# Patient Record
Sex: Female | Born: 1945 | Race: White | Hispanic: No | Smoking: Former smoker
Health system: Southern US, Community
[De-identification: ages and names within clinical notes are randomized; demographics above are authoritative.]

## PROBLEM LIST (undated history)

## (undated) DIAGNOSIS — K529 Noninfective gastroenteritis and colitis, unspecified: Secondary | ICD-10-CM

## (undated) DIAGNOSIS — I509 Heart failure, unspecified: Secondary | ICD-10-CM

## (undated) DIAGNOSIS — F329 Major depressive disorder, single episode, unspecified: Secondary | ICD-10-CM

## (undated) DIAGNOSIS — M1990 Unspecified osteoarthritis, unspecified site: Secondary | ICD-10-CM

## (undated) DIAGNOSIS — M810 Age-related osteoporosis without current pathological fracture: Secondary | ICD-10-CM

## (undated) DIAGNOSIS — F32A Depression, unspecified: Secondary | ICD-10-CM

## (undated) DIAGNOSIS — K219 Gastro-esophageal reflux disease without esophagitis: Secondary | ICD-10-CM

## (undated) DIAGNOSIS — T7840XA Allergy, unspecified, initial encounter: Secondary | ICD-10-CM

## (undated) DIAGNOSIS — J40 Bronchitis, not specified as acute or chronic: Secondary | ICD-10-CM

## (undated) DIAGNOSIS — Z79890 Hormone replacement therapy (postmenopausal): Secondary | ICD-10-CM

## (undated) DIAGNOSIS — A048 Other specified bacterial intestinal infections: Secondary | ICD-10-CM

## (undated) DIAGNOSIS — F419 Anxiety disorder, unspecified: Secondary | ICD-10-CM

## (undated) DIAGNOSIS — K7460 Unspecified cirrhosis of liver: Secondary | ICD-10-CM

## (undated) DIAGNOSIS — I10 Essential (primary) hypertension: Secondary | ICD-10-CM

## (undated) DIAGNOSIS — R011 Cardiac murmur, unspecified: Secondary | ICD-10-CM

## (undated) DIAGNOSIS — Z933 Colostomy status: Secondary | ICD-10-CM

## (undated) DIAGNOSIS — N189 Chronic kidney disease, unspecified: Secondary | ICD-10-CM

## (undated) DIAGNOSIS — Z87442 Personal history of urinary calculi: Secondary | ICD-10-CM

## (undated) HISTORY — DX: Cardiac murmur, unspecified: R01.1

## (undated) HISTORY — DX: Noninfective gastroenteritis and colitis, unspecified: K52.9

## (undated) HISTORY — DX: Allergy, unspecified, initial encounter: T78.40XA

## (undated) HISTORY — DX: Age-related osteoporosis without current pathological fracture: M81.0

## (undated) HISTORY — DX: Other specified bacterial intestinal infections: A04.8

## (undated) HISTORY — DX: Essential (primary) hypertension: I10

## (undated) HISTORY — DX: Major depressive disorder, single episode, unspecified: F32.9

## (undated) HISTORY — PX: GANGLION CYST EXCISION: SHX1691

## (undated) HISTORY — DX: Depression, unspecified: F32.A

## (undated) HISTORY — DX: Chronic kidney disease, unspecified: N18.9

## (undated) HISTORY — DX: Heart failure, unspecified: I50.9

## (undated) HISTORY — DX: Colostomy status: Z93.3

## (undated) HISTORY — PX: LIVER BIOPSY: SHX301

## (undated) HISTORY — DX: Gastro-esophageal reflux disease without esophagitis: K21.9

## (undated) HISTORY — DX: Anxiety disorder, unspecified: F41.9

---

## 2002-09-09 ENCOUNTER — Other Ambulatory Visit: Admission: RE | Admit: 2002-09-09 | Discharge: 2002-09-09 | Payer: Self-pay | Admitting: Gynecology

## 2002-11-18 ENCOUNTER — Encounter: Payer: Self-pay | Admitting: Family Medicine

## 2002-11-18 ENCOUNTER — Encounter: Admission: RE | Admit: 2002-11-18 | Discharge: 2002-11-18 | Payer: Self-pay | Admitting: Family Medicine

## 2003-01-04 ENCOUNTER — Encounter: Payer: Self-pay | Admitting: Emergency Medicine

## 2003-01-04 ENCOUNTER — Inpatient Hospital Stay (HOSPITAL_COMMUNITY): Admission: EM | Admit: 2003-01-04 | Discharge: 2003-01-06 | Payer: Self-pay | Admitting: Emergency Medicine

## 2003-01-05 ENCOUNTER — Encounter: Payer: Self-pay | Admitting: General Surgery

## 2003-01-06 ENCOUNTER — Encounter: Payer: Self-pay | Admitting: General Surgery

## 2003-09-29 ENCOUNTER — Other Ambulatory Visit: Admission: RE | Admit: 2003-09-29 | Discharge: 2003-09-29 | Payer: Self-pay | Admitting: Gynecology

## 2004-11-08 ENCOUNTER — Other Ambulatory Visit: Admission: RE | Admit: 2004-11-08 | Discharge: 2004-11-08 | Payer: Self-pay | Admitting: Gynecology

## 2004-11-28 HISTORY — PX: ABDOMINAL HYSTERECTOMY: SHX81

## 2004-12-21 ENCOUNTER — Encounter (INDEPENDENT_AMBULATORY_CARE_PROVIDER_SITE_OTHER): Payer: Self-pay | Admitting: *Deleted

## 2004-12-21 ENCOUNTER — Observation Stay (HOSPITAL_COMMUNITY): Admission: RE | Admit: 2004-12-21 | Discharge: 2004-12-22 | Payer: Self-pay | Admitting: Gynecology

## 2005-11-08 ENCOUNTER — Other Ambulatory Visit: Admission: RE | Admit: 2005-11-08 | Discharge: 2005-11-08 | Payer: Self-pay | Admitting: Gynecology

## 2005-12-05 ENCOUNTER — Ambulatory Visit: Payer: Self-pay | Admitting: Family Medicine

## 2006-02-21 ENCOUNTER — Ambulatory Visit: Payer: Self-pay | Admitting: Family Medicine

## 2006-02-24 ENCOUNTER — Ambulatory Visit: Payer: Self-pay | Admitting: Cardiovascular Disease

## 2006-02-27 ENCOUNTER — Ambulatory Visit: Payer: Self-pay | Admitting: Internal Medicine

## 2006-11-13 ENCOUNTER — Other Ambulatory Visit: Admission: RE | Admit: 2006-11-13 | Discharge: 2006-11-13 | Payer: Self-pay | Admitting: Gynecology

## 2008-03-13 ENCOUNTER — Ambulatory Visit (HOSPITAL_BASED_OUTPATIENT_CLINIC_OR_DEPARTMENT_OTHER): Admission: RE | Admit: 2008-03-13 | Discharge: 2008-03-13 | Payer: Self-pay | Admitting: Orthopedic Surgery

## 2008-08-01 ENCOUNTER — Encounter: Admission: RE | Admit: 2008-08-01 | Discharge: 2008-08-01 | Payer: Self-pay | Admitting: Cardiology

## 2008-11-28 HISTORY — PX: FOOT SURGERY: SHX648

## 2011-04-12 NOTE — Op Note (Signed)
NAME:  Brianna Duncan, AKKERMAN NO.:  [id number]   MEDICAL RECORD NO.:  [id number]          PATIENT TYPE:  AMB   LOCATION:  DSC                          FACILITY:  [facility]   PHYSICIAN:  Rodney A. Mortenson, M.D.DATE OF BIRTH:  1946-05-08   DATE OF PROCEDURE:  03/13/2008  DATE OF DISCHARGE:                               OPERATIVE REPORT   HISTORY:  A 65 year old female with progressive bunion deformity of the  right great toe ensuing discomfort and pain.  She also has painful  condyles over the medial border of the PIP joint of the second toe and  DIP joint of third toe with secondary hypertrophic callus formation and  pain and discomfort.  Because of the persistent pain and discomfort  which has not responded to conservative care, she is now admitted for  surgical correction.  Complications discussed preoperatively.  Questions  were answered extensively and the patient wished to proceed.  The  patient has had some previous surgery on the foot.   PREOPERATIVE DIAGNOSES:  Bunion, right great toe; hypertrophic condyle,  proximal interphalangeal joint of right second toe; distal  interphalangeal joint, right third toe on medial side.   POSTOPERATIVE DIAGNOSIS:  Bunion, right great toe; hypertrophic condyle,  proximal interphalangeal joint of right second toe; distal  interphalangeal joint, right third toe on medial side.   OPERATION:  Silver bunionectomy; condylectomy, medial side proximal  interphalangeal joint, right second toe, and medial border distal  interphalangeal joint, right third toe.   SURGEON:  Lenard Galloway. Chaney Malling, MD   ANESTHESIA:  General.   PROCEDURE:  The patient was placed on the operating table in supine  position.  Pneumatic tourniquet was wrapped around the right upper  thigh.  The right lower extremity was prepped with DuraPrep and draped  in the usual manner.  Leg was wrapped out and an Esmarch tourniquet was  elevated.  Incision was made over the  PIP joint of the second toe and  DIP joint of third toe on the medial side.  A small retractor was put in  place.  Sharp dissection was carried down to the hypertrophic condyle at  each level.  Sharp bone cutting rongeurs were then used and partial  condylectomy was done.  This was then debrided further with a small  rongeur.  Complete decompression of the hypertrophic condyle was done at  both levels.  The skin was then closed with 3-0 nylon.  Attention was  then turned to the great toe.  Incision was made over the dorsomedial  aspect of right great toe.  Skin edges were retracted.  Care was taken  to avoid injury to this superficial cutaneous nerves.  Blunt dissection  was carried down to the condyle on very large exostosis.  A distally-  based flap of the capsule and bursa was then done.  This was flipped  distally and this exposed a very large exostosis.  Using a power saw,  this was amputated.  This was followed with a smooth rongeur, which  debrided the edges and decompressed the exostosis further.  Once this  was accomplished to my satisfaction, the joint  was irrigated with  copious amounts saline solution to remove all bony debris.  The medial  capsule flap was then brought back in anatomic position.  The toe was  aligned in neutral and 2-0 Vicryl suture was used to close the capsule.  This realigned the toe very nicely and decompress the large exostosis.  The skin was then closed with running 0 nylon suture.  A large bulky  pressure dressing was applied and the patient was returned to the  recovery room in excellent condition.  Technically, this procedure went  extremely well.   DISPOSITION:  1. Percocet for pain.  2. Usual postop instructions.  3. Will be on bunion shoe.  Weightbearing as tolerated.  4. Return to my office next week for followup exam.      Thereasa Distance A. Chaney Malling, M.D.  Electronically Signed     RAM/MEDQ  D:  03/13/2008  T:  03/14/2008  Job:  [id number]

## 2011-04-15 NOTE — H&P (Signed)
NAME:  Brianna Duncan, Brianna Duncan                    ACCOUNT NO.:  [id number]   MEDICAL RECORD NO.:  [id number]                   PATIENT TYPE:  EMS   LOCATION:  MINO                                 FACILITY:  [facility]   PHYSICIAN:  Angelia Mould. Derrell Lolling, M.D.             DATE OF BIRTH:  [birth year]   DATE OF ADMISSION:  01/04/2003  DATE OF DISCHARGE:                                HISTORY & PHYSICAL   CHIEF COMPLAINT:  Left chest pain.   HISTORY OF PRESENT ILLNESS:  This is a 65 year old white femalemale who fell  yesterday morning.  She was leaving the porch of her house, carrying a dog,  and fell backwards onto her back and left chest wall.  She complained of  immediate left chest pain and shortness of breath.  The shortness of breath  has resolved, but severe pleuritic left chest pain has continued.  She also  complained of some bruising and soreness of her left forearm that is mild.  Because the pain continued, she went to [facility] today.  She had  a chest x-ray today which reportedly showed a 30% left pneumothorax and a  rib fracture and she was sent to the . [facility]  Emergency Room for further evaluation.   She denies head trauma or headache, visual change.  She denies neck pain,  back pain, hip pain, pelvic pain, or lower extremity pain.  She denies  palpitations and also denies abdominal pain, nausea, or vomiting.  Her only  complaint is severe pain in the left chest wall.   PAST MEDICAL HISTORY:  She has had some episodes of bronchitis in the past,  but nothing recently.  She had a mole removed from her left anterior chest  wall a few days ago by Tinnie Gens A. Tawanna Cooler, M.D.  She denies a history of  cardiac disease, diabetes, asthma, or liver disease.  She had never had any  surgery other than the mole removal.   CURRENT MEDICATIONS:  None.   DRUG ALLERGIES:  IVP DYE.   FAMILY HISTORY:  Her mother died of heart disease.  Her father died of a  stroke.   SOCIAL HISTORY:  The patient is single.  She quit smoking in 1998.  She  drinks about three mixed drinks every weekend.  Her last tetanus shot was  this year.   REVIEW OF SYSTEMS:  All systems are reviewed.  They are noncontributory,  except as described above.   PHYSICAL EXAMINATION:  GENERAL APPEARANCE:  A thin, pleasant, white female  in moderate distress from left chest wall pain.  VITAL SIGNS:  Pulse 86, blood pressure 171/59, respirations 16 and  unlabored, temperature 97.5 degrees, oxygen saturation 98-99% on room air.  HEENT:  The sclerae are clear.  Extraocular movements intact.  ENT:  Oropharynx clear.  Teeth intact.  No trauma.  No facial bone fracture,  ecchymoses, or swelling.  External auditory canals are clear.  NECK:  Supple and nontender.  She has good active and passive range of  motion without pain.  There is no tenderness posteriorly.  CHEST:  There is tenderness and a little bit of crepitance on the left  anterolateral chest wall about the 9th and 10th rib areas and anterior  axillary line area.  There is no subcutaneous emphysema.  There is no  ecchymosis or swelling.  The sternum and clavicles are nontender.  The right  chest wall is nontender.  BACK:  The thoracic and lumbar spine are nontender.  LUNGS:  Breath sounds are actually quite good on both sides, perhaps  slightly diminished in intensity on the left.  HEART:  Regular rate and rhythm.  No murmur.  ABDOMEN:  Soft, nontender.  Active bowel sounds.  There is no hernia noted.  The liver and spleen are not enlarged.  PELVIS:  Stable and nontender.  GENITALIA:  There is no meatal blood.  RECTAL:  No blood externally.  EXTREMITIES:  Free range of motion without deformity or swelling.  There is  some ecchymoses around the left elbow, but this is really not tender and she  has excellent range of motion there.  NEUROLOGIC:  She is oriented x 4.  She had good memory for the event.  She  has normal motor and  sensory exam in all four extremities.  Speech is  normal.  VASCULAR:  She has excellent carotid, radial, and dorsalis pedis pulses  bilaterally.   LABORATORY DATA:  A repeat chest x-ray here in the . [facility] ER shows a less than 5% left pneumothorax and a left 10th rib  fracture posteriorly.  The urinalysis is clear.  The rest of her laboratory  work is pending.   IMPRESSION:  1. Fall.  2. Left rib fracture (#10) and possible left costochondral fractures.  3. Left pneumothorax, resolving radiographically.  4. Contusion of left elbow.   PLAN:  1. The patient will be admitted for pain control and oxygen monitoring.  2. We will x-ray the left elbow today.  3. Will repeat a PA and lateral chest x-ray in the morning to make sure that     she does not have recurrence of her pneumothorax.  4. I do not see any indication for chest tube at this time.                                               Angelia Mould. Derrell Lolling, M.D.    HMI/MEDQ  D:  01/04/2003  T:  01/04/2003  Job:  [id number]   cc:   Tinnie Gens A. Tawanna Cooler, M.D. [facility]  [address] [place]  [place] [address]  Fax: 1

## 2011-04-15 NOTE — H&P (Signed)
NAME:  Brianna Duncan, Brianna Duncan NO.:  [id number]   MEDICAL RECORD NO.:  [id number]          PATIENT TYPE:  AMB   LOCATION:  DAY                          [facility]ACILITY[facility]   PHYSICIAN:  Gretta Cool, M.D. DATE OF BIRTH:  [birth year]   DATE OF ADMISSION:  12/21/2004  DATE OF DISCHARGE:                                HISTORY & PHYSICAL   Ms. Goodspeed is a 65 year old gravida 1, para 1, who has been given  compounded estrogen, testosterone and a progesterone topical cream.  She has  taken that medication for a prolonged period of time.  She now presents with  a history of abnormal uterine bleeding that does not correlate with the  progesterone cream or anything else.  In essence, she has been on unopposed  oral estrogen without effective progesterone.  She had endometrial sampling  for evaluation of the abnormal uterine bleeding when ultrasound revealed a  very thickened endometrial lining suspicious of endometrial pathology.  Her  endometrium was 2.1 cm thick.  The pathology revealed a florid complex  hyperplasia with atypia, in essence a precancerous change.  She is now  admitted for definitive therapy by total abdominal hysterectomy and  bilateral salpingo-oophorectomy.   PAST MEDICAL HISTORY:  Usual childhood disease without sequelae.   Previous history of D&C for abnormal genital cytology in 1975.  She denies  significant medical illnesses.   FAMILY HISTORY:  Father died at age 58 of stroke.  Mother died at age 55 of  heart disease.  Mother had hypertension.  One brother has had coronary  artery disease with bypass, also is hypertensive.  Father was also type 2  diabetic.   ALLERGIES:  The patient has allergy to IVP DYE.   PRESENT PREMEDICATION:  Compounded progesterone cream, testosterone and  triacetate-compounded estrogen at high dose from Dr. Zenda Alpers in [facility].  Also taking a number of over-the-counter nonregulated vitamin  supplements, etc.   HABITS:  Denies tobacco or significant alcohol.   REVIEW OF SYSTEMS:  HEENT:  Denies symptoms.  CARDIORESPIRATORY:  Denies  asthma, cough, bronchitis, shortness of breath.  GASTROINTESTINAL/GENITOURINARY:  Denies frequency, urgency, dysuria, change  in bowel habits, food intolerance.  She has a past history of smoking.  She  also has a history of mitral valve prolapse and has dental prophylaxis for  procedures recommended by her medical doctor.   PHYSICAL EXAMINATION:  GENERAL:  A generally well-developed, well-nourished,  thin white female.  She is down one inch in height from baseline.  HEENT:  Pupils equal and react to light and accommodate.  Fundi not  examined.  Oropharynx clear.  NECK:  Supple without masses or thyroid enlargement.  CHEST:  Clear P to A.  BREASTS:  Soft without mass, nodes or nipple discharge.  ABDOMEN:  Soft without mass or organomegaly.  PELVIC:  External genitalia normal female.  Vagina clean, rugous.  The  cervix is parous, clean.  Uterus normal size, shape, contour.  Adnexa are  clear.  Rectovaginal confirms.   IMPRESSION:  1.  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  2.  Mitral valve prolapse.  3.  Strong family history of cardiovascular disease, stroke and diabetes.   PLAN:  I have recommended on to definitive therapy by total abdominal  hysterectomy, pathologic examination of the uterus, salpingo-oophorectomy,  possible extended therapy and node sampling if there is more than complex  hyperplasia with atypia.      CWL/MEDQ  D:  12/16/2004  T:  12/16/2004  Job:  [id number]   cc:   Tinnie Gens A. Tawanna Cooler, M.D. [facility]   [facility]  [address]  [place]  [place] [address]  Fax: [phone]

## 2011-04-15 NOTE — Discharge Summary (Signed)
NAME:  Brianna Duncan, Brianna Duncan                    ACCOUNT NO.:  [id number]   MEDICAL RECORD NO.:  [id number]                   PATIENT TYPE:  INP   LOCATION:  5707                                 FACILITY:  [facility]   PHYSICIAN:  Jimmye Norman, M.D.                   DATE OF BIRTH:  [birth year]   DATE OF ADMISSION:  DATE OF DISCHARGE:                                 DISCHARGE SUMMARY   TRAUMA SURGEON:  Angelia Mould. Derrell Lolling, M.D.   PRIMARY CARE PHYSICIAN:  Tinnie Gens A. Tawanna Cooler, M.D.   DISCHARGE DIAGNOSES:  1. Status post fall.  2. Left #10 rib fracture and possible left costochondral fractures.  3. Left pneumothorax.  4. Contusion left elbow.   HISTORY OF ADMISSION:  This is a 65 year old white female who fell on  01/03/03.  She was apparently leaving the porch of her home carrying a dog and  fell backwards onto her back and the left side of her chest.  She complained  immediately of left-sided chest pain and shortness of breath.  She continued  to have pleuritic left-sided chest pain and went to an urgent medical care  on 01/04/03 secondary to this.  A chest x-ray was done and reportedly showed a  30% left pneumothorax as well as a left rib fracture, and she was referred  to [facility] for further evaluation.  She is hemodynamic stable on  presentation to the ED with a pulse of 86, blood pressure 171/59,  respirations 16 and unlabored.  She is afebrile.  Her oxygen saturation on  room air is 98 to 99%.   PHYSICAL EXAMINATION:  Physical examination was essentially normal except  for some minimal crepitance over the left anterior lateral chest wall and  tenderness over this area to palpation.  She has no subcutaneous emphysema,  and her lungs are clear.  A repeat chest x-ray on admission showed a 5% left  pneumothorax and a left 10th rib fracture posteriorly.   HOSPITAL COURSE:  The patient was admitted for pain control and oxygen  monitoring as well as serial chest x-ray.  It was not  felt that chest tube  placement was indicated at this time.  The patient continued to remain  stable, and chest x-ray on 01/05/03 may have show a slightly larger, perhaps  around 10% left pneumothorax.  Secondary to this slight enlargement, it was  recommended that she continued to be monitored.  Chest x-ray on the day of  discharge, on 01/06/03, showed stable small pneumothorax.  Oxygen saturation  on room air was 98 to 100%, and the patient was having adequate pain control  on oral medications.  She was ambulatory and taking a regular diet without  difficulty.  It was felt that she could be discharged home at this time.   DISCHARGE MEDICATIONS:  Vicodin one to two p.o. q.4-6h p.r.n. pain.    DISCHARGE INSTRUCTIONS:  1. Activities are to  tolerance.  2. No working or driving until seen in followup by trauma service on 2/17 at     9:15 a.m.     Shawn Rayburn, P.A.                       Jimmye Norman, M.D.    SR/MEDQ  D:  01/06/2003  T:  01/06/2003  Job:  [id number]   cc:   Tinnie Gens A. Tawanna Cooler, M.D. [facility]  [address] [place]  [place] [address]  Fax: 1   Jimmye Norman III, M.D.  [address]  [place]  [place] [address]  Fax: [phone]

## 2011-04-15 NOTE — Op Note (Signed)
NAME:  Brianna Duncan, Brianna Duncan NO.:  [id number]   MEDICAL RECORD NO.:  [id number]          PATIENT TYPE:  OBV   LOCATION:  0447                         [facility]ACILITY:  [facility]   PHYSICIAN:  Gretta Cool, M.D. DATE OF BIRTH:  1946-04-26   DATE OF PROCEDURE:  12/21/2004  DATE OF DISCHARGE:                                 OPERATIVE REPORT   PREOPERATIVE DIAGNOSIS:  Atypical complex endometrial hyperplasia.   POSTOPERATIVE DIAGNOSIS:  Atypical complex endometrial hyperplasia.   PROCEDURES:  1.  Exploratory laparotomy.  2.  Total abdominal hysterectomy.  3.  Bilateral salpingo-oophorectomy.   SURGEON:  Gretta Cool, M.D.   ASSISTANT:  Raynald Kemp, M.D.   ANESTHESIA:  General orotracheal.   DESCRIPTION OF PROCEDURE:  Under excellent general anesthesia with the  patient prepped and draped in lithotomy position with PAS stockings and her  bladder drained, a Pfannenstiel incision was made and extended through the  fascia.  The peritoneum was then opened and abdomen explored.  There were no  abnormalities identified in the upper abdomen.  There was some significant  lobulation of both kidneys, both normal size, and no periaortic nodes or  pelvic adenopathy.  Examination of the pelvis revealed her uterus to be  normal in size, somewhat large for her age.  Both ovaries were quite  atrophic.  No evidence of endometriosis or other pelvic pathology.  At this  point, a self-retaining retractor was placed in the abdomen and bowel packed  away.  The uterus was grasped with Kelly clamps and elevated.  The round  ligaments were then transected and the posterior leaf of the broad ligament  opened over the infundibulopelvic vessels.  The vessels were then clamped,  cut, suture and tied with 0 Vicryl.  The anterior peritoneum was then  dissected across the surface of the uterus and the adventitial structures  pushed off the lower uterine segment.  The uterine vessels were then  skeletonized and ureter identified.  The vessels then clamped, cut, sutured  and tied with 0 Vicryl.  The cardinal and uterosacral ligaments were then  progressively clamped, cut, sutured and tied with 0 Vicryl.  The vaginal  cuff was then incised and the cervix removed.  The vaginal cuff was then  closed with a running suture of 0 Vicryl.  Careful inspection of the  pedicles revealed no significant bleeding.  A cardinal uterosacral  colposuspension was performed using 2-0 Novofil.  The pelvic floor was then  reperitonealized with a running suture of 2-0 Monocryl.  The pelvic floor  was then irrigated and the retractors were removed.  The omentum was pulled  down over the pelvis.  The abdominoperitoneum was closed with running suture  of 0 Monocryl.  The rectus muscle was then approximated with a running  suture of 0 Vicryl  from each angle of the midline.  Subcutaneous tissues approximated with  interrupted sutures of 2-0 Vicryl and the skin closed with skin staples and  Steri-Strips.  At the end of the procedure, sponge and lap counts were  correct.  There were no complications.  The patient returned to the recovery  room in excellent condition.      CWL/MEDQ  D:  12/21/2004  T:  12/21/2004  Job:  [id number]   cc:   Tinnie Gens A. Tawanna Cooler, M.D. [facility], M.D.  [place], [place]

## 2011-08-23 LAB — POCT HEMOGLOBIN-HEMACUE: Hemoglobin: 14.8

## 2012-01-18 ENCOUNTER — Ambulatory Visit (INDEPENDENT_AMBULATORY_CARE_PROVIDER_SITE_OTHER): Payer: Medicare Other | Admitting: Family Medicine

## 2012-01-18 VITALS — BP 130/74 | HR 76 | Temp 98.0°F | Resp 18 | Ht 59.0 in | Wt 138.6 lb

## 2012-01-18 DIAGNOSIS — J0100 Acute maxillary sinusitis, unspecified: Secondary | ICD-10-CM

## 2012-01-18 DIAGNOSIS — I10 Essential (primary) hypertension: Secondary | ICD-10-CM

## 2012-01-18 DIAGNOSIS — J329 Chronic sinusitis, unspecified: Secondary | ICD-10-CM

## 2012-01-18 MED ORDER — AZITHROMYCIN 250 MG PO TABS: ORAL_TABLET | ORAL | Status: DC

## 2012-01-18 MED ORDER — HYDROCODONE-HOMATROPINE 5-1.5 MG/5ML PO SYRP: 5.0000 mL | ORAL_SOLUTION | Freq: Three times a day (TID) | ORAL | Status: AC | PRN

## 2012-01-18 MED ORDER — FLUTICASONE PROPIONATE 50 MCG/ACT NA SUSP: 2.0000 | Freq: Every day | NASAL | Status: DC

## 2012-01-18 NOTE — Progress Notes (Signed)
  Subjective:    Patient ID: Brianna Duncan, female    DOB: Apr 08, 1946, 66 y.o.   MRN: [id number]  HPI Patient presents with 5 day history of facial congestion, post nasal drainage and cough.  Persistant cough worse at night.  Cough non productive  Some chest tightness without wheezing, hoarseness  Tactile fever  Feeling better today   Review of Systems     Objective:   Physical Exam  HENT:  Mouth/Throat: Oropharyngeal exudate: purulent pnd   Neck: Neck supple.  Cardiovascular: Normal rate, regular rhythm and normal heart sounds.   Pulmonary/Chest: Effort normal. Wheezes: slightly coarse BS.  Lymphadenopathy:    Cervical adenopathy: shoddy  ant nodes.  Neurological: She is alert.  Skin: Skin is warm.  Psychiatric: She has a normal mood and affect.           Assessment & Plan:   1. Sinusitis   2. HTN (hypertension)    See medications Rx on AVS Anticipatory guidance

## 2012-01-19 ENCOUNTER — Other Ambulatory Visit: Payer: Self-pay | Admitting: Family Medicine

## 2012-04-10 ENCOUNTER — Ambulatory Visit (INDEPENDENT_AMBULATORY_CARE_PROVIDER_SITE_OTHER): Payer: Medicare Other | Admitting: Family Medicine

## 2012-04-10 VITALS — BP 126/73 | HR 74 | Temp 98.0°F | Resp 16 | Ht 58.5 in | Wt 144.4 lb

## 2012-04-10 DIAGNOSIS — Z23 Encounter for immunization: Secondary | ICD-10-CM

## 2012-04-10 DIAGNOSIS — T148XXA Other injury of unspecified body region, initial encounter: Secondary | ICD-10-CM

## 2012-04-10 DIAGNOSIS — M79673 Pain in unspecified foot: Secondary | ICD-10-CM

## 2012-04-10 DIAGNOSIS — M79609 Pain in unspecified limb: Secondary | ICD-10-CM

## 2012-04-10 DIAGNOSIS — IMO0002 Reserved for concepts with insufficient information to code with codable children: Secondary | ICD-10-CM

## 2012-04-10 NOTE — Progress Notes (Signed)
  Patient Name: Brianna Duncan Date of Birth: 05/10/1946 Medical Record Number: [id number] Gender: female Date of Encounter: 04/10/2012  History of Present Illness:  Brianna Duncan is a 66 y.o. very pleasant female patient who presents with the following:  Here today to evaluate left heel pain.  A couple of weeks ago she was walking across a wood floor and got a splinter in her heel.  Her daughter pulled out what they thought was the whole thing- she did feel better for several days but then about 3 or 4 days ago the pain returned.  Otherwise she is feeling ok.  She is not quite sure of the date of her last tetanus shot, but she thinks it was several years ago.    There is no problem list on file for this patient.  No past medical history on file. No past surgical history on file. History  Substance Use Topics  . Smoking status: Never Smoker   . Smokeless tobacco: Not on file  . Alcohol Use: Yes     occassionally   No family history on file. Allergies  Allergen Reactions  . Ivp Dye (Iodinated Diagnostic Agents) Hives    Medication list has been reviewed and updated.  Review of Systems: As per HPI- otherwise negative. No heat, redness, or other signs of infection  Physical Examination: Filed Vitals:   04/10/12 0840  BP: 126/73  Pulse: 74  Temp: 98 F (36.7 C)  TempSrc: Oral  Resp: 16  Height: 4' 10.5" (1.486 m)  Weight: 144 lb 6.4 oz (65.499 kg)    Body mass index is 29.67 kg/(m^2).   GEN: WDWN, NAD, Non-toxic, Alert & Oriented x 3 HEENT: Atraumatic, Normocephalic.  Ears and Nose: No external deformity. EXTR: No clubbing/cyanosis/edema NEURO: Normal gait.  PSYCH: Normally interactive. Conversant. Not depressed or anxious appearing.  Calm demeanor.  In the left heel there is a visible superficial splinter. Removed by scraping with a 15 blade after betadine prep.  No other problems such as redness or purulence noted.    Assessment and Plan: 1. Splinter   Tdap vaccine greater than or equal to 7yo IM  2. Pain in foot     Splinter- removed.  Went over signs and symptoms of infection- let us know if any of these occur  Update tetanus shot

## 2012-04-25 ENCOUNTER — Other Ambulatory Visit: Payer: Self-pay | Admitting: Physician Assistant

## 2012-05-25 ENCOUNTER — Other Ambulatory Visit: Payer: Self-pay | Admitting: Physician Assistant

## 2012-05-26 ENCOUNTER — Other Ambulatory Visit: Payer: Self-pay | Admitting: Physician Assistant

## 2012-06-08 ENCOUNTER — Telehealth: Payer: Self-pay

## 2012-06-08 MED ORDER — AMLODIPINE BESYLATE 10 MG PO TABS: 10.0000 mg | ORAL_TABLET | Freq: Every day | ORAL | Status: DC

## 2012-06-08 MED ORDER — LISINOPRIL-HYDROCHLOROTHIAZIDE 20-12.5 MG PO TABS: 1.0000 | ORAL_TABLET | Freq: Every day | ORAL | Status: DC

## 2012-06-08 NOTE — Telephone Encounter (Signed)
PT NOTIFIED THAT RXS WILL BE SENT IN AND TO MAKE SURE SHE KEEPS APPT

## 2012-06-08 NOTE — Telephone Encounter (Signed)
Patient requests refill auth for blood pressure medication, enough to last until appointment with Dr. Neva Seat on 06/22/12. She has 2 days worth left. Please call (H):[phone] or (W - daytime): [phone] to advise if can be done.

## 2012-06-22 ENCOUNTER — Ambulatory Visit: Payer: Medicare Other | Admitting: Family Medicine

## 2012-06-29 ENCOUNTER — Ambulatory Visit: Payer: Medicare Other | Admitting: Family Medicine

## 2012-07-06 ENCOUNTER — Ambulatory Visit: Payer: Medicare Other | Admitting: Family Medicine

## 2012-07-11 ENCOUNTER — Telehealth: Payer: Self-pay

## 2012-07-11 MED ORDER — AMLODIPINE BESYLATE 10 MG PO TABS: 10.0000 mg | ORAL_TABLET | Freq: Every day | ORAL | Status: DC

## 2012-07-11 MED ORDER — LISINOPRIL-HYDROCHLOROTHIAZIDE 20-12.5 MG PO TABS: 1.0000 | ORAL_TABLET | Freq: Every day | ORAL | Status: DC

## 2012-07-11 NOTE — Telephone Encounter (Signed)
PT STATES THAT SHE HAS AN APPT NEXT WEEK AND WILL RUN OUT OF HER RX FOR HER B/P IN 3 DAYS. PT WOULD LIKE TO KNOW IF WE COULD CALL HER IN ENOUGH B/P MEDICATION TO LAST HER UNTIL HER APPT. [phone] (WORK)

## 2012-07-11 NOTE — Telephone Encounter (Signed)
Called patient back left message for her to call me, I need to know which meds and which pharmacy

## 2012-07-11 NOTE — Telephone Encounter (Signed)
Needs Amlodipine 10mg  and Lisinopril/ HCTZ 20/12.5 to [facility], has appt coming up, if approved no need to advise patient, she is aware they will be sent.

## 2012-07-11 NOTE — Telephone Encounter (Signed)
Done and sent in 

## 2012-07-20 ENCOUNTER — Encounter: Payer: Self-pay | Admitting: Family Medicine

## 2012-07-20 ENCOUNTER — Ambulatory Visit (INDEPENDENT_AMBULATORY_CARE_PROVIDER_SITE_OTHER): Payer: Medicare Other | Admitting: Family Medicine

## 2012-07-20 VITALS — BP 127/77 | HR 104 | Temp 97.8°F | Resp 20 | Ht 58.5 in | Wt 143.0 lb

## 2012-07-20 DIAGNOSIS — J309 Allergic rhinitis, unspecified: Secondary | ICD-10-CM

## 2012-07-20 DIAGNOSIS — I10 Essential (primary) hypertension: Secondary | ICD-10-CM

## 2012-07-20 DIAGNOSIS — Z23 Encounter for immunization: Secondary | ICD-10-CM

## 2012-07-20 LAB — BASIC METABOLIC PANEL
BUN: 22 mg/dL (ref 6–23)
Calcium: 10.3 mg/dL (ref 8.4–10.5)
Creat: 0.87 mg/dL (ref 0.50–1.10)

## 2012-07-20 MED ORDER — AMLODIPINE BESYLATE 10 MG PO TABS: 10.0000 mg | ORAL_TABLET | Freq: Every day | ORAL | Status: DC

## 2012-07-20 MED ORDER — LISINOPRIL-HYDROCHLOROTHIAZIDE 20-12.5 MG PO TABS: 1.0000 | ORAL_TABLET | Freq: Every day | ORAL | Status: DC

## 2012-07-20 NOTE — Progress Notes (Signed)
  Subjective:    Patient ID: Brianna Duncan, female    DOB: [birth year], 66 y.o.   MRN: [id number]  HPI Brianna Duncan is a 66 y.o. female  HTN - not checking home blood pressure readings.  No new side effects with meds.  Weight has increased with diet - less exercise recently.   Still some sinus pressure at times - improves with claritin D - has not taken flonase ns.   Would like a rx for zostavax. - has not had shingles. No vaccine   Not fasting - last ate at 1pm.   Review of Systems  Constitutional: Negative for fatigue.  Respiratory: Negative for chest tightness and shortness of breath.   Cardiovascular: Negative for chest pain, palpitations and leg swelling.  Gastrointestinal: Negative for abdominal pain and blood in stool.  Neurological: Negative for dizziness, syncope, light-headedness and headaches.       Objective:   Physical Exam  Constitutional: She is oriented to person, place, and time. She appears well-developed and well-nourished.  HENT:  Head: Normocephalic and atraumatic.  Eyes: Conjunctivae and EOM are normal. Pupils are equal, round, and reactive to light.  Neck: Carotid bruit is not present.  Cardiovascular: Normal rate, regular rhythm, normal heart sounds and intact distal pulses.   Pulmonary/Chest: Effort normal and breath sounds normal.  Abdominal: Soft. She exhibits no pulsatile midline mass. There is no tenderness.  Neurological: She is alert and oriented to person, place, and time.  Skin: Skin is warm and dry.  Psychiatric: She has a normal mood and affect. Her behavior is normal.          Assessment & Plan:  Brianna Duncan is a 66 y.o. female 1. HTN (hypertension)  Basic metabolic panel, amLODipine (NORVASC) 10 MG tablet, lisinopril-hydrochlorothiazide (PRINZIDE,ZESTORETIC) 20-12.5 MG per tablet  2. Allergic rhinitis    3. Need for shingles vaccine      HTN - discussed watching diet, increasing activity - pool or exercise bike  discussed. Check BMP. Cont same meds for now.   AR - restart flonase.  Avoid decongestants.  rtc if not improving.  Can try zyrtec or allegra in place of claritin if no relief.   Instructed to schedule welcome to medicare physical - fasting then for lipids.   Paper rx for Zostavax given for [facility] to give her this..  Pt will be scheduling follow up for colonoscopy (last one 5 years ago - due for recheck), and for pap testing- OBGYN - Pamelia Hoit at Dr. Johnn Hai office - will discuss need for continued pap testing.  She will be scheduling mammogram.   Patient Instructions  Your should receive a call or letter about your lab results within the next week to 10 days.  Call to schedule a welcome to medicare physical in the next 6 months. Be fasting for that visit so we can check cholesterol if needed. Stop decongestant, but can continue claritin, zyrtec, or allegra and restart flonase.  If not improving symptoms - recheck. Return to the clinic or go to the nearest emergency room if any of your symptoms worsen or new symptoms occur.

## 2012-07-20 NOTE — Progress Notes (Signed)
  Subjective:    Patient ID: Brianna Duncan, female    DOB: 1946-01-10, 66 y.o.   MRN: [id number]  HPI    Review of Systems     Objective:   Physical Exam        Assessment & Plan:  See last progress note.  Will likely need to schedule "Annual Wellness Visit" - "initial", not Welcome to [facility] Physical.

## 2012-07-20 NOTE — Patient Instructions (Signed)
Your should receive a call or letter about your lab results within the next week to 10 days.  Call to schedule a welcome to medicare physical in the next 6 months. Be fasting for that visit so we can check cholesterol if needed. Stop decongestant, but can continue claritin, zyrtec, or allegra and restart flonase.  If not improving symptoms - recheck. Return to the clinic or go to the nearest emergency room if any of your symptoms worsen or new symptoms occur.

## 2012-08-09 ENCOUNTER — Other Ambulatory Visit: Payer: Self-pay | Admitting: Gynecology

## 2013-02-14 ENCOUNTER — Other Ambulatory Visit: Payer: Self-pay | Admitting: Family Medicine

## 2013-02-25 ENCOUNTER — Ambulatory Visit (INDEPENDENT_AMBULATORY_CARE_PROVIDER_SITE_OTHER): Payer: Medicare Other | Admitting: Family Medicine

## 2013-02-25 VITALS — BP 122/74 | HR 72 | Temp 97.9°F | Resp 16 | Ht 59.0 in | Wt 150.0 lb

## 2013-02-25 DIAGNOSIS — J209 Acute bronchitis, unspecified: Secondary | ICD-10-CM

## 2013-02-25 DIAGNOSIS — J329 Chronic sinusitis, unspecified: Secondary | ICD-10-CM

## 2013-02-25 DIAGNOSIS — H1130 Conjunctival hemorrhage, unspecified eye: Secondary | ICD-10-CM

## 2013-02-25 DIAGNOSIS — R05 Cough: Secondary | ICD-10-CM

## 2013-02-25 DIAGNOSIS — H1131 Conjunctival hemorrhage, right eye: Secondary | ICD-10-CM

## 2013-02-25 DIAGNOSIS — I10 Essential (primary) hypertension: Secondary | ICD-10-CM

## 2013-02-25 MED ORDER — CEFDINIR 300 MG PO CAPS: 300.0000 mg | ORAL_CAPSULE | Freq: Two times a day (BID) | ORAL | Status: DC

## 2013-02-25 NOTE — Progress Notes (Signed)
[facility] [address] [place] [address] [phone]  Date:  02/25/2013   Name:  FREDERICK KLINGER   DOB:  04-17-1946   MRN:  [id number]  PCP:  No primary provider on file.    Chief Complaint: Eye Problem and Nasal Congestion   History of Present Illness:  ALIXANDREA MILLESON is a 67 y.o. very pleasant female patient who presents with the following:  Saturday am she awoke with a large "blood spot" in her right eye. For the last 2 weeks she has noted "sinus and cold."  She has been coughing a lot. She still has a lot of chest congestion, and did have headaches.  She has noted sinus pressure, the back of her neck is sore.  She is blowing a lot of congestion out of her nose.   She has used some sudafed.   Her right eye feels ok, no eye pain, vision is ok and unchanged.  No discharge or crusting from the eye.  She is wearing her contacts right now, she also has glasses  She may have noted a fever at the start of this illness, but this is now better.  No GI sympoms, she is able to eat normally   Her PCP is Dr. Silas [place], she also see an OBG  Patient Active Problem List  Diagnosis  . Unspecified essential hypertension    Past Medical History  Diagnosis Date  . Hypertension     Past Surgical History  Procedure Laterality Date  . Abdominal hysterectomy      History  Substance Use Topics  . Smoking status: Never Smoker   . Smokeless tobacco: Never Used  . Alcohol Use: Yes     Comment: occassionally    Family History  Problem Relation Age of Onset  . Heart disease Mother   . Stroke Father   . Stroke Sister   . Heart disease Brother     Allergies  Allergen Reactions  . Ivp Dye (Iodinated Diagnostic Agents) Hives    Medication list has been reviewed and updated.  Current Outpatient Prescriptions on File Prior to Visit  Medication Sig Dispense Refill  . ALPRAZolam (XANAX) 0.5 MG tablet Take 0.5 mg by mouth at bedtime as needed.       Marland Kitchen amLODipine (NORVASC) 10 MG tablet TAKE ONE TABLET BY MOUTH DAILY  90 tablet  0  . estradiol (VIVELLE-DOT) 0.075 MG/24HR Place 1 patch onto the skin 2 (two) times a week.      Marland Kitchen glucosamine-chondroitin 500-400 MG tablet Take 2 tablets by mouth once.      Marland Kitchen lisinopril-hydrochlorothiazide (PRINZIDE,ZESTORETIC) 20-12.5 MG per tablet TAKE ONE TABLET BY MOUTH DAILY  90 tablet  0  . Testosterone Propionate 2 % CREA Place onto the skin as needed.      . therapeutic multivitamin-minerals (THERAGRAN-M) tablet Take 1 tablet by mouth daily.      Marland Kitchen b complex vitamins tablet Take 1 tablet by mouth daily.      . calcium citrate-vitamin D 200-200 MG-UNIT TABS Take 1 tablet by mouth daily.      . fluticasone (FLONASE) 50 MCG/ACT nasal spray Place 2 sprays into the nose daily.  1 g  6   No current facility-administered medications on file prior to visit.    Review of Systems:  As per HPI- otherwise negative.   Physical Examination: Filed Vitals:   02/25/13 0908  BP: 122/74  Pulse: 72  Temp: 97.9 F (36.6 C)  Resp: 16   Filed Vitals:   02/25/13 0908  Height: 4\' 11"  (1.499 m)  Weight: 150 lb (68.04 kg)   Body mass index is 30.28 kg/(m^2). Ideal Body Weight: Weight in (lb) to have BMI = 25: 123.5  GEN: WDWN, NAD, Non-toxic, A & O x 3 HEENT: Atraumatic, Normocephalic. Neck supple. No masses, No LAD. Bilateral TM wnl, oropharynx normal.  PEERL,EOMI.   Nasal cavity is congested, sinuses are tender to percussion Right eye; there is a sunconjuctival hemorrhage around her iris, both below and above the iris. Normal eye movements, pupils are reactive, fundoscopic exam is normal Ears and Nose: No external deformity. CV: RRR, No M/G/R. No JVD. No thrill. No extra heart sounds. PULM: CTA B, no wheezes, crackles, rhonchi. No retractions. No resp. distress. No accessory muscle use. EXTR: No c/c/e NEURO Normal gait.  PSYCH: Normally interactive. Conversant. Not depressed or anxious appearing.  Calm  demeanor.    Assessment and Plan: Sinusitis - Plan: cefdinir (OMNICEF) 300 MG capsule  Unspecified essential hypertension  Acute bronchitis - Plan: cefdinir (OMNICEF) 300 MG capsule  Subconjunctival hemorrhage, right  Cough  HTN is controlled. Suspect subconjunctival hemorrhage is due to coughing. Reassurance and hand- out given.  Gave omnicef for bronchitis/ sinusitis.  See patient instructions for more details.     Signed Abbe Amsterdam, MD

## 2013-02-25 NOTE — Patient Instructions (Addendum)
Use delsym as needed for cough, and the omnicef antibiotic for your sinuses and bronchitis.  If you have any problems with your vision or any eye pain please let us know right away.  Otherwise, let me know if your cough is not improving in the next week or so.   Subconjunctival Hemorrhage A subconjunctival hemorrhage is a bright red patch covering a portion of the white of the eye. The white part of the eye is called the sclera, and it is covered by a thin membrane called the conjunctiva. This membrane is clear, except for tiny blood vessels that you can see with the naked eye. When your eye is irritated or inflamed and becomes red, it is because the vessels in the conjunctiva are swollen. Sometimes, a blood vessel in the conjunctiva can break and bleed. When this occurs, the blood builds up between the conjunctiva and the sclera, and spreads out to create a red area. The red spot may be very small at first. It may then spread to cover a larger part of the surface of the eye, or even all of the visible white part of the eye. In almost all cases, the blood will go away and the eye will become white again. Before completely dissolving, however, the red area may spread. It may also become brownish-yellow in color, before going away. If a lot of blood collects under the conjunctiva, it may look like a bulge on the surface of the eye. This looks scary, but it will also eventually flatten out and go away. Subconjunctival hemorrhages do not cause pain, but if swollen, may cause a feeling of irritation. There is no effect on vision.  CAUSES   The most common cause is mild trauma (rubbing the eye, irritation).  Subconjunctival hemorrhages can happen because of coughing or straining (lifting heavy objects), vomiting, or sneezing.  In some cases, your doctor may want to check your blood pressure. High blood pressure can also cause a sunconjunctival hemorrhage.  Severe trauma or blunt injuries.  Diseases that  affect blood clotting (hemophilia, leukemia).  Abnormalities of blood vessels behind the eye (carotid cavernous sinus fistula).  Tumors behind the eye.  Certain drugs (aspirin, coumadin, heparin).  Recent eye surgery. HOME CARE INSTRUCTIONS   Do not worry about the appearance of your eye. You may continue your usual activities.  Often, follow-up is not necessary. SEEK MEDICAL CARE IF:   Your eye becomes painful.  The bleeding does not disappear within 3 weeks.  Bleeding occurs elsewhere, for example, under the skin, in the mouth, or in the other eye.  You have recurring subconjunctival hemorrhages. SEEK IMMEDIATE MEDICAL CARE IF:   Your vision changes or you have difficulty seeing.  You develop severe headache, persistent vomiting, confusion, or abnormal drowsiness (lethargy).  Your eye seems to bulge or protrude from the eye socket.  You notice the sudden appearance of bruises, or have spontaneous bleeding elsewhere on your body. Document Released: 11/14/2005 Document Revised: 02/06/2012 Document Reviewed: 10/12/2009 [facility] Patient Information 2013 [place], [place].

## 2013-03-14 ENCOUNTER — Other Ambulatory Visit: Payer: Self-pay | Admitting: Family Medicine

## 2013-03-15 ENCOUNTER — Other Ambulatory Visit: Payer: Self-pay | Admitting: Physician Assistant

## 2013-05-11 ENCOUNTER — Other Ambulatory Visit: Payer: Self-pay | Admitting: Physician Assistant

## 2013-06-24 ENCOUNTER — Ambulatory Visit: Payer: Medicare Other

## 2013-06-24 ENCOUNTER — Ambulatory Visit (INDEPENDENT_AMBULATORY_CARE_PROVIDER_SITE_OTHER): Payer: Medicare Other | Admitting: Family Medicine

## 2013-06-24 VITALS — BP 136/78 | HR 103 | Temp 98.0°F | Resp 18 | Ht <= 58 in | Wt 149.0 lb

## 2013-06-24 DIAGNOSIS — R059 Cough, unspecified: Secondary | ICD-10-CM

## 2013-06-24 DIAGNOSIS — J189 Pneumonia, unspecified organism: Secondary | ICD-10-CM

## 2013-06-24 DIAGNOSIS — R0602 Shortness of breath: Secondary | ICD-10-CM

## 2013-06-24 DIAGNOSIS — R05 Cough: Secondary | ICD-10-CM

## 2013-06-24 DIAGNOSIS — R5381 Other malaise: Secondary | ICD-10-CM

## 2013-06-24 LAB — POCT CBC
HCT, POC: 41.6 % (ref 37.7–47.9)
Hemoglobin: 13.3 g/dL (ref 12.2–16.2)
Lymph, poc: 2.6 (ref 0.6–3.4)
MCH, POC: 32.2 pg — AB (ref 27–31.2)
MCHC: 32 g/dL (ref 31.8–35.4)
MCV: 100.8 fL — AB (ref 80–97)
POC Granulocyte: 15.7 — AB (ref 2–6.9)
WBC: 19.6 10*3/uL — AB (ref 4.6–10.2)

## 2013-06-24 MED ORDER — LEVOFLOXACIN 500 MG PO TABS: 500.0000 mg | ORAL_TABLET | Freq: Every day | ORAL | Status: DC

## 2013-06-24 NOTE — Progress Notes (Signed)
Subjective:    Patient ID: Brianna Duncan, female    DOB: August 22, 1946, 67 y.o.   MRN: [id number]  HPI Brianna Duncan is a 67 y.o. female  Croupy cough at night -past few weeks, feels like not taking deeper breath at night. Feels some drainage back of throat. Fatigue with this, and has been busy.  Felt nausea last night, dizzy once last night - funny feeling, improved with lying down. No chest pain, palpitations, slurred speech or focal weakness. No fever, but had some chills last night, one episode of sweats last night. No wt loss.  Dry cough. Slight heartburn - helps with nexium - only taken twice - last dose this am. Has had allergy symptoms this year - pnd, and pressure in head. Has had relief of this prior with claritin D. Has had some exertion with walking, but longstanding with weight gain.    other tx: delsym.    Treated in March with Omnicef for bronchitis.   Treated with Xanax prior by Dr. Johnn Hai office, but he is retiring - new Obgyn wants that medicine prescribed by primary provider. Takes xanax for anxiety only as needed - less than once per week. No other anxiety meds.  Has been on this medicine for years, no recent change in dose.   Hx of heart murmur - s/p cath with no blockages a few years.   Review of Systems  Constitutional: Positive for chills and fatigue. Negative for fever (subjective. ).  HENT: Positive for nosebleeds (yesterday. ) and postnasal drip. Negative for congestion.   Respiratory: Positive for cough. Negative for shortness of breath (feels like dosen't have full breath - for weeks. ).   Cardiovascular: Negative for chest pain, palpitations and leg swelling.  Neurological: Negative for syncope, facial asymmetry, speech difficulty and weakness.       Objective:   Physical Exam  Vitals reviewed. Constitutional: She is oriented to person, place, and time. She appears well-developed and well-nourished. No distress.  HENT:  Head: Normocephalic and  atraumatic.  Right Ear: Hearing, tympanic membrane, external ear and ear canal normal.  Left Ear: Hearing, tympanic membrane, external ear and ear canal normal.  Nose: Nose normal.  Mouth/Throat: Oropharynx is clear and moist. No oropharyngeal exudate.  Eyes: Conjunctivae and EOM are normal. Pupils are equal, round, and reactive to light.  Cardiovascular: Normal rate, regular rhythm, normal heart sounds and intact distal pulses.   No murmur heard. Pulmonary/Chest: Effort normal. No respiratory distress. She has no decreased breath sounds. She has no wheezes.    Neurological: She is alert and oriented to person, place, and time.  Skin: Skin is warm and dry. No rash noted.  Psychiatric: She has a normal mood and affect. Her behavior is normal.     [facility] reading (PRIMARY) by  Dr. Neva Seat: CXR: increased basilar mrkings - R greater than left with RLL infiltrate.  Stat overread of XRay: Findings: Opacity at the right lung base may represent early  pneumonia. There is scarring/atelectasis at the left lung base.  No edema or pleural fluid is seen. Heart size and mediastinal  contours are within normal limits.  IMPRESSION:  Opacity at the right lung base is suspicious for early pneumonia.   Results for orders placed in visit on 06/24/13  POCT CBC      Result Value Range   WBC 19.6 (*) 4.6 - 10.2 K/uL   Lymph, poc 2.6  0.6 - 3.4   POC LYMPH PERCENT 13.2  10 -  50 %L   MID (cbc) 1.3 (*) 0 - 0.9   POC MID % 6.7  0 - 12 %M   POC Granulocyte 15.7 (*) 2 - 6.9   Granulocyte percent 80.1 (*) 37 - 80 %G   RBC 4.13  4.04 - 5.48 M/uL   Hemoglobin 13.3  12.2 - 16.2 g/dL   HCT, POC 16.1  09.6 - 47.9 %   MCV 100.8 (*) 80 - 97 fL   MCH, POC 32.2 (*) 27 - 31.2 pg   MCHC 32.0  31.8 - 35.4 g/dL   RDW, POC 04.5     Platelet Count, POC 339  142 - 424 K/uL   MPV 9.5  0 - 99.8 fL       Assessment & Plan:  Brianna Duncan is a 67 y.o. female  Nocturnal cough, with leukocytosis. Progressive  fatigue. Suspected early RLL pna. Start levaquin 500mg  qd, recheck in 48 hours.  mucinex for cough.  Ok to take claritin otc for allergies/pnd.  Rtc/er precautions discussed.   Will check BNP with DOE/shortness of breath, but less likely CHF.   Meds ordered this encounter  Medications  . levofloxacin (LEVAQUIN) 500 MG tablet    Sig: Take 1 tablet (500 mg total) by mouth daily.    Dispense:  10 tablet    Refill:  0   Patient Instructions  Start antibiotic - if any new side effects such as change in mental status - return to discuss and consider change of medicine. Ok to take claritin and mucinex over the counter. Recheck in 2 days - after 5pm on Wednesday. Return to the clinic or go to the nearest emergency room if any of your symptoms worsen or new symptoms occur. Pneumonia, Adult Pneumonia is an infection of the lungs.  CAUSES Pneumonia may be caused by bacteria or a virus. Usually, these infections are caused by breathing infectious particles into the lungs (respiratory tract). SYMPTOMS   Cough.  Fever.  Chest pain.  Increased rate of breathing.  Wheezing.  Mucus production. DIAGNOSIS  If you have the common symptoms of pneumonia, your caregiver will typically confirm the diagnosis with a chest X-ray. The X-ray will show an abnormality in the lung (pulmonary infiltrate) if you have pneumonia. Other tests of your blood, urine, or sputum may be done to find the specific cause of your pneumonia. Your caregiver may also do tests (blood gases or pulse oximetry) to see how well your lungs are working. TREATMENT  Some forms of pneumonia may be spread to other people when you cough or sneeze. You may be asked to wear a mask before and during your exam. Pneumonia that is caused by bacteria is treated with antibiotic medicine. Pneumonia that is caused by the influenza virus may be treated with an antiviral medicine. Most other viral infections must run their course. These infections will  not respond to antibiotics.  PREVENTION A pneumococcal shot (vaccine) is available to prevent a common bacterial cause of pneumonia. This is usually suggested for:  People over 80 years old.  Patients on chemotherapy.  People with chronic lung problems, such as bronchitis or emphysema.  People with immune system problems. If you are over 65 or have a high risk condition, you may receive the pneumococcal vaccine if you have not received it before. In some countries, a routine influenza vaccine is also recommended. This vaccine can help prevent some cases of pneumonia.You may be offered the influenza vaccine as part of your care.  If you smoke, it is time to quit. You may receive instructions on how to stop smoking. Your caregiver can provide medicines and counseling to help you quit. HOME CARE INSTRUCTIONS   Cough suppressants may be used if you are losing too much rest. However, coughing protects you by clearing your lungs. You should avoid using cough suppressants if you can.  Your caregiver may have prescribed medicine if he or she thinks your pneumonia is caused by a bacteria or influenza. Finish your medicine even if you start to feel better.  Your caregiver may also prescribe an expectorant. This loosens the mucus to be coughed up.  Only take over-the-counter or prescription medicines for pain, discomfort, or fever as directed by your caregiver.  Do not smoke. Smoking is a common cause of bronchitis and can contribute to pneumonia. If you are a smoker and continue to smoke, your cough may last several weeks after your pneumonia has cleared.  A cold steam vaporizer or humidifier in your room or home may help loosen mucus.  Coughing is often worse at night. Sleeping in a semi-upright position in a recliner or using a couple pillows under your head will help with this.  Get rest as you feel it is needed. Your body will usually let you know when you need to rest. SEEK IMMEDIATE MEDICAL  CARE IF:   Your illness becomes worse. This is especially true if you are elderly or weakened from any other disease.  You cannot control your cough with suppressants and are losing sleep.  You begin coughing up blood.  You develop pain which is getting worse or is uncontrolled with medicines.  You have a fever.  Any of the symptoms which initially brought you in for treatment are getting worse rather than better.  You develop shortness of breath or chest pain. MAKE SURE YOU:   Understand these instructions.  Will watch your condition.  Will get help right away if you are not doing well or get worse. Document Released: 11/14/2005 Document Revised: 02/06/2012 Document Reviewed: 02/03/2011 [facility] Patient Information 2014 [place], [place].

## 2013-06-24 NOTE — Patient Instructions (Signed)
Start antibiotic - if any new side effects such as change in mental status - return to discuss and consider change of medicine. Ok to take claritin and mucinex over the counter. Recheck in 2 days - after 5pm on Wednesday. Return to the clinic or go to the nearest emergency room if any of your symptoms worsen or new symptoms occur. Pneumonia, Adult Pneumonia is an infection of the lungs.  CAUSES Pneumonia may be caused by bacteria or a virus. Usually, these infections are caused by breathing infectious particles into the lungs (respiratory tract). SYMPTOMS   Cough.  Fever.  Chest pain.  Increased rate of breathing.  Wheezing.  Mucus production. DIAGNOSIS  If you have the common symptoms of pneumonia, your caregiver will typically confirm the diagnosis with a chest X-ray. The X-ray will show an abnormality in the lung (pulmonary infiltrate) if you have pneumonia. Other tests of your blood, urine, or sputum may be done to find the specific cause of your pneumonia. Your caregiver may also do tests (blood gases or pulse oximetry) to see how well your lungs are working. TREATMENT  Some forms of pneumonia may be spread to other people when you cough or sneeze. You may be asked to wear a mask before and during your exam. Pneumonia that is caused by bacteria is treated with antibiotic medicine. Pneumonia that is caused by the influenza virus may be treated with an antiviral medicine. Most other viral infections must run their course. These infections will not respond to antibiotics.  PREVENTION A pneumococcal shot (vaccine) is available to prevent a common bacterial cause of pneumonia. This is usually suggested for:  People over 81 years old.  Patients on chemotherapy.  People with chronic lung problems, such as bronchitis or emphysema.  People with immune system problems. If you are over 65 or have a high risk condition, you may receive the pneumococcal vaccine if you have not received it  before. In some countries, a routine influenza vaccine is also recommended. This vaccine can help prevent some cases of pneumonia.You may be offered the influenza vaccine as part of your care. If you smoke, it is time to quit. You may receive instructions on how to stop smoking. Your caregiver can provide medicines and counseling to help you quit. HOME CARE INSTRUCTIONS   Cough suppressants may be used if you are losing too much rest. However, coughing protects you by clearing your lungs. You should avoid using cough suppressants if you can.  Your caregiver may have prescribed medicine if he or she thinks your pneumonia is caused by a bacteria or influenza. Finish your medicine even if you start to feel better.  Your caregiver may also prescribe an expectorant. This loosens the mucus to be coughed up.  Only take over-the-counter or prescription medicines for pain, discomfort, or fever as directed by your caregiver.  Do not smoke. Smoking is a common cause of bronchitis and can contribute to pneumonia. If you are a smoker and continue to smoke, your cough may last several weeks after your pneumonia has cleared.  A cold steam vaporizer or humidifier in your room or home may help loosen mucus.  Coughing is often worse at night. Sleeping in a semi-upright position in a recliner or using a couple pillows under your head will help with this.  Get rest as you feel it is needed. Your body will usually let you know when you need to rest. SEEK IMMEDIATE MEDICAL CARE IF:   Your illness becomes worse.  This is especially true if you are elderly or weakened from any other disease.  You cannot control your cough with suppressants and are losing sleep.  You begin coughing up blood.  You develop pain which is getting worse or is uncontrolled with medicines.  You have a fever.  Any of the symptoms which initially brought you in for treatment are getting worse rather than better.  You develop  shortness of breath or chest pain. MAKE SURE YOU:   Understand these instructions.  Will watch your condition.  Will get help right away if you are not doing well or get worse. Document Released: 11/14/2005 Document Revised: 02/06/2012 Document Reviewed: 02/03/2011 [facility] Patient Information 2014 [place], [place].

## 2013-06-26 ENCOUNTER — Ambulatory Visit (INDEPENDENT_AMBULATORY_CARE_PROVIDER_SITE_OTHER): Payer: Medicare Other | Admitting: Family Medicine

## 2013-06-26 VITALS — BP 130/72 | HR 95 | Temp 98.5°F | Resp 18 | Ht 60.0 in | Wt 149.0 lb

## 2013-06-26 DIAGNOSIS — F411 Generalized anxiety disorder: Secondary | ICD-10-CM

## 2013-06-26 DIAGNOSIS — J189 Pneumonia, unspecified organism: Secondary | ICD-10-CM

## 2013-06-26 LAB — POCT CBC
Granulocyte percent: 72.9 %G (ref 37–80)
MCV: 101.1 fL — AB (ref 80–97)
MID (cbc): 1.1 — AB (ref 0–0.9)
POC LYMPH PERCENT: 18.3 %L (ref 10–50)
POC MID %: 8.8 %M (ref 0–12)
Platelet Count, POC: 343 10*3/uL (ref 142–424)
RDW, POC: 13.8 %

## 2013-06-26 LAB — BRAIN NATRIURETIC PEPTIDE: Brain Natriuretic Peptide: 12.4 pg/mL (ref 0.0–100.0)

## 2013-06-26 MED ORDER — ALPRAZOLAM 0.5 MG PO TABS: 0.5000 mg | ORAL_TABLET | Freq: Every evening | ORAL | Status: DC | PRN

## 2013-06-26 NOTE — Progress Notes (Addendum)
Subjective:    Patient ID: Brianna Duncan, female    DOB: [birth year], 67 y.o.   MRN: [id number]  HPI Brianna Duncan is a 67 y.o. female  See ov 2 days ago - diagnosed with pneumonia - RLL,  Elevated WBC as below. S/p 2 doses of Levaquin - feeling a little nervousness, a little strange, slightly woozy yesterday, less woozy today.  Resting. Has taken Delsym - last taken yesterday, not needed for cough today. No fever/chills/rigors.  Still a dry cough. Eating a little less, but drinking ok.   Results for orders placed in visit on 06/24/13  BRAIN NATRIURETIC PEPTIDE      Result Value Range   Brain Natriuretic Peptide 12.4  0.0 - 100.0 pg/mL  POCT CBC      Result Value Range   WBC 19.6 (*) 4.6 - 10.2 K/uL   Lymph, poc 2.6  0.6 - 3.4   POC LYMPH PERCENT 13.2  10 - 50 %L   MID (cbc) 1.3 (*) 0 - 0.9   POC MID % 6.7  0 - 12 %M   POC Granulocyte 15.7 (*) 2 - 6.9   Granulocyte percent 80.1 (*) 37 - 80 %G   RBC 4.13  4.04 - 5.48 M/uL   Hemoglobin 13.3  12.2 - 16.2 g/dL   HCT, POC 13.0  86.5 - 47.9 %   MCV 100.8 (*) 80 - 97 fL   MCH, POC 32.2 (*) 27 - 31.2 pg   MCHC 32.0  31.8 - 35.4 g/dL   RDW, POC 78.4     Platelet Count, POC 339  142 - 424 K/uL   MPV 9.5  0 - 99.8 fL    See last ov re intermittent use of xanax.     Review of Systems  Respiratory: Positive for cough. Negative for shortness of breath (none at rest, but with exertion -  no interval worsening. ).   Cardiovascular: Negative for chest pain.       Objective:   Physical Exam  Vitals reviewed. Constitutional: She is oriented to person, place, and time. She appears well-developed and well-nourished. No distress.  HENT:  Head: Normocephalic and atraumatic.  Right Ear: Hearing, tympanic membrane, external ear and ear canal normal.  Left Ear: Hearing, tympanic membrane, external ear and ear canal normal.  Nose: Nose normal.  Mouth/Throat: Oropharynx is clear and moist. No oropharyngeal exudate.  Eyes:  Conjunctivae and EOM are normal. Pupils are equal, round, and reactive to light.  Cardiovascular: Normal rate, regular rhythm, normal heart sounds and intact distal pulses.   No murmur heard. Pulmonary/Chest: Not tachypneic. No respiratory distress. She has no wheezes. She has rhonchi (few coarse bs - RLL>LLL.) in the right lower field and the left lower field. She has no rales.  Neurological: She is alert and oriented to person, place, and time.  Skin: Skin is warm and dry. No rash noted.  Psychiatric: She has a normal mood and affect. Her behavior is normal.    Results for orders placed in visit on 06/26/13  POCT CBC      Result Value Range   WBC 12.0 (*) 4.6 - 10.2 K/uL   Lymph, poc 2.2  0.6 - 3.4   POC LYMPH PERCENT 18.3  10 - 50 %L   MID (cbc) 1.1 (*) 0 - 0.9   POC MID % 8.8  0 - 12 %M   POC Granulocyte 8.7 (*) 2 - 6.9   Granulocyte percent 72.9  37 -  80 %G   RBC 3.97 (*) 4.04 - 5.48 M/uL   Hemoglobin 12.5  12.2 - 16.2 g/dL   HCT, POC 16.1  09.6 - 47.9 %   MCV 101.1 (*) 80 - 97 fL   MCH, POC 31.5 (*) 27 - 31.2 pg   MCHC 31.2 (*) 31.8 - 35.4 g/dL   RDW, POC 04.5     Platelet Count, POC 343  142 - 424 K/uL   MPV 9.2  0 - 99.8 fL         Assessment & Plan:  Brianna Duncan is a 67 y.o. female Overall tolerating antibiotic, improved leukocytosis. Stable o2 sat. Continue current regimen.  Plan on recheck in 5 days as long as improving. Sooner if worse or if tolerance/side effects with levaquin  rtc precautions.   Anxiety - situational - see last ov.  rx for xanax given as discussed.  Meds ordered this encounter  Medications  . ALPRAZolam (XANAX) 0.5 MG tablet    Sig: Take 1 tablet (0.5 mg total) by mouth at bedtime as needed for anxiety.    Dispense:  30 tablet    Refill:  1   Patient Instructions  Recheck Monday night as long as long as you are improving. Return to the clinic or go to the nearest emergency room if any of your symptoms worsen or new symptoms  occur. If any new or worsening side effects with antibiotic - let me know.

## 2013-06-26 NOTE — Patient Instructions (Addendum)
Recheck Monday night as long as long as you are improving. Return to the clinic or go to the nearest emergency room if any of your symptoms worsen or new symptoms occur. If any new or worsening side effects with antibiotic - let me know.

## 2013-07-01 ENCOUNTER — Ambulatory Visit (INDEPENDENT_AMBULATORY_CARE_PROVIDER_SITE_OTHER): Payer: Medicare Other | Admitting: Family Medicine

## 2013-07-01 VITALS — BP 128/76 | HR 86 | Temp 98.1°F | Resp 18 | Ht 60.0 in | Wt 148.0 lb

## 2013-07-01 DIAGNOSIS — J189 Pneumonia, unspecified organism: Secondary | ICD-10-CM

## 2013-07-01 NOTE — Progress Notes (Signed)
  Subjective:    Patient ID: Brianna Duncan, female    DOB: 09/16/1946, 67 y.o.   MRN: [id number]  HPI Brianna Duncan is a 67 y.o. female  Dx with RLL Pneumonia - 06/24/13, with elevated WBC then.  Started on Levaquin. Improved leukocytosis on 06/26/13 follow up.   Feeling better since last ov.   Slight cough - lessening, and dry cough. Less "funny feelings" with Levaquin - has about 4 left. No fever. Not short of breath at rest - notes minimal sx's with exertion only. O2 sats in 94-95 range.   Review of Systems  Constitutional: Negative for appetite change (drinking fluids ok. ).  Respiratory: Positive for cough and shortness of breath (as above - exertion only. ). Negative for wheezing.   Cardiovascular: Negative for chest pain.   As above.     Objective:   Physical Exam  Vitals reviewed. Constitutional: She is oriented to person, place, and time. She appears well-developed and well-nourished. No distress.  HENT:  Head: Normocephalic and atraumatic.  Right Ear: Hearing, tympanic membrane, external ear and ear canal normal.  Left Ear: Hearing, tympanic membrane, external ear and ear canal normal.  Nose: Nose normal.  Mouth/Throat: Oropharynx is clear and moist. No oropharyngeal exudate.  Eyes: Conjunctivae and EOM are normal. Pupils are equal, round, and reactive to light.  Cardiovascular: Normal rate, regular rhythm, normal heart sounds and intact distal pulses.   No murmur heard. Pulmonary/Chest: Effort normal. No respiratory distress. She has no decreased breath sounds. She has no wheezes. She has rhonchi. She has no rales.  Few coarse breath sounds RLL>LLL. Normal air mvmt, no distress.   Neurological: She is alert and oriented to person, place, and time.  Skin: Skin is warm and dry. No rash noted.  Psychiatric: She has a normal mood and affect. Her behavior is normal.       Assessment & Plan:  Brianna Duncan is a 67 y.o. female RLL pneumonia  Improving  symptomatically.  May have small amount of atelectasis with few coarse breath sounds, but these are stable, and afebrile. o2 sat stable. Continue Levaquin for 10 day course for recheck in 5 days to make sure is continuing to improve off abx, with likely recheck CBC at that time.  rtc precautions.   Patient Instructions  Recheck this weekend. Return to the clinic or go to the nearest emergency room if any of your symptoms worsen or new symptoms occur. Finish antibiotic as instructed.

## 2013-07-01 NOTE — Patient Instructions (Signed)
Recheck this weekend. Return to the clinic or go to the nearest emergency room if any of your symptoms worsen or new symptoms occur. Finish antibiotic as instructed.

## 2013-07-06 ENCOUNTER — Ambulatory Visit (INDEPENDENT_AMBULATORY_CARE_PROVIDER_SITE_OTHER): Payer: Medicare Other | Admitting: Family Medicine

## 2013-07-06 ENCOUNTER — Ambulatory Visit: Payer: Medicare Other

## 2013-07-06 VITALS — BP 130/74 | HR 81 | Temp 98.0°F | Resp 16 | Ht <= 58 in | Wt 149.0 lb

## 2013-07-06 DIAGNOSIS — R5381 Other malaise: Secondary | ICD-10-CM

## 2013-07-06 DIAGNOSIS — R5383 Other fatigue: Secondary | ICD-10-CM

## 2013-07-06 DIAGNOSIS — J189 Pneumonia, unspecified organism: Secondary | ICD-10-CM

## 2013-07-06 DIAGNOSIS — R05 Cough: Secondary | ICD-10-CM

## 2013-07-06 DIAGNOSIS — R059 Cough, unspecified: Secondary | ICD-10-CM

## 2013-07-06 LAB — POCT CBC
Granulocyte percent: 63 %G (ref 37–80)
HCT, POC: 44.9 % (ref 37.7–47.9)
Hemoglobin: 14 g/dL (ref 12.2–16.2)
Lymph, poc: 2.5 (ref 0.6–3.4)
MCHC: 31.2 g/dL — AB (ref 31.8–35.4)
POC Granulocyte: 5.7 (ref 2–6.9)

## 2013-07-06 NOTE — Patient Instructions (Addendum)
Your xray and blood count appear improved. Should have slow, but steady improvement in energy level, but if any worsening or fever  - recheck with me. Plan on repeat office visit in 5 weeks for follow up chest x ray.  Return to the clinic or go to the nearest emergency room if any of your symptoms worsen or new symptoms occur.

## 2013-07-06 NOTE — Progress Notes (Signed)
Subjective:    Patient ID: Brianna Duncan, female    DOB: Dec 09, 1945, 67 y.o.   MRN: [id number]  HPI  Brianna Duncan is a 67 y.o. female Diagnosed with RLL pneumonia - initial WBC 19.6 on CBC.  Started on Levaquin.  See follow up office visits.   Feels better. Still feels weak, but stronger than last ov.  notices sweaty feeling or short of breath with exertion.  Last dose of Levaquin yesterday. No wheeze. Less cough. No fever, but hasn't checked temp. No chest pains. Less nevous sxs  Review of Systems  Constitutional: Positive for fatigue. Negative for fever, chills and appetite change (eating and drinking normally. ).  Respiratory: Positive for cough and shortness of breath. Negative for wheezing and stridor.   Cardiovascular: Negative for chest pain and leg swelling.       Objective:   Physical Exam  Vitals reviewed. Constitutional: She is oriented to person, place, and time. She appears well-developed and well-nourished. No distress.  HENT:  Head: Normocephalic and atraumatic.  Right Ear: Hearing, tympanic membrane, external ear and ear canal normal.  Left Ear: Hearing, tympanic membrane, external ear and ear canal normal.  Nose: Nose normal.  Mouth/Throat: Oropharynx is clear and moist. No oropharyngeal exudate.  Eyes: Conjunctivae and EOM are normal. Pupils are equal, round, and reactive to light.  Cardiovascular: Normal rate, regular rhythm, normal heart sounds and intact distal pulses.   No murmur heard. Pulmonary/Chest: Effort normal. No respiratory distress. She has no decreased breath sounds. She has no wheezes. She has rhonchi (min RLL coarse bs. normal effort, no distress. ) in the right lower field. She has no rales.  Neurological: She is alert and oriented to person, place, and time.  Skin: Skin is warm and dry. No rash noted.  Psychiatric: She has a normal mood and affect. Her behavior is normal.   Filed Vitals:   07/06/13 1109  BP: 130/74  Pulse: 81   Temp: 98 F (36.7 C)  Resp: 16  initial O2 sat - 91% - repeated - 95-97% RA.   Results for orders placed in visit on 07/06/13  POCT CBC      Result Value Range   WBC 9.0  4.6 - 10.2 K/uL   Lymph, poc 2.5  0.6 - 3.4   POC LYMPH PERCENT 28.2  10 - 50 %L   MID (cbc) 0.8  0 - 0.9   POC MID % 8.8  0 - 12 %M   POC Granulocyte 5.7  2 - 6.9   Granulocyte percent 63.0  37 - 80 %G   RBC 4.45  4.04 - 5.48 M/uL   Hemoglobin 14.0  12.2 - 16.2 g/dL   HCT, POC 78.2  95.6 - 47.9 %   MCV 100.9 (*) 80 - 97 fL   MCH, POC 31.5 (*) 27 - 31.2 pg   MCHC 31.2 (*) 31.8 - 35.4 g/dL   RDW, POC 21.3     Platelet Count, POC 350  142 - 424 K/uL   MPV 8.6  0 - 99.8 fL   [facility] reading (PRIMARY) by  Dr. Neva Seat: CXR: interval improvement in RLL markings.     Assessment & Plan:  Brianna Duncan is a 67 y.o. female RLL pneumonia - Plan: POCT CBC, DG Chest 2 View  Cough - Plan: POCT CBC, DG Chest 2 View  Other malaise and fatigue  Improved pneumonia.  S/p Levaquin x 10 days. Ambulatory pulse ox - 95-96%.  CBC  reassuring.  Cont sx care, relative rest,  Recheck for possible XR in 5 weeks - sooner if any worsening.   Patient Instructions  Your xray and blood count appear improved. Should have slow, but steady improvement in energy level, but if any worsening or fever  - recheck with me. Plan on repeat office visit in 5 weeks for follow up chest x ray.  Return to the clinic or go to the nearest emergency room if any of your symptoms worsen or new symptoms occur.

## 2013-08-12 ENCOUNTER — Other Ambulatory Visit: Payer: Self-pay | Admitting: Physician Assistant

## 2013-08-12 ENCOUNTER — Ambulatory Visit (INDEPENDENT_AMBULATORY_CARE_PROVIDER_SITE_OTHER): Payer: Medicare Other | Admitting: Family Medicine

## 2013-08-12 ENCOUNTER — Ambulatory Visit: Payer: Medicare Other

## 2013-08-12 ENCOUNTER — Encounter: Payer: Self-pay | Admitting: Family Medicine

## 2013-08-12 VITALS — BP 120/72 | HR 82 | Temp 98.5°F | Resp 16 | Ht 58.5 in | Wt 147.6 lb

## 2013-08-12 DIAGNOSIS — J189 Pneumonia, unspecified organism: Secondary | ICD-10-CM

## 2013-08-12 DIAGNOSIS — Z23 Encounter for immunization: Secondary | ICD-10-CM

## 2013-08-12 DIAGNOSIS — I10 Essential (primary) hypertension: Secondary | ICD-10-CM

## 2013-08-12 MED ORDER — PNEUMOCOCCAL VAC POLYVALENT 25 MCG/0.5ML IJ INJ: 0.5000 mL | INJECTION | INTRAMUSCULAR | Status: DC

## 2013-08-12 MED ORDER — LISINOPRIL-HYDROCHLOROTHIAZIDE 20-12.5 MG PO TABS: ORAL_TABLET | ORAL | Status: DC

## 2013-08-12 MED ORDER — AMLODIPINE BESYLATE 10 MG PO TABS: ORAL_TABLET | ORAL | Status: DC

## 2013-08-12 MED ORDER — PNEUMOCOCCAL 13-VAL CONJ VACC IM SUSP: 0.5000 mL | INTRAMUSCULAR | Status: DC

## 2013-08-12 NOTE — Patient Instructions (Addendum)
Plan on repeat chest xray in 3 months. If your cough does not continue to improve, or worsens sooner - return for recheck sooner.  Return to the clinic or go to the nearest emergency room if any of your symptoms worsen or new symptoms occur. Plan on medicare physical in the next 6 months. You can call to schedule this.  You should receive a call or letter about your lab results within the next week to 10 days.

## 2013-08-12 NOTE — Progress Notes (Signed)
Subjective:    Patient ID: Brianna Duncan, female    DOB: 09-01-1946, 67 y.o.   MRN: [id number]  HPI EMILLEE Duncan is a 67 y.o. female  HTN - takes Novasc 10mg  QD, zestoretic 20/12.5mg  QD. No outside/home BP's. Peptic ulcer 30 years ago, but no recent PUD.  No new chest pains, dyspnea.  No lightheadedness or orthostatic sx's. Creatinine 0.83 in 06/2012.   Dx with RLL Pneumonia - 06/24/13, Started on Levaquin, 10 day course.  Improved leukocytosis on 06/26/13 follow up. Improved/stable off abx at follow up. Energy level much better.  Minimal cough at  Night only, not during day, and is slowly improving. No fevers.  No chest pains, no unexplained wt loss. Minimal shortness of breath with exertion - has gained some weight plast 6 months, less active.  Thinks due to this. No LE edema. BNP WNL at 06/24/13 ov.  07/06/13 CXR report:  Clinical Data: Follow-up right lower lobe pneumonia.  CHEST - 2 VIEW  Comparison: 06/24/2013  Findings: The cardiomediastinal silhouette is unremarkable.  Right lower lung opacity has resolved.  Mild left basilar atelectasis/scarring is again identified.  There is no evidence of focal airspace disease, pulmonary edema,  suspicious pulmonary nodule/mass, pleural effusion, or  pneumothorax.  No acute bony abnormalities are identified.  IMPRESSION:  Resolved right lower lung pneumonia.  No acute abnormalities identified.  Clinically significant discrepancy from primary report, if  provided: None  Has not had pneumovax in the past, or flu vaccine this year. No recent CPE.   Had Zostavax 06/2012.        Review of Systems  Constitutional: Negative for fatigue and unexpected weight change.  Respiratory: Positive for cough. Negative for chest tightness and wheezing.   Cardiovascular: Negative for chest pain, palpitations and leg swelling.  Gastrointestinal: Negative for abdominal pain and blood in stool.  Neurological: Negative for dizziness, syncope,  light-headedness and headaches.       Objective:   Physical Exam  Vitals reviewed. Constitutional: She is oriented to person, place, and time. She appears well-developed and well-nourished.  HENT:  Head: Normocephalic and atraumatic.  Eyes: Conjunctivae and EOM are normal. Pupils are equal, round, and reactive to light.  Neck: Carotid bruit is not present.  Cardiovascular: Normal rate, regular rhythm, normal heart sounds and intact distal pulses.   Pulmonary/Chest: Effort normal.  Overall clear with normal effort, few faint rales LLL only. No rhonchi/wheeze.   Abdominal: Soft. She exhibits no pulsatile midline mass. There is no tenderness.  Neurological: She is alert and oriented to person, place, and time.  Skin: Skin is warm and dry.  Psychiatric: She has a normal mood and affect. Her behavior is normal.   [facility] reading (PRIMARY) by  Dr. Neva Seat: CXR: few increased markings -LLL, unchanged.  No discrete infiltrate.       Assessment & Plan:  Brianna Duncan is a 67 y.o. female Pneumonia - Plan: DG Chest 2 View, CANCELED: DG Chest 1 View - RLL PNA appears resolved, few rales LLL - persistent atelectasis vs bronchiectasis, likely post infectious cough. Plan on repeat CXR in 3 months.   HTN - stable - refilled zestoretic and norvasc at current doses.   Need for prophylactic vaccination and inoculation against influenza - Plan: Flu Vaccine QUAD 36+ mos IM given.  Need for prophylactic vaccination against Streptococcus pneumoniae (pneumococcus) - Plan: Pneumococcal polysaccharide vaccine 23-valent greater than or equal to 2yo subcutaneous/IM - given.   Health Maintenance -  Plan on [facility]  physical in next 6 months.  Pneumovax and Flu vaccines given today.  UTD on Zostavax.   Meds ordered this encounter  . lisinopril-hydrochlorothiazide (PRINZIDE,ZESTORETIC) 20-12.5 MG per tablet    Sig: TAKE ONE TABLET BY MOUTH ONCE DAILY    Dispense:  90 tablet    Refill:  1  . amLODipine  (NORVASC) 10 MG tablet    Sig: TAKE ONE TABLET BY MOUTH ONCE DAILY    Dispense:  90 tablet    Refill:  1   Patient Instructions  Plan on repeat chest xray in 3 months. If your cough does not continue to improve, or worsens sooner - return for recheck sooner.  Return to the clinic or go to the nearest emergency room if any of your symptoms worsen or new symptoms occur. Plan on medicare physical in the next 6 months. You can call to schedule this.  You should receive a call or letter about your lab results within the next week to 10 days.

## 2013-08-13 LAB — BASIC METABOLIC PANEL
BUN: 18 mg/dL (ref 6–23)
Chloride: 105 mEq/L (ref 96–112)
Glucose, Bld: 131 mg/dL — ABNORMAL HIGH (ref 70–99)
Potassium: 4.2 mEq/L (ref 3.5–5.3)

## 2013-08-19 ENCOUNTER — Encounter: Payer: Self-pay | Admitting: Family Medicine

## 2013-09-30 ENCOUNTER — Other Ambulatory Visit: Payer: Self-pay | Admitting: Family Medicine

## 2013-09-30 DIAGNOSIS — F411 Generalized anxiety disorder: Secondary | ICD-10-CM

## 2013-11-11 ENCOUNTER — Other Ambulatory Visit: Payer: Self-pay | Admitting: Family Medicine

## 2013-11-15 ENCOUNTER — Other Ambulatory Visit: Payer: Self-pay | Admitting: Family Medicine

## 2013-12-16 ENCOUNTER — Other Ambulatory Visit: Payer: Self-pay | Admitting: Family Medicine

## 2013-12-16 DIAGNOSIS — F418 Other specified anxiety disorders: Secondary | ICD-10-CM

## 2013-12-17 NOTE — Telephone Encounter (Signed)
Will refill today, planned to recheck CXR at 3 months at last ov in September, so recommend repeat ov in next month if possible.

## 2013-12-18 NOTE — Telephone Encounter (Signed)
LMOM w/Dr Greene's message.

## 2013-12-18 NOTE — Telephone Encounter (Signed)
Called in Rx

## 2014-01-27 ENCOUNTER — Ambulatory Visit (INDEPENDENT_AMBULATORY_CARE_PROVIDER_SITE_OTHER): Payer: Medicare Other | Admitting: Family Medicine

## 2014-01-27 ENCOUNTER — Ambulatory Visit: Payer: Medicare Other

## 2014-01-27 ENCOUNTER — Encounter: Payer: Self-pay | Admitting: Family Medicine

## 2014-01-27 VITALS — BP 111/93 | HR 113 | Temp 98.6°F | Resp 18 | Ht 58.75 in | Wt 155.4 lb

## 2014-01-27 DIAGNOSIS — F339 Major depressive disorder, recurrent, unspecified: Secondary | ICD-10-CM

## 2014-01-27 DIAGNOSIS — R05 Cough: Secondary | ICD-10-CM

## 2014-01-27 DIAGNOSIS — I10 Essential (primary) hypertension: Secondary | ICD-10-CM

## 2014-01-27 DIAGNOSIS — J189 Pneumonia, unspecified organism: Secondary | ICD-10-CM

## 2014-01-27 DIAGNOSIS — R059 Cough, unspecified: Secondary | ICD-10-CM

## 2014-01-27 DIAGNOSIS — J309 Allergic rhinitis, unspecified: Secondary | ICD-10-CM

## 2014-01-27 DIAGNOSIS — F418 Other specified anxiety disorders: Secondary | ICD-10-CM

## 2014-01-27 MED ORDER — LISINOPRIL-HYDROCHLOROTHIAZIDE 20-12.5 MG PO TABS: ORAL_TABLET | ORAL | Status: DC

## 2014-01-27 MED ORDER — AMLODIPINE BESYLATE 10 MG PO TABS: ORAL_TABLET | ORAL | Status: DC

## 2014-01-27 MED ORDER — ALPRAZOLAM 0.5 MG PO TABS: 0.2500 mg | ORAL_TABLET | Freq: Two times a day (BID) | ORAL | Status: DC | PRN

## 2014-01-27 MED ORDER — SERTRALINE HCL 50 MG PO TABS: 50.0000 mg | ORAL_TABLET | Freq: Every day | ORAL | Status: DC

## 2014-01-27 NOTE — Progress Notes (Addendum)
Subjective:    Patient ID: Brianna Duncan, female    DOB: [birth year], 68 y.o.   MRN: [id number]  Authored by [name], MD.   HPI Brianna Duncan is a 68 y.o. female Pt is here for a fu appointment.  Initially diagnosed with right lower pneumonia on 06/24/13.  Treated with Levaquin.  Persistent atelectasis vs bronchiectasis with suspected post infectious cough.  Last seen in September.  Chest x-ray report at that visit didn't not show acute disease but findings above.    Pt states at night when she is laying down she has a non productive persistent cough.  She thinks it is drainage.  She has tried Mucinex allergy OTC medication with some relief.  Pt states she has the cough during the day, but it is worse at night.  Pt denies having the cough before her diagnosis of pneumonia.  Pt denies fever and night sweats different than her normal menopausal sweats.  Pt also complains of sneezing and rhinorrhea.  Pt states she hasn't been using a nasal spray.    Pt also states she needs her medications refilled.  Pt denies checking her BP outside of visits.     Pt had a complete hysterectomy for benign reason.   Takes Xanax 0.5 mg nightly.  Pt states she has been depressed lately.  Pt states the depression started several years ago.  Pt states she was taking Celexa, but she stopped taking it in November because it wasn't working.  Pt denies talking to a counselor or therapist and denies any interest in talking to one.  She states if she did visit a therapist she would prefer a female.  Pt states her daughter takes Zoloft and has been successful.  Pt states she drinks about 2 glasses of wine a day.  She use to drink on the weekends, but states she now drinks during the week.  Pt denies suicidal thoughts.  Pt states her daughter has had three surgeries in one year and her boss passed away.     Patient Active Problem List   Diagnosis Date Noted  . Unspecified essential hypertension 02/25/2013    Past Surgical History  Procedure Laterality Date  . Abdominal hysterectomy    . Foot surgery      right foot, left great toe and left second toe surgery   Allergies  Allergen Reactions  . Ivp Dye [Iodinated Diagnostic Agents] Hives   History   Social History  . Marital Status: Divorced    Spouse Name: N/A    Number of Children: N/A  . Years of Education: N/A   Occupational History  . Not on file.   Social History Main Topics  . Smoking status: Former Research scientist (life sciences)  . Smokeless tobacco: Never Used     Comment: quit 13 yrs ago  . Alcohol Use: Yes     Comment: occassionally  . Drug Use: No  . Sexual Activity: Yes    Birth Control/ Protection: None   Other Topics Concern  . Not on file   Social History Narrative  . No narrative on file     Review of Systems  Constitutional: Negative for fever and chills.  HENT: Positive for sneezing. Negative for congestion, ear pain and sore throat.   Respiratory: Positive for cough. Negative for chest tightness and shortness of breath.   Cardiovascular: Negative for chest pain.  Gastrointestinal: Negative for nausea, vomiting, abdominal pain and diarrhea.  Musculoskeletal: Negative for back pain.  Skin: Negative  for color change and rash.  Neurological: Negative for dizziness, syncope and headaches.  Psychiatric/Behavioral: Negative for behavioral problems and confusion.       Objective:   Physical Exam  Nursing note and vitals reviewed. Constitutional: She is oriented to person, place, and time. She appears well-developed and well-nourished. No distress.  HENT:  Head: Normocephalic and atraumatic.  Right Ear: Ear canal normal.  Left Ear: Ear canal normal.  Nose: Nose normal. Right sinus exhibits no maxillary sinus tenderness and no frontal sinus tenderness. Left sinus exhibits no maxillary sinus tenderness and no frontal sinus tenderness.  Mouth/Throat: Uvula is midline and oropharynx is clear and moist. No oropharyngeal  exudate, posterior oropharyngeal edema or posterior oropharyngeal erythema.  Slight edema of turbinates.  TM pearly gray bilaterally.  Eyes: Conjunctivae and EOM are normal. Right eye exhibits no discharge. Left eye exhibits no discharge.  Neck: Neck supple. No tracheal deviation present.  Cardiovascular: Normal rate, regular rhythm and normal heart sounds.  Exam reveals no gallop and no friction rub.   No murmur heard. Pulmonary/Chest: Effort normal and breath sounds normal. No respiratory distress. She has no wheezes. She has no rales. She exhibits no tenderness.  Few faint course breath sounds right lower lobe only.  Abdominal: Soft. She exhibits no distension.  Musculoskeletal: Normal range of motion. She exhibits no edema and no tenderness.  Neurological: She is alert and oriented to person, place, and time.  Skin: Skin is warm and dry. No rash noted.  Psychiatric: She has a normal mood and affect. Her behavior is normal.    Filed Vitals:   01/27/14 1626  BP: 111/93  Pulse: 113  Temp: 98.6 F (37 C)  TempSrc: Oral  Resp: 18  Height: 4' 10.75" (1.492 m)  Weight: 155 lb 6.4 oz (70.489 kg)  SpO2: 98%   [facility] reading (PRIMARY) by  Dr. Carlota Raspberry: CXR: ? Atelectasis RLL. Brianna Duncan from prior.      Assessment & Plan:   [name] is a 68 y.o. female Pneumonia - Plan: DG Chest 2 View - stable. Resolved PNA and suspect some atelectasis. PND from [place] triggering cough likely.  If cough not improving with allergy treatment - consider further imaging or trial off ACE-I.   HTN (hypertension) - Plan: lisinopril-hydrochlorothiazide (PRINZIDE,ZESTORETIC) 20-12.5 MG per tablet, amLODipine (NORVASC) 10 MG tablet refilled. Plan on labs, including renal fxn next ov in few weeks.   Depression, major, recurrent - Plan: sertraline (ZOLOFT) 50 MG tablet, with Situational anxiety - Plan: ALPRAZolam (XANAX) 0.5 MG tablet.  Years of depression sx's admitted today.  Prior trial of Celexa, but as no  relief, will try Zoloft as daughter has had success with this. Recheck in 2-3 weeks with labs at that time. Advised to cut back to 1 drink per day and caution with BDZ d/t side effects/added side effects. Phone numbers for counseling.   Allergic rhinitis - trial of otc flonase. If not improved, consider less sedating antihistamine, but discussed anticholinergic SE's of these.   Meds ordered this encounter  Medications  . sertraline (ZOLOFT) 50 MG tablet    Sig: Take 1 tablet (50 mg total) by mouth daily.    Dispense:  30 tablet    Refill:  3  . lisinopril-hydrochlorothiazide (PRINZIDE,ZESTORETIC) 20-12.5 MG per tablet    Sig: TAKE ONE TABLET BY MOUTH ONCE DAILY    Dispense:  90 tablet    Refill:  1  . amLODipine (NORVASC) 10 MG tablet    Sig: TAKE  ONE TABLET BY MOUTH ONCE DAILY    Dispense:  90 tablet    Refill:  1  . ALPRAZolam (XANAX) 0.5 MG tablet    Sig: Take 0.5-1 tablets (0.25-0.5 mg total) by mouth 2 (two) times daily as needed for anxiety.    Dispense:  30 tablet    Refill:  0   Patient Instructions  Recheck with Dr. Carlota Raspberry in 3 weeks, and will need to check bloodwork at that time.  Start flonase nasal spray, and if cough not improving - will look at other causes.  Counseling:  Nunzio Cobbs or Arvil Chaco   [phone]  Return to the clinic or go to the nearest emergency room if any of your symptoms worsen or new symptoms occur.   I personally performed the services described in this documentation, which was scribed in my presence. The recorded information has been reviewed and considered, and addended by me as needed.

## 2014-01-27 NOTE — Patient Instructions (Signed)
Recheck with Dr. Carlota Raspberry in 3 weeks, and will need to check bloodwork at that time.  Start flonase nasal spray, and if cough not improving - will look at other causes.  Counseling:  Nunzio Cobbs or Arvil Chaco   [phone]  Return to the clinic or go to the nearest emergency room if any of your symptoms worsen or new symptoms occur.

## 2014-01-28 NOTE — Progress Notes (Signed)
Left message for patient regarding 3/16 @ 4:15 for a follow up with Dr Carlota Raspberry.

## 2014-02-03 ENCOUNTER — Telehealth: Payer: Self-pay | Admitting: Family Medicine

## 2014-02-03 NOTE — Telephone Encounter (Signed)
Called and left vm for patient to give [place] a call bk to schedule a 3wk fu w/doctor Carlota Raspberry

## 2014-03-17 ENCOUNTER — Ambulatory Visit: Payer: Medicare Other | Admitting: Family Medicine

## 2014-03-25 ENCOUNTER — Other Ambulatory Visit: Payer: Self-pay | Admitting: Family Medicine

## 2014-03-25 DIAGNOSIS — G4700 Insomnia, unspecified: Secondary | ICD-10-CM

## 2014-03-27 ENCOUNTER — Telehealth: Payer: Self-pay

## 2014-03-27 NOTE — Telephone Encounter (Signed)
Pt is calling to check statue of refill request for alprazolam, states [facility] on [address] has sent in the refill request

## 2014-03-27 NOTE — Telephone Encounter (Signed)
Refilled. Keep appt in June with me.

## 2014-04-02 ENCOUNTER — Telehealth: Payer: Self-pay

## 2014-04-02 ENCOUNTER — Ambulatory Visit (INDEPENDENT_AMBULATORY_CARE_PROVIDER_SITE_OTHER): Payer: Medicare Other | Admitting: Family Medicine

## 2014-04-02 VITALS — BP 130/70 | HR 118 | Temp 98.7°F | Resp 16 | Ht <= 58 in | Wt 154.0 lb

## 2014-04-02 DIAGNOSIS — R000 Tachycardia, unspecified: Secondary | ICD-10-CM

## 2014-04-02 DIAGNOSIS — I10 Essential (primary) hypertension: Secondary | ICD-10-CM

## 2014-04-02 DIAGNOSIS — R05 Cough: Secondary | ICD-10-CM

## 2014-04-02 DIAGNOSIS — R42 Dizziness and giddiness: Secondary | ICD-10-CM

## 2014-04-02 DIAGNOSIS — R002 Palpitations: Secondary | ICD-10-CM

## 2014-04-02 DIAGNOSIS — R0989 Other specified symptoms and signs involving the circulatory and respiratory systems: Secondary | ICD-10-CM

## 2014-04-02 DIAGNOSIS — F32A Depression, unspecified: Secondary | ICD-10-CM

## 2014-04-02 DIAGNOSIS — R7309 Other abnormal glucose: Secondary | ICD-10-CM

## 2014-04-02 DIAGNOSIS — R739 Hyperglycemia, unspecified: Secondary | ICD-10-CM

## 2014-04-02 DIAGNOSIS — F3289 Other specified depressive episodes: Secondary | ICD-10-CM

## 2014-04-02 DIAGNOSIS — Z9181 History of falling: Secondary | ICD-10-CM

## 2014-04-02 DIAGNOSIS — R059 Cough, unspecified: Secondary | ICD-10-CM

## 2014-04-02 DIAGNOSIS — F329 Major depressive disorder, single episode, unspecified: Secondary | ICD-10-CM

## 2014-04-02 LAB — POCT CBC
GRANULOCYTE PERCENT: 64.3 % (ref 37–80)
HCT, POC: 41.4 % (ref 37.7–47.9)
Hemoglobin: 13.5 g/dL (ref 12.2–16.2)
Lymph, poc: 2.1 (ref 0.6–3.4)
MCH, POC: 31.7 pg — AB (ref 27–31.2)
MCHC: 32.6 g/dL (ref 31.8–35.4)
MCV: 97.1 fL — AB (ref 80–97)
MID (CBC): 1.1 — AB (ref 0–0.9)
MPV: 10.1 fL (ref 0–99.8)
PLATELET COUNT, POC: 258 10*3/uL (ref 142–424)
POC GRANULOCYTE: 5.7 (ref 2–6.9)
POC LYMPH %: 23.2 % (ref 10–50)
POC MID %: 12.5 %M — AB (ref 0–12)
RBC: 4.26 M/uL (ref 4.04–5.48)
RDW, POC: 14.2 %
WBC: 8.9 10*3/uL (ref 4.6–10.2)

## 2014-04-02 LAB — TSH: TSH: 2.184 u[IU]/mL (ref 0.350–4.500)

## 2014-04-02 LAB — GLUCOSE, POCT (MANUAL RESULT ENTRY): POC Glucose: 111 mg/dl — AB (ref 70–99)

## 2014-04-02 LAB — POCT GLYCOSYLATED HEMOGLOBIN (HGB A1C): Hemoglobin A1C: 5.2

## 2014-04-02 NOTE — Patient Instructions (Addendum)
Cut back on alcohol - no more than 1 glass of wine per day, and do not combine with xanax.  Again, I recommend meeting with a counselor/therapist as names given last time and family counselor is also helpful.  I will refer you to neurologist and schedule a ct scan of your head.   If any increased dizziness, or falls - go to emergency room.  We will schedule a carotid doppler, and refer you back to Dr. Einar Gip.  If any worsening of symptoms - return here or emergency room.  Recheck with me in next few weeks.   You should receive a call or letter about your lab results within the next week to 10 days.

## 2014-04-02 NOTE — Telephone Encounter (Signed)
She has nausea vomiting dizziness Unable to hold the walk without holding the wall Memory loss ?'s dementia Slurred speech Bad fam h/o MI and CVA Pt's daughter is going to bring her mom back in tonight

## 2014-04-02 NOTE — Telephone Encounter (Signed)
ANN STATES SHE WOULD LIKE TO SPEAK WITH DR Carlota Raspberry OR HIS ASST WHO IS GOING TO BE ATTENTIVE TO HER MOM AND LISTEN TO WHAT SHE HAVE TO SAY NEED A PHONE CALL BEFORE THE NIGHT IS OVER. [employer] [phone]

## 2014-04-02 NOTE — Progress Notes (Addendum)
Subjective:    Patient ID: Brianna Duncan, female    DOB: [birth year], 68 y.o.   MRN: [id number] This chart was scribed for Meredith Staggers, MD by Evon Slack, ED Scribe. This Patient was seen in room 14 and the patients care was started at 4:22 PM  HPI  Brianna Duncan is a 68 y.o. female is returning for a follow up for cough.  History RLL pneumonia jul 2014 persistent marking on chest x ray with atelectasis  States that cough is completely gone and relieved of all similar symptoms after taking Flonase nasal  spray  HTN Kidney function normal, creatinine 0.05 August 2013 States her blood pressure was good today. She states for years that she has been feeling palpations from her heart. Has seen cardiologist in past and had normal cath years ago. No recent changes in palpitations   Hyperglycemia  Glucose 131 September 2014.  Rechecking today.   Depression takes Xanax most nights, noted to have years of depression symptoms that she recently admitted to last visit. Started on Zoloft 50 mg January 27 2014 plan to recheck in 2 weeks also was given phone numbers for counseling. She states that's she went to [place] and stopped taking her Zoloft on 03/06/14. She states that when she stopped taking she started feeling light headed and dizziness. She States that she has not talked with any of the counselors recommended. Has not taken Zoloft recently.  She states that she feels fine now and is not feeling depressed. Does not feel like she needs to be on Zoloft at this time.dizziness resolved past few weeks.   Pt denies weakness in arm or legs, slurred speech, or any new weakness   Past Medical History  Diagnosis Date  . Hypertension   . Allergy   . Anxiety   . Osteoporosis    Past Surgical History  Procedure Laterality Date  . Abdominal hysterectomy    . Foot surgery      right foot, left great toe and left second toe surgery   Prior to Admission medications   Medication Sig  Start Date End Date Taking? Authorizing Provider  ALPRAZolam Prudy Feeler) 0.5 MG tablet TAKE ONE TABLET BY MOUTH AT BEDTIME AS NEEDED FOR ANXIETY   Yes Shade Flood, MD  amLODipine (NORVASC) 10 MG tablet TAKE ONE TABLET BY MOUTH ONCE DAILY 01/27/14  Yes Shade Flood, MD  b complex vitamins tablet Take 1 tablet by mouth daily.   Yes Historical Provider, MD  calcium citrate-vitamin D 200-200 MG-UNIT TABS Take 1 tablet by mouth daily.   Yes Historical Provider, MD  estradiol (ESTRACE VAGINAL) 0.1 MG/GM vaginal cream Place 2 g vaginally once a week.   Yes Historical Provider, MD  estradiol (VIVELLE-DOT) 0.075 MG/24HR Place 1 patch onto the skin 2 (two) times a week.   Yes Historical Provider, MD  glucosamine-chondroitin 500-400 MG tablet Take 2 tablets by mouth once.   Yes Historical Provider, MD  lisinopril-hydrochlorothiazide (PRINZIDE,ZESTORETIC) 20-12.5 MG per tablet TAKE ONE TABLET BY MOUTH ONCE DAILY 01/27/14  Yes Shade Flood, MD  sertraline (ZOLOFT) 50 MG tablet Take 1 tablet (50 mg total) by mouth daily. 01/27/14  Yes Shade Flood, MD  Testosterone Propionate 2 % CREA Place onto the skin as needed.   Yes Historical Provider, MD  therapeutic multivitamin-minerals (THERAGRAN-M) tablet Take 1 tablet by mouth daily.   Yes Historical Provider, MD  cholecalciferol (VITAMIN D) 1000 UNITS tablet Take 1,000 Units by mouth daily.  Historical Provider, MD  fluticasone (FLONASE) 50 MCG/ACT nasal spray Place 2 sprays into the nose daily. 01/18/12 01/17/13  Hayden Rasmussen, MD   Allergies  Allergen Reactions  . Ivp Dye [Iodinated Diagnostic Agents] Hives   History   Social History  . Marital Status: Divorced    Spouse Name: N/A    Number of Children: N/A  . Years of Education: N/A   Occupational History  . Not on file.   Social History Main Topics  . Smoking status: Former Research scientist (life sciences)  . Smokeless tobacco: Never Used     Comment: quit 13 yrs ago  . Alcohol Use: Yes     Comment:  occassionally  . Drug Use: No  . Sexual Activity: Yes    Birth Control/ Protection: None   Other Topics Concern  . Not on file   Social History Narrative  . No narrative on file   Review of Systems  Respiratory: Negative for cough and shortness of breath.   Cardiovascular: Positive for palpitations. Negative for chest pain and leg swelling.  Neurological: Positive for dizziness and light-headedness.  Psychiatric/Behavioral: Negative for dysphoric mood.       Objective:   Filed Vitals:   04/02/14 1601  BP: 130/70  Pulse: 118  Temp: 98.7 F (37.1 C)  TempSrc: Oral  Resp: 16  Height: 4\' 10"  (1.473 m)  Weight: 154 lb (69.854 kg)  SpO2: 93%     Physical Exam  Nursing note and vitals reviewed. Constitutional: She is oriented to person, place, and time. She appears well-developed and well-nourished. No distress.  HENT:  Head: Normocephalic and atraumatic.  Eyes: EOM are normal.  Neck: Neck supple. Carotid bruit is present (faint bruit on right. ). No tracheal deviation present.  Cardiovascular:  Murmur heard. Tachycardiac with 6-9/6 systolic murmur - heard at LUSB.   Pulmonary/Chest: Effort normal and breath sounds normal. No respiratory distress. She has no wheezes.  Musculoskeletal: Normal range of motion.  Trace pidal edema non pitting  Neurological: She is alert and oriented to person, place, and time. She has normal strength. No cranial nerve deficit. Coordination normal.  No focal weakness, negative pronator drift, negative romberg. Grossly equal strength.   Skin: Skin is warm and dry.  Psychiatric: She has a normal mood and affect. Her behavior is normal.   EKG: sinus tachycardia, rate 111.  Nonspecific ST waves anterolaterally, but no other significant change from 02/2008.   Paper chart review:  OV from Dr. Einar Gip 07/2008 - hypertensive heart dz.  Tortuosity of coronary arteries, no renal artery stenosis. Normal abdominal aorta.      Results for orders placed  in visit on 04/02/14  GLUCOSE, POCT (MANUAL RESULT ENTRY)      Result Value Ref Range   POC Glucose 111 (*) 70 - 99 mg/dl  POCT GLYCOSYLATED HEMOGLOBIN (HGB A1C)      Result Value Ref Range   Hemoglobin A1C 5.2    POCT CBC      Result Value Ref Range   WBC 8.9  4.6 - 10.2 K/uL   Lymph, poc 2.1  0.6 - 3.4   POC LYMPH PERCENT 23.2  10 - 50 %L   MID (cbc) 1.1 (*) 0 - 0.9   POC MID % 12.5 (*) 0 - 12 %M   POC Granulocyte 5.7  2 - 6.9   Granulocyte percent 64.3  37 - 80 %G   RBC 4.26  4.04 - 5.48 M/uL   Hemoglobin 13.5  12.2 -  16.2 g/dL   HCT, POC 41.4  37.7 - 47.9 %   MCV 97.1 (*) 80 - 97 fL   MCH, POC 31.7 (*) 27 - 31.2 pg   MCHC 32.6  31.8 - 35.4 g/dL   RDW, POC 14.2     Platelet Count, POC 258  142 - 424 K/uL   MPV 10.1  0 - 99.8 fL    Assessment & Plan:   Initial plan of care revised as below after phone call received from daughter after patient's visit:  8:22 PM: Phone call received after patient's visit- see phone note. Patient seen back in room 14, now woth other family/daughter to discuss other concerns.   Pt states that she has been dizzy but not everyday. Daughter states her complexion has been different, has slurred speech at times, appears unsteady at times, and has had a history of falls - 6-8 falls in past 6 months. Has had some progressive loss of strength with writing, sometimes unstable gait. States that her last fall was off of a ladder while fixing a window a week ago. Hit head then. NO LOC/vision change/acute weakness/N/V.  She denies any alcohol consumption at that time. Daughter states with the loss of her sister she repressed some of her feelings. Daughter also states that she has a family history of cardiac problems and worried about Brianna Duncan.  Pt states she takes 81 mg of Asprin per day. Seen by optometrist this morning, no new changes, vision stable. Chara does admit to some unsteadiness at times, and dizziness has persisted more than just after stopping SSRI.  Interviewed without family in room, and does admit to multiple family stressors and at times some increased alcohol intake - few drinks per day. Denies problem drinking or difficulty in cutting back if needed. Does not feel depressed, just anxious. No anhedonia. Still would not like to meet with therapist but will think about this.   8:59 PM Finished 2nd discussion with patient and family.  Over 30 minutes face to face care on 2nd evaluation.  Brianna Duncan is a 68 y.o. female Cough - Plan: POCT CBC, Basic metabolic panel  - resolved.  flonase ns may have helped with   Hyperglycemia - Plan: POCT glucose (manual entry), POCT glycosylated hemoglobin (Hb A1C),   -nonfasting. A1c ok today.   HTN (hypertension) - Plan: Basic metabolic panel, TSH, Ambulatory referral to Cardiology  -stable. No change in regimen.   Tachycardia - Plan: EKG 12-Lead, EKG 12-Lead, episodic Palpitations  -prior cath approx 6 years ago - and persistent tachycardia - refer back to Dr. Einar Gip. ER/RTC precautions.    Dizziness   -POCT CBC, Basic metabolic panel, TSH, CT Head Wo Contrast, Ambulatory referral to Cardiology, Ambulatory referral to Neurology, US Carotid Duplex Bilateral.  nonfocal neuro exam, but persistent dizziness by subsequent hx. Does not appear to be simply from serotonin withdrawal.  Check carotid doppler, CT head, refer to neuro, and eval with cardiology as above.   Depression/situtaional anxiety   - denies depressed mood/anhedonia at present, but appears to be anxiety component as well as some familial stressors. Possible repressed concerns form sister's passing, but encouraged family counseling with dynamics noted in office and to help not only with Ms. Cuppett but to help with communication of her and dtr's concerns. Discussed decreasing alcohol as this is self medicating.  Xanax if needed, with caution of side effects and to not combine with alcohol.   History of falling   - Plan: CT  Head  Wo Contrast, Ambulatory referral to Neurology, US Carotid Duplex Bilateral as above. Er precautions.   Carotid bruit   - Plan: Ambulatory referral to Cardiology, US Carotid Duplex Bilateral pending.   No orders of the defined types were placed in this encounter.   Patient Instructions  Cut back on alcohol - no more than 1 glass of wine per day, and do not combine with xanax.  Again, I recommend meeting with a counselor/therapist as names given last time and family counselor is also helpful.  I will refer you to neurologist and schedule a ct scan of your head.   If any increased dizziness, or falls - go to emergency room.  We will schedule a carotid doppler, and refer you back to Dr. Einar Gip.  If any worsening of symptoms - return here or emergency room.  Recheck with me in next few weeks.   You should receive a call or letter about your lab results within the next week to 10 days.          ADDENDUM 04/03/14: CT head report received:  FINDINGS:  The ventricles, cisterns and other CSF spaces are normal. There is  no mass, mass effect, shift of midline structures or acute  hemorrhage. There is evidence of subtle chronic ischemic  microvascular disease. There is no definite evidence of acute  infarction. Remaining bony structures are within normal.  IMPRESSION:  No acute intracranial findings.  Mild chronic ischemic microvascular disease.  12:33 PM Advised pt's dtr of results.

## 2014-04-03 ENCOUNTER — Ambulatory Visit (HOSPITAL_COMMUNITY)
Admission: RE | Admit: 2014-04-03 | Discharge: 2014-04-03 | Disposition: A | Discharge status: Home / Self Care | Payer: Medicare Other | Source: Ambulatory Visit | Attending: Family Medicine | Admitting: Family Medicine

## 2014-04-03 ENCOUNTER — Encounter (HOSPITAL_COMMUNITY): Payer: Self-pay

## 2014-04-03 DIAGNOSIS — Z9181 History of falling: Secondary | ICD-10-CM

## 2014-04-03 DIAGNOSIS — I6789 Other cerebrovascular disease: Secondary | ICD-10-CM | POA: Insufficient documentation

## 2014-04-03 DIAGNOSIS — R42 Dizziness and giddiness: Secondary | ICD-10-CM | POA: Insufficient documentation

## 2014-04-03 LAB — BASIC METABOLIC PANEL
BUN: 10 mg/dL (ref 6–23)
CO2: 22 mEq/L (ref 19–32)
Calcium: 8.8 mg/dL (ref 8.4–10.5)
Chloride: 102 mEq/L (ref 96–112)
Creat: 0.64 mg/dL (ref 0.50–1.10)
Glucose, Bld: 117 mg/dL — ABNORMAL HIGH (ref 70–99)
POTASSIUM: 3.3 meq/L — AB (ref 3.5–5.3)
SODIUM: 139 meq/L (ref 135–145)

## 2014-04-04 ENCOUNTER — Ambulatory Visit (HOSPITAL_COMMUNITY)
Admission: RE | Admit: 2014-04-04 | Discharge: 2014-04-04 | Disposition: A | Discharge status: Home / Self Care | Payer: Medicare Other | Source: Ambulatory Visit | Attending: Family Medicine | Admitting: Family Medicine

## 2014-04-04 ENCOUNTER — Other Ambulatory Visit (HOSPITAL_COMMUNITY): Payer: Self-pay | Admitting: Family Medicine

## 2014-04-04 DIAGNOSIS — R42 Dizziness and giddiness: Secondary | ICD-10-CM

## 2014-04-04 DIAGNOSIS — Z9181 History of falling: Secondary | ICD-10-CM | POA: Insufficient documentation

## 2014-04-04 DIAGNOSIS — R0989 Other specified symptoms and signs involving the circulatory and respiratory systems: Secondary | ICD-10-CM | POA: Insufficient documentation

## 2014-04-04 NOTE — Progress Notes (Signed)
VASCULAR LAB PRELIMINARY  PRELIMINARY  PRELIMINARY  PRELIMINARY  Carotid duplex  completed.    Preliminary report:  Bilateral:  1-39% ICA stenosis.  Vertebral artery flow is antegrade.      Nani Ravens, RVT 04/04/2014, 9:25 AM

## 2014-04-07 ENCOUNTER — Ambulatory Visit: Payer: Medicare Other | Admitting: Family Medicine

## 2014-04-11 ENCOUNTER — Ambulatory Visit (INDEPENDENT_AMBULATORY_CARE_PROVIDER_SITE_OTHER): Payer: Medicare Other | Admitting: Family Medicine

## 2014-04-11 ENCOUNTER — Ambulatory Visit: Payer: Medicare Other

## 2014-04-11 VITALS — BP 120/70 | HR 111 | Temp 98.9°F | Resp 16 | Ht <= 58 in | Wt 149.0 lb

## 2014-04-11 DIAGNOSIS — R05 Cough: Secondary | ICD-10-CM

## 2014-04-11 DIAGNOSIS — R059 Cough, unspecified: Secondary | ICD-10-CM

## 2014-04-11 DIAGNOSIS — S058X9A Other injuries of unspecified eye and orbit, initial encounter: Secondary | ICD-10-CM

## 2014-04-11 DIAGNOSIS — S0500XA Injury of conjunctiva and corneal abrasion without foreign body, unspecified eye, initial encounter: Secondary | ICD-10-CM

## 2014-04-11 DIAGNOSIS — J029 Acute pharyngitis, unspecified: Secondary | ICD-10-CM

## 2014-04-11 LAB — POCT CBC
Granulocyte percent: 68.7 %G (ref 37–80)
HCT, POC: 44.3 % (ref 37.7–47.9)
Hemoglobin: 14.2 g/dL (ref 12.2–16.2)
Lymph, poc: 1.8 (ref 0.6–3.4)
MCH, POC: 31.5 pg — AB (ref 27–31.2)
MCHC: 32.1 g/dL (ref 31.8–35.4)
MCV: 98.3 fL — AB (ref 80–97)
MID (cbc): 1 — AB (ref 0–0.9)
MPV: 10.2 fL (ref 0–99.8)
POC Granulocyte: 6.1 (ref 2–6.9)
POC LYMPH PERCENT: 20.1 %L (ref 10–50)
POC MID %: 11.2 %M (ref 0–12)
Platelet Count, POC: 274 10*3/uL (ref 142–424)
RBC: 4.51 M/uL (ref 4.04–5.48)
RDW, POC: 16.8 %
WBC: 8.9 10*3/uL (ref 4.6–10.2)

## 2014-04-11 MED ORDER — AMOXICILLIN 875 MG PO TABS: 875.0000 mg | ORAL_TABLET | Freq: Two times a day (BID) | ORAL | Status: DC

## 2014-04-11 MED ORDER — HYDROCOD POLST-CHLORPHEN POLST 10-8 MG/5ML PO LQCR: 5.0000 mL | Freq: Two times a day (BID) | ORAL | Status: DC | PRN

## 2014-04-11 NOTE — Patient Instructions (Signed)

## 2014-04-11 NOTE — Progress Notes (Addendum)
Subjective:    Patient ID: Brianna Duncan, female    DOB: 12-21-1945, 68 y.o.   MRN: [id number]  Cough Associated symptoms include a sore throat.   Chief Complaint  Patient presents with   Cough    x 1 week   Chest Congestion   This chart was scribed for Robyn Haber, MD by Thea Alken, ED Scribe. This patient was seen in room 11 and the patient's care was started at 2:04 PM.  HPI Comments: Brianna Duncan is a 68 y.o. female who presents to the [facility] complaining of a cough. She report waking up 4 days ago with sore throat described as stabbing pain. She states symptoms moved to her chest and that she now has chest congestion and  a productive cough consisting of green sputum 1 day ago. As of today she reports her sore throat has subsided. Pt reports cough keeps her awake at night. Pt reports pneumonia about 3 months ago diagnosed by Dr. Carlota Raspberry.    Past Medical History  Diagnosis Date   Hypertension    Allergy    Anxiety    Osteoporosis    Allergies  Allergen Reactions   Ivp Dye [Iodinated Diagnostic Agents] Hives   Prior to Admission medications   Medication Sig Start Date End Date Taking? Authorizing Provider  ALPRAZolam Duanne Moron) 0.5 MG tablet TAKE ONE TABLET BY MOUTH AT BEDTIME AS NEEDED FOR ANXIETY   Yes Wendie Agreste, MD  amLODipine (NORVASC) 10 MG tablet TAKE ONE TABLET BY MOUTH ONCE DAILY 01/27/14  Yes Wendie Agreste, MD  b complex vitamins tablet Take 1 tablet by mouth daily.   Yes Historical Provider, MD  calcium citrate-vitamin D 200-200 MG-UNIT TABS Take 1 tablet by mouth daily.   Yes Historical Provider, MD  cholecalciferol (VITAMIN D) 1000 UNITS tablet Take 1,000 Units by mouth daily.   Yes Historical Provider, MD  estradiol (ESTRACE VAGINAL) 0.1 MG/GM vaginal cream Place 2 g vaginally once a week.   Yes Historical Provider, MD  estradiol (VIVELLE-DOT) 0.075 MG/24HR Place 1 patch onto the skin 2 (two) times a week.    Yes Historical Provider, MD  glucosamine-chondroitin 500-400 MG tablet Take 2 tablets by mouth once.   Yes Historical Provider, MD  lisinopril-hydrochlorothiazide (PRINZIDE,ZESTORETIC) 20-12.5 MG per tablet TAKE ONE TABLET BY MOUTH ONCE DAILY 01/27/14  Yes Wendie Agreste, MD  therapeutic multivitamin-minerals [facility]) tablet Take 1 tablet by mouth daily.   Yes Historical Provider, MD  fluticasone (FLONASE) 50 MCG/ACT nasal spray Place 2 sprays into the nose daily. 01/18/12 01/17/13  Hayden Rasmussen, MD  sertraline (ZOLOFT) 50 MG tablet Take 1 tablet (50 mg total) by mouth daily. 01/27/14   Wendie Agreste, MD  Testosterone Propionate 2 % CREA Place onto the skin as needed.    Historical Provider, MD    Review of Systems  HENT: Positive for congestion and sore throat.   Respiratory: Positive for cough.   Psychiatric/Behavioral: Positive for sleep disturbance.       Objective:   Physical Exam  Nursing note and vitals reviewed. Constitutional: She is oriented to person, place, and time. She appears well-developed and well-nourished. No distress.  HENT:  Head: Normocephalic and atraumatic.  Eyes: EOM are normal.  Neck: Neck supple. No tracheal deviation present.  Cardiovascular: Normal rate.   Pulmonary/Chest: Effort normal. No respiratory distress.  Musculoskeletal: Normal range of motion.  Neurological: She is alert and oriented to person, place, and time.  Skin: Skin is warm and dry.  Psychiatric: She has a normal mood and affect. Her behavior is normal.   [facility] reading (PRIMARY) by  Dr. Joseph Art:  CXR-bilateral lower lobe atelectasis with some streakiness but no infiltrate.  Results for orders placed in visit on 04/11/14  POCT CBC      Result Value Ref Range   WBC 8.9  4.6 - 10.2 K/uL   Lymph, poc 1.8  0.6 - 3.4   POC LYMPH PERCENT 20.1  10 - 50 %L   MID (cbc) 1.0 (*) 0 - 0.9   POC MID % 11.2  0 - 12 %M   POC Granulocyte 6.1  2 - 6.9   Granulocyte percent 68.7  37 - 80 %G    RBC 4.51  4.04 - 5.48 M/uL   Hemoglobin 14.2  12.2 - 16.2 g/dL   HCT, POC 44.3  37.7 - 47.9 %   MCV 98.3 (*) 80 - 97 fL   MCH, POC 31.5 (*) 27 - 31.2 pg   MCHC 32.1  31.8 - 35.4 g/dL   RDW, POC 16.8     Platelet Count, POC 274  142 - 424 K/uL   MPV 10.2  0 - 99.8 fL    Assessment & Plan:     Cough - Plan: POCT CBC, DG Chest 2 View, amoxicillin (AMOXIL) 875 MG tablet, chlorpheniramine-HYDROcodone (TUSSIONEX PENNKINETIC ER) 10-8 MG/5ML LQCR  Corneal abrasion  Acute pharyngitis  Signed, Robyn Haber, MD

## 2014-04-15 ENCOUNTER — Ambulatory Visit: Payer: Medicare Other | Admitting: Neurology

## 2014-04-17 ENCOUNTER — Ambulatory Visit (INDEPENDENT_AMBULATORY_CARE_PROVIDER_SITE_OTHER): Payer: Medicare Other | Admitting: Neurology

## 2014-04-17 ENCOUNTER — Other Ambulatory Visit: Payer: Self-pay | Admitting: Neurology

## 2014-04-17 ENCOUNTER — Encounter: Payer: Self-pay | Admitting: Neurology

## 2014-04-17 VITALS — BP 132/83 | HR 104 | Ht <= 58 in | Wt 149.0 lb

## 2014-04-17 DIAGNOSIS — R413 Other amnesia: Secondary | ICD-10-CM

## 2014-04-17 DIAGNOSIS — R42 Dizziness and giddiness: Secondary | ICD-10-CM

## 2014-04-17 NOTE — Progress Notes (Signed)
PATIENT: Brianna Duncan DOB: October 06, 1946  HISTORICAL  SHERROL VICARS is a 68 years old right-handed Caucasian female, accompanied by her daughter and for evaluation of memory trouble, unbalanced gait, she is referred by her primary care physician Dr. 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, she has lived with her sister for many years, who is 73 years older than she is, she is very physically, and mentally active,  She suffered pneumonia in March 2015, and bronchitis again recently, taking multiple rounds of antibiotic, doing that of time, she was tired, want to sleep all the time, daughter noticed the significant change since May first 2015, she is less fatigued, less sleepy, but she was found to have less appetite, off-balance, word finding difficulties, delayed thinking process, repeating herself, become very talkative,  Patient denies significant change, overall is recovering from her acute illness, but daughter insistent of definite change of the patient, ", this is not my mother", desire further evaluation  She was evaluated at urgent care, for dizziness, nausea, 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   REVIEW OF SYSTEMS: Full 14 system review of systems performed and notable only for fever, chills, fatigue, swelling in legs, hearing loss, cough, wheezing, easy bruising, joint pain, energy, memory loss, confusion, slurred speech, tremor, sleepiness, anxiety, too much sleep, decreased energy, change in appetite, disinterested in activities  ALLERGIES: Allergies  Allergen Reactions  . Ivp Dye [Iodinated  Diagnostic Agents] Hives    HOME MEDICATIONS: Current Outpatient Prescriptions on File Prior to Visit  Medication Sig Dispense Refill  . amLODipine (NORVASC) 10 MG tablet TAKE ONE TABLET BY MOUTH ONCE DAILY  90 tablet  1  . amoxicillin (AMOXIL) 875 MG tablet Take 1 tablet (875 mg total) by mouth 2 (two) times daily.  20 tablet  0  . b complex vitamins tablet Take 1 tablet by mouth daily.      . calcium citrate-vitamin D 200-200 MG-UNIT TABS Take 1 tablet by mouth daily.      . chlorpheniramine-HYDROcodone (TUSSIONEX PENNKINETIC ER) 10-8 MG/5ML LQCR Take 5 mLs by mouth every 12 (twelve) hours as needed for cough.  60 mL  0  . cholecalciferol (VITAMIN D) 1000 UNITS tablet Take 1,000 Units by mouth daily.      Marland Kitchen estradiol (ESTRACE VAGINAL) 0.1 MG/GM vaginal cream Place 2 g vaginally once a week.      . estradiol (VIVELLE-DOT) 0.075 MG/24HR Place 1 patch onto the skin 2 (two) times a week.      Marland Kitchen lisinopril-hydrochlorothiazide (PRINZIDE,ZESTORETIC) 20-12.5 MG per tablet TAKE ONE TABLET BY MOUTH ONCE DAILY  90 tablet  1  . Testosterone Propionate 2 % CREA Place onto the skin as needed.      . therapeutic multivitamin-minerals (THERAGRAN-M) tablet Take 1 tablet by mouth daily.      . fluticasone (FLONASE) 50 MCG/ACT nasal spray Place 2 sprays into the nose daily.  1 g  6   No current facility-administered medications on file prior to visit.    PAST MEDICAL HISTORY: Past Medical History  Diagnosis Date  . Hypertension   . Allergy   .  Anxiety   . Osteoporosis     PAST SURGICAL HISTORY: Past Surgical History  Procedure Laterality Date  . Abdominal hysterectomy    . Foot surgery      right foot, left great toe and left second toe surgery    FAMILY HISTORY: Family History  Problem Relation Age of Onset  . Heart disease Mother   . Stroke Father   . Stroke Sister   . Heart disease Brother     SOCIAL HISTORY:  History   Social History  . Marital Status: Divorced    Spouse  Name: N/A    Number of Children: 1  . Years of Education: N/A   Occupational History  .      retired   Social History Main Topics  . Smoking status: Former Research scientist (life sciences)  . Smokeless tobacco: Never Used     Comment: quit 15  yrs ago  . Alcohol Use: Yes     Comment: occassionally  . Drug Use: No  . Sexual Activity: Yes    Birth Control/ Protection: None   Other Topics Concern  . Not on file   Social History Narrative   Patient lives at home with her sister and she is retired.   Education some college.   Right handed.   Caffeine three cups of coffee daily.     PHYSICAL EXAM   Filed Vitals:   04/17/14 0923  BP: 132/83  Pulse: 104  Height: 4\' 10"  (1.473 m)  Weight: 149 lb (67.586 kg)    Not recorded    Body mass index is 31.15 kg/(m^2).   Generalized: In no acute distress  Neck: Supple, no carotid bruits   Cardiac: Regular rate rhythm  Pulmonary: Clear to auscultation bilaterally  Musculoskeletal: No deformity  Neurological examination  Mentation: Alert oriented to time, place, history taking, and causual conversation, Mini-Mental Status Examination is 29 out of 30, she think this is may 20 instead of 21  Cranial nerve II-XII: Pupils were equal round reactive to light. Extraocular movements were full.  Visual field were full on confrontational test. Bilateral fundi were sharp.  Facial sensation and strength were normal. Hearing was intact to finger rubbing bilaterally. Uvula tongue midline.  Head turning and shoulder shrug and were normal and symmetric.Tongue protrusion into cheek strength was normal.  Motor: Normal tone, bulk and strength.  Sensory: Intact to fine touch, pinprick, preserved vibratory sensation, and proprioception at toes.  Coordination: Normal finger to nose, heel-to-shin bilaterally there was no truncal ataxia  Gait: Rising up from seated position without assistance, normal stance, without trunk ataxia, moderate stride, good arm swing, smooth  turning, able to perform tiptoe, and heel walking without difficulty.   Romberg signs: Negative  Deep tendon reflexes: Brachioradialis 2/2, biceps 2/2, triceps 2/2, patellar 2/2, Achilles 2/2, plantar responses were flexor bilaterally.   DIAGNOSTIC DATA (LABS, IMAGING, TESTING) - I reviewed patient records, labs, notes, testing and imaging myself where available.  Lab Results  Component Value Date   WBC 8.9 04/11/2014   HGB 14.2 04/11/2014   HCT 44.3 04/11/2014   MCV 98.3* 04/11/2014      Component Value Date/Time   NA 139 04/02/2014 1720   K 3.3* 04/02/2014 1720   CL 102 04/02/2014 1720   CO2 22 04/02/2014 1720   GLUCOSE 117* 04/02/2014 1720   BUN 10 04/02/2014 1720   CREATININE 0.64 04/02/2014 1720   CALCIUM 8.8 04/02/2014 1720   No results found for this basename: CHOL, HDL, LDLCALC, LDLDIRECT,  TRIG, CHOLHDL   Lab Results  Component Value Date   HGBA1C 5.2 04/02/2014   No results found for this basename: BHALPFXT02   Lab Results  Component Value Date   TSH 2.184 04/02/2014      ASSESSMENT AND PLAN  Emberlee Sortino Deshaies is a 68 y.o. female presenting with short-term memory trouble, unsteady gait, also complicated by her recent pneumonia in March, bronchitis recently, overall is improving, CAT scan of the brain showed mild small vessel disease,  1, short-term memory trouble, differentiation diagnosis including decompensation from acute illness, vs. mild cognitive impairment, 2. MRI of brain 3. Laboratory evaluations, B12, inflammatory markers 4, return to clinic in 1-2 months    Marcial Pacas, M.D. Ph.D.  [facility] [address] [place], [place] [address] [phone]

## 2014-04-18 ENCOUNTER — Ambulatory Visit (INDEPENDENT_AMBULATORY_CARE_PROVIDER_SITE_OTHER): Payer: Medicare Other | Admitting: Family Medicine

## 2014-04-18 VITALS — BP 138/70 | HR 110 | Temp 99.0°F | Resp 20 | Ht 59.0 in | Wt 147.0 lb

## 2014-04-18 DIAGNOSIS — R61 Generalized hyperhidrosis: Secondary | ICD-10-CM

## 2014-04-18 DIAGNOSIS — R059 Cough, unspecified: Secondary | ICD-10-CM

## 2014-04-18 DIAGNOSIS — R05 Cough: Secondary | ICD-10-CM

## 2014-04-18 DIAGNOSIS — R110 Nausea: Secondary | ICD-10-CM

## 2014-04-18 DIAGNOSIS — E119 Type 2 diabetes mellitus without complications: Secondary | ICD-10-CM

## 2014-04-18 LAB — CK: Total CK: 257 U/L — ABNORMAL HIGH (ref 24–173)

## 2014-04-18 LAB — ANA W/REFLEX IF POSITIVE
Anti JO-1: <0.2 AI (ref 0.0–0.9)
Anti Nuclear Antibody(ANA): POSITIVE — AB
Chromatin Ab SerPl-aCnc: 0.2 AI (ref 0.0–0.9)
ENA RNP Ab: 7.4 AI — ABNORMAL HIGH (ref 0.0–0.9)
ENA SM Ab Ser-aCnc: <0.2 AI (ref 0.0–0.9)
ENA SSA (RO) AB: 0.5 AI (ref 0.0–0.9)
ENA SSB (LA) Ab: <0.2 AI (ref 0.0–0.9)
Scleroderma SCL-70: 0.7 AI (ref 0.0–0.9)
dsDNA Ab: <1 IU/mL (ref 0–9)

## 2014-04-18 LAB — RPR: SYPHILIS RPR SCR: NONREACTIVE

## 2014-04-18 LAB — C-REACTIVE PROTEIN: CRP: 31.8 mg/L — ABNORMAL HIGH (ref 0.0–4.9)

## 2014-04-18 LAB — FOLATE: Folate: 16.2 ng/mL (ref >3.0)

## 2014-04-18 LAB — VITAMIN B12: Vitamin B-12: 1620 pg/mL — ABNORMAL HIGH (ref 211–946)

## 2014-04-18 MED ORDER — PROMETHAZINE HCL 12.5 MG PO TABS: 12.5000 mg | ORAL_TABLET | Freq: Two times a day (BID) | ORAL | Status: DC | PRN

## 2014-04-18 MED ORDER — HYDROCOD POLST-CHLORPHEN POLST 10-8 MG/5ML PO LQCR: 5.0000 mL | Freq: Two times a day (BID) | ORAL | Status: DC | PRN

## 2014-04-18 MED ORDER — AMOXICILLIN-POT CLAVULANATE 875-125 MG PO TABS: 1.0000 | ORAL_TABLET | Freq: Two times a day (BID) | ORAL | Status: DC

## 2014-04-18 NOTE — Progress Notes (Signed)
68 yo woman retired 1 year ago who is here for follow up of cough and sinus congestion.  Her sister remarks that her voice is wavering.  She saw neurologist yesterday who has recommended and will set up a brain MRI.  No diagnosis or assessment available.  Having hot and cold spells at night.   Objective:  NAD HEENT: swollen nasal passages, mildly inflamed, no mucopurulent discharge, TM's okay, throat clear Chest: few basilar rales Heart: regular, no murmur Skin: no rash Extrem: no edema Wt Readings from Last 3 Encounters:  04/18/14 147 lb (66.679 kg)  04/17/14 149 lb (67.586 kg)  04/11/14 149 lb (67.586 kg)   Temp Readings from Last 3 Encounters:  04/18/14 99 F (37.2 C)   04/11/14 98.9 F (37.2 C) Oral  04/02/14 98.7 F (37.1 C) Oral   BP Readings from Last 3 Encounters:  04/18/14 138/70  04/17/14 132/83  04/11/14 120/70   Pulse Readings from Last 3 Encounters:  04/18/14 110  04/17/14 104  04/11/14 111    Night sweats - Plan: amoxicillin-clavulanate (AUGMENTIN) 875-125 MG per tablet  Cough - Plan: chlorpheniramine-HYDROcodone (TUSSIONEX PENNKINETIC ER) 10-8 MG/5ML LQCR, amoxicillin-clavulanate (AUGMENTIN) 875-125 MG per tablet  Nausea alone - Plan: promethazine (PHENERGAN) 12.5 MG tablet  Signed, Robyn Haber, MD

## 2014-04-20 ENCOUNTER — Other Ambulatory Visit: Payer: Self-pay | Admitting: Family Medicine

## 2014-04-22 NOTE — Telephone Encounter (Signed)
Called in. Notified pt on VM Rx sent and need for f/up

## 2014-04-22 NOTE — Telephone Encounter (Signed)
Refilled, but needs follow up ov in next few weeks.

## 2014-04-25 ENCOUNTER — Telehealth: Payer: Self-pay | Admitting: Neurology

## 2014-04-25 NOTE — Telephone Encounter (Signed)
Butch Penny: Please call patient, laboratory showed elevated inflammatory markers, could be due to her most recent bronchitis,/pneumonia, continue followup

## 2014-04-30 NOTE — Telephone Encounter (Signed)
Left message with labs showing elevated inflammatory markers, possible due to recent illness, per Dr. Krista Blue.  Told to keep follow up and call with any questions.

## 2014-05-04 ENCOUNTER — Ambulatory Visit
Admission: RE | Admit: 2014-05-04 | Discharge: 2014-05-04 | Disposition: A | Discharge status: Home / Self Care | Payer: Medicare Other | Source: Ambulatory Visit | Attending: Neurology | Admitting: Neurology

## 2014-05-04 DIAGNOSIS — R413 Other amnesia: Secondary | ICD-10-CM

## 2014-05-07 ENCOUNTER — Telehealth: Payer: Self-pay | Admitting: Neurology

## 2014-05-07 NOTE — Telephone Encounter (Signed)
Please call patient, mild age related changes on MRI, no acute lesions, further questions to be answered at her follow up visit.

## 2014-05-09 NOTE — Telephone Encounter (Signed)
Patient returning call to Jayme Cloud., please call and advise.

## 2014-05-09 NOTE — Telephone Encounter (Signed)
Called patient and left message call back if she needs to please keep follow up with Dr. Krista Blue and Dr.Yan will go over MRI results at follow up appt.

## 2014-05-09 NOTE — Telephone Encounter (Signed)
Patient returning call regarding MRI results. °

## 2014-05-12 ENCOUNTER — Encounter: Payer: Self-pay | Admitting: Family Medicine

## 2014-05-12 ENCOUNTER — Ambulatory Visit (INDEPENDENT_AMBULATORY_CARE_PROVIDER_SITE_OTHER): Payer: Medicare Other | Admitting: Family Medicine

## 2014-05-12 VITALS — BP 110/64 | HR 77 | Temp 98.7°F | Resp 16 | Ht 59.0 in | Wt 150.2 lb

## 2014-05-12 DIAGNOSIS — R42 Dizziness and giddiness: Secondary | ICD-10-CM

## 2014-05-12 DIAGNOSIS — R000 Tachycardia, unspecified: Secondary | ICD-10-CM

## 2014-05-12 DIAGNOSIS — F418 Other specified anxiety disorders: Secondary | ICD-10-CM

## 2014-05-12 DIAGNOSIS — F411 Generalized anxiety disorder: Secondary | ICD-10-CM

## 2014-05-12 DIAGNOSIS — R05 Cough: Secondary | ICD-10-CM

## 2014-05-12 DIAGNOSIS — J309 Allergic rhinitis, unspecified: Secondary | ICD-10-CM

## 2014-05-12 DIAGNOSIS — Z9181 History of falling: Secondary | ICD-10-CM

## 2014-05-12 DIAGNOSIS — R059 Cough, unspecified: Secondary | ICD-10-CM

## 2014-05-12 NOTE — Progress Notes (Signed)
Subjective:    Patient ID: Brianna Duncan, female    DOB: 07/03/1946, 68 y.o.   MRN: [id number]  HPI Brianna Duncan is a 68 y.o. female  See last ov - multiple issues addressed at that time and second visit same day to discuss various concerns. Here for follow up.   Cough   -Hx of RLL PNA in July 2014.  initially improved, then treated more recently with flonase for AR. Improved after Flonase, then seen after feeling of sore throat by Dr. Joseph Art 04/11/14.  Had CXR 04/11/14 - stable scarring vs atelectasis left lung base. Treated with amoxicillin and tussionex. Improving, then seen again 04/18/14 again by Dr. Joseph Art - felt about the same - ? low grade fever - treated with Augmentin then for 10 days.  Felt better. Cough was improving, but yesterday and today - feels like cough is coming back.  No fever. Dry cough, not productive. Blowing a lot of green congestion out of nose the entire time over past 3 weeks, but less congestion today. Last green d/c from nose few days ago. Does notice PND recently. Has not used flonase last few days.   Still feels fatigued, but improving.   Dizziness, with history of falls.    -see prior notes. CT head without acute findings. mild small vessel disease. Doppler of carotids 04/04/14 without significant stenosis. Neuro eval with Dr. Krista Blue 04/17/14: short-term memory trouble, differentiation diagnosis including decompensation from acute illness, vs. mild cognitive impairment.  Had MRI of brain 04/17/14. Few age related changes, follow up with Neuro 05/28/14.  Had elevated inflammatory markers - possibly d/t recent illness. No falls since last ov. Not feeling dizzy recently. No recent memory changes - feels better, stronger.   Tachycardia - has appt. with Dr. Einar Gip  In July. Improved today. No chest pains or palpitations recently.    Depression/situtaional anxiety   - anxiety component as well as some familial stressors. Possible repressed concerns form  sister's passing discussed at last ov,, but encouraged family counseling with dynamics noted in office and to help not only with Brianna Duncan but to help with communication of her and Brianna Duncan's concerns. Discussed decreasing alcohol as this was self medicating.  Xanax if needed, with caution of side effects and to not combine with alcohol.  Has not met with counselor. Taking xanax only a few times per week.  Daughter not here today - but per Posey Pronto, Brianna Duncan did not have any questions for me today. Energy improving feels like getting along better with Brianna Duncan.  Drinking wine few times per week, only 1 glass at a time now. Feeling much better about things.    Review of Systems  HENT: Positive for congestion and postnasal drip.   Respiratory: Positive for cough.   Cardiovascular: Negative for chest pain.  Neurological: Negative for dizziness and syncope.   As above in HPI.    Objective:   Physical Exam  Vitals reviewed. Constitutional: She is oriented to person, place, and time. She appears well-developed and well-nourished.  HENT:  Head: Normocephalic and atraumatic.  Nose: Mucosal edema present. Right sinus exhibits no maxillary sinus tenderness and no frontal sinus tenderness. Left sinus exhibits no maxillary sinus tenderness and no frontal sinus tenderness.  Eyes: Conjunctivae and EOM are normal. Pupils are equal, round, and reactive to light.  Neck: Carotid bruit is not present.  Cardiovascular: Normal rate, regular rhythm, normal heart sounds and intact distal pulses.   Pulmonary/Chest: Effort normal and breath sounds normal.  Abdominal: Soft. She exhibits no pulsatile midline mass. There is no tenderness.  Neurological: She is alert and oriented to person, place, and time. She has normal strength. No sensory deficit. She displays a negative Romberg sign. Coordination and gait normal.  No pronator drift.   Skin: Skin is warm and dry.  Psychiatric: She has a normal mood and affect. Her behavior is  normal. Judgment and thought content normal.       Assessment & Plan:  Brianna Duncan is a 68 y.o. female Cough, prior treated with Augmentin with improvement. Suspect recent increase with PND from coming off flonase and underlying allergic rhinitis.  Restart flonase.if symptoms persist, or any worsening cough- rtc for recheck.   Dizziness, History of fall - asx recently.  Age related changes on CT/MRI brain.  Has follow up planned with Neuro. Recommended to continue to avoid more than 1 glass of wine as this may have contributed. rtc precautions.   Tachycardia - improved today. Has follow up planned with cardiology. rtc precautions.   Situational anxiety - improved, but discussed counseling still may be helpful if flair of sx's and family dynamics, but again - she feels this is improved recently.   No orders of the defined types were placed in this encounter.   Patient Instructions  Restart flonase nasal spray - everyday.  If cough not improving with this in 1 week - return for recheck. Return to the clinic or go to the nearest emergency room if any of your symptoms worsen or new symptoms occur.  Follow up with me in 2 -3 weeks (or after your neurology visit). Return to the clinic or go to the nearest emergency room if any of your symptoms worsen or new symptoms occur.    Keep appointments with Dr. Krista Blue and Dr. Einar Gip.

## 2014-05-12 NOTE — Patient Instructions (Signed)
Restart flonase nasal spray - everyday.  If cough not improving with this in 1 week - return for recheck. Return to the clinic or go to the nearest emergency room if any of your symptoms worsen or new symptoms occur.  Follow up with me in 2 -3 weeks (or after your neurology visit). Return to the clinic or go to the nearest emergency room if any of your symptoms worsen or new symptoms occur.    Keep appointments with Dr. Krista Blue and Dr. Einar Gip.

## 2014-05-12 NOTE — Telephone Encounter (Signed)
Called pt and left message stating per Hinton Dyer, [place] Dr. Rhea Belton assistant that Dr. Krista Blue will be going over pt's MRI results at f/u appt on 05/28/14 and if she has any other problems, questions or concerns to call the office.

## 2014-05-21 ENCOUNTER — Ambulatory Visit: Payer: Medicare Other | Admitting: Neurology

## 2014-06-06 ENCOUNTER — Ambulatory Visit: Payer: Medicare Other | Admitting: Neurology

## 2014-06-09 ENCOUNTER — Ambulatory Visit: Payer: Medicare Other | Admitting: Family Medicine

## 2014-06-16 ENCOUNTER — Other Ambulatory Visit: Payer: Self-pay | Admitting: Family Medicine

## 2014-06-16 NOTE — Telephone Encounter (Signed)
Refilled, but should follow up in next month.

## 2014-06-17 ENCOUNTER — Emergency Department (HOSPITAL_COMMUNITY): Payer: Medicare Other

## 2014-06-17 ENCOUNTER — Emergency Department (HOSPITAL_COMMUNITY)
Admission: EM | Admit: 2014-06-17 | Discharge: 2014-06-17 | Disposition: A | Discharge status: Home / Self Care | Payer: Medicare Other | Attending: Emergency Medicine | Admitting: Emergency Medicine

## 2014-06-17 ENCOUNTER — Encounter (HOSPITAL_COMMUNITY): Payer: Self-pay | Admitting: Emergency Medicine

## 2014-06-17 DIAGNOSIS — Z87891 Personal history of nicotine dependence: Secondary | ICD-10-CM | POA: Insufficient documentation

## 2014-06-17 DIAGNOSIS — K769 Liver disease, unspecified: Secondary | ICD-10-CM

## 2014-06-17 DIAGNOSIS — R000 Tachycardia, unspecified: Secondary | ICD-10-CM | POA: Insufficient documentation

## 2014-06-17 DIAGNOSIS — R6883 Chills (without fever): Secondary | ICD-10-CM | POA: Insufficient documentation

## 2014-06-17 DIAGNOSIS — K6389 Other specified diseases of intestine: Secondary | ICD-10-CM | POA: Diagnosis not present

## 2014-06-17 DIAGNOSIS — R1110 Vomiting, unspecified: Secondary | ICD-10-CM

## 2014-06-17 DIAGNOSIS — R7989 Other specified abnormal findings of blood chemistry: Secondary | ICD-10-CM

## 2014-06-17 DIAGNOSIS — K625 Hemorrhage of anus and rectum: Secondary | ICD-10-CM | POA: Diagnosis present

## 2014-06-17 DIAGNOSIS — F411 Generalized anxiety disorder: Secondary | ICD-10-CM | POA: Insufficient documentation

## 2014-06-17 DIAGNOSIS — M810 Age-related osteoporosis without current pathological fracture: Secondary | ICD-10-CM | POA: Insufficient documentation

## 2014-06-17 DIAGNOSIS — Z79899 Other long term (current) drug therapy: Secondary | ICD-10-CM | POA: Diagnosis not present

## 2014-06-17 DIAGNOSIS — D72829 Elevated white blood cell count, unspecified: Secondary | ICD-10-CM | POA: Diagnosis not present

## 2014-06-17 DIAGNOSIS — R112 Nausea with vomiting, unspecified: Secondary | ICD-10-CM | POA: Diagnosis not present

## 2014-06-17 DIAGNOSIS — R197 Diarrhea, unspecified: Secondary | ICD-10-CM

## 2014-06-17 DIAGNOSIS — K7689 Other specified diseases of liver: Secondary | ICD-10-CM | POA: Diagnosis not present

## 2014-06-17 DIAGNOSIS — I10 Essential (primary) hypertension: Secondary | ICD-10-CM | POA: Diagnosis not present

## 2014-06-17 DIAGNOSIS — R945 Abnormal results of liver function studies: Secondary | ICD-10-CM

## 2014-06-17 LAB — COMPREHENSIVE METABOLIC PANEL
ALK PHOS: 214 U/L — AB (ref 39–117)
ALT: 78 U/L — AB (ref 0–35)
AST: 101 U/L — ABNORMAL HIGH (ref 0–37)
Albumin: 3.5 g/dL (ref 3.5–5.2)
Anion gap: 17 — ABNORMAL HIGH (ref 5–15)
BILIRUBIN TOTAL: 1.2 mg/dL (ref 0.3–1.2)
BUN: 13 mg/dL (ref 6–23)
CHLORIDE: 93 meq/L — AB (ref 96–112)
CO2: 22 meq/L (ref 19–32)
Calcium: 10.1 mg/dL (ref 8.4–10.5)
Creatinine, Ser: 0.79 mg/dL (ref 0.50–1.10)
GFR, EST NON AFRICAN AMERICAN: 84 mL/min — AB (ref >90)
GLUCOSE: 125 mg/dL — AB (ref 70–99)
POTASSIUM: 3.5 meq/L — AB (ref 3.7–5.3)
Sodium: 132 mEq/L — ABNORMAL LOW (ref 137–147)
Total Protein: 8.2 g/dL (ref 6.0–8.3)

## 2014-06-17 LAB — CBC
HCT: 42.1 % (ref 36.0–46.0)
Hemoglobin: 14 g/dL (ref 12.0–15.0)
MCH: 32.6 pg (ref 26.0–34.0)
MCHC: 33.3 g/dL (ref 30.0–36.0)
MCV: 98.1 fL (ref 78.0–100.0)
Platelets: 236 10*3/uL (ref 150–400)
RBC: 4.29 MIL/uL (ref 3.87–5.11)
RDW: 13.6 % (ref 11.5–15.5)
WBC: 23.2 10*3/uL — ABNORMAL HIGH (ref 4.0–10.5)

## 2014-06-17 LAB — ABO/RH: ABO/RH(D): A POS

## 2014-06-17 LAB — TYPE AND SCREEN
ABO/RH(D): A POS
Antibody Screen: NEGATIVE

## 2014-06-17 MED ORDER — ONDANSETRON HCL 4 MG/2ML IJ SOLN
4.0000 mg | Freq: Once | INTRAMUSCULAR | Status: AC
  Administered 2014-06-17: 4 mg via INTRAVENOUS
  Filled 2014-06-17: qty 2

## 2014-06-17 MED ORDER — HYDROCODONE-ACETAMINOPHEN 5-325 MG PO TABS: 1.0000 | ORAL_TABLET | ORAL | Status: DC | PRN

## 2014-06-17 MED ORDER — MORPHINE SULFATE 4 MG/ML IJ SOLN
4.0000 mg | Freq: Once | INTRAMUSCULAR | Status: AC
  Administered 2014-06-17: 4 mg via INTRAVENOUS
  Filled 2014-06-17: qty 1

## 2014-06-17 MED ORDER — ONDANSETRON 4 MG PO TBDP: 4.0000 mg | ORAL_TABLET | Freq: Three times a day (TID) | ORAL | Status: DC | PRN

## 2014-06-17 MED ORDER — SODIUM CHLORIDE 0.9 % IV BOLUS (SEPSIS)
1000.0000 mL | Freq: Once | INTRAVENOUS | Status: AC
  Administered 2014-06-17: 1000 mL via INTRAVENOUS

## 2014-06-17 NOTE — ED Notes (Signed)
Pt placed in gown and in bed. Pt monitored by pulse ox, bp cuff, and 5-lead. 

## 2014-06-17 NOTE — Discharge Instructions (Signed)
You were seen and evaluated for your nausea, vomiting and diarrhea symptoms with abdominal discomfort. Your CAT scan today showed concerning findings for lesions in your colon and liver. These findings are concerning for possible cancer and it is recommended that you followup with your GI specialist to have a colonoscopy and further evaluation of your findings. Please also followup with your primary care provider. Return any time for changing or worsening symptoms.   Colorectal Cancer Colorectal cancer is an abnormal growth of tissue (tumor) in the colon or rectum that is cancerous (malignant). Unlike noncancerous (benign) tumors, malignant tumors can spread to other parts of your body. The colon is the large bowel or large intestine. The rectum is the last several inches of the colon.  RISK FACTORS The exact cause of colorectal cancer is unknown. However, the following factors may increase your chances of getting colorectal cancer:   Age older than 16 years.   Abnormal growths (polyps) on the inner wall of the colon or rectum.   Diabetes.   African American race.   Family history of hereditary nonpolyposis colorectal cancer. This condition is caused by changes in the genes that are responsible for repairing mismatched DNA.   Personal history of cancer. A person who has already had colorectal cancer may develop it a second time. Also, women with a history of ovarian, uterine, or breast cancer are at a somewhat higher risk of developing colorectal cancer.  Certain hereditary conditions.  Eating a diet that is high in fat (especially animal fat) and low in fiber, fruits, and vegetables.  Sedentary lifestyle.  Inflammatory bowel disease, including ulcerative colitis and Crohn's disease.   Smoking.   Excessive alcohol use.  SYMPTOMS Early colorectal cancer often does not cause symptoms. As the cancer grows, symptoms may include:   Changes in bowel habits.  Diarrhea.    Constipation.   Feeling like the bowel does not empty completely after a bowel movement.   Blood in the stool.   Stools that are narrower than usual.   Abdominal discomfort, pain, bloating, fullness, or cramps.  Frequent gas pain.   Unexplained weight loss.   Constant tiredness.   Nausea and vomiting.  DIAGNOSIS  Your health care provider will ask about your medical history. He or she may also perform a number of procedures, such as:   A physical exam.  A digital rectal exam.  A fecal occult blood test.  A barium enema.  Blood tests.   X-rays.   Imaging tests, such as CT scans or MRIs.   Taking a tissue sample (biopsy) from your colon or rectum to look for cancer cells.   A sigmoidoscopy to view the inside of the last part of your colon.   A colonoscopy to view the inside of your entire colon.   An endorectal ultrasound to see how deep a rectal tumor has grown and whether the cancer has spread to lymph nodes or other nearby tissues.  Your cancer will be staged to determine its severity and extent. Staging is a careful attempt to find out the size of the tumor, whether the cancer has spread, and if so, to what parts of the body. You may need to have more tests to determine the stage of your cancer. The test results will help determine what treatment plan is best for you.   Stage 0--The cancer is found only in the innermost lining of the colon or rectum.   Stage I--The cancer has grown into the inner wall  of the colon or rectum. The cancer has not yet reached the outer wall of the colon.   Stage II--The cancer extends more deeply into or through the wall of the colon or rectum. It may have invaded nearby tissue, but cancer cells have not spread to the lymph nodes.   Stage III--The cancer has spread to nearby lymph nodes but not to other parts of the body.   Stage IV--The cancer has spread to other parts of the body, such as the liver or  lungs.  Your health care provider may tell you the detailed stage of your cancer, which includes both a number and a letter.  TREATMENT  Depending on the type and stage, colorectal cancer may be treated with surgery, radiation therapy, chemotherapy, targeted therapy, or radiofrequency ablation. Some people have a combination of these therapies. Surgery may be done to remove the polyps from your colon. In early stages, your health care provider may be able to do this during a colonoscopy. In later stages, surgery may be done to remove part of your colon.  HOME CARE INSTRUCTIONS   Only take over-the-counter or prescription medicines for pain, discomfort, or fever as directed by your health care provider.   Maintain a healthy diet.   Consider joining a support group. This may help you learn to cope with the stress of having colorectal cancer.   Seek advice to help you manage treatment of side effects.   Keep all follow-up appointments as directed by your health care provider.   Inform your cancer specialist if you are admitted to the hospital.  SEEK MEDICAL CARE IF:  Your diarrhea or constipation does not go away.   Your bowel habits change.  You have increased abdominal pain.   You notice new fatigue or weakness.  You lose weight. Document Released: 11/14/2005 Document Revised: 11/19/2013 Document Reviewed: 05/09/2013 [facility] Patient Information 2015 [place], [place]. This information is not intended to replace advice given to you by your health care provider. Make sure you discuss any questions you have with your health care provider.

## 2014-06-17 NOTE — ED Notes (Signed)
She states "i started to have vomiting, diarrhea, and chills on Sunday night. Monday I saw a little bit of bright red blood on toilet paper. Then today i noticed darker blood in the toilet after i had a bowel movement."

## 2014-06-17 NOTE — ED Provider Notes (Signed)
CSN: [id number]     Arrival date & time 06/17/14  1226 History   First MD Initiated Contact with Patient 06/17/14 1647     Chief Complaint  Patient presents with  . Rectal Bleeding     (Consider location/radiation/quality/duration/timing/severity/associated sxs/prior Treatment) HPI  This a 68 year old female who presents with nausea, vomiting, and chills since Sunday night. History of hypertension. Patient reports that she began to have nonbilious, nonbloody emesis and nonbloody diarrhea on Saturday. She reports abdominal soreness which she relates to recurrent vomiting. She reports that his lower tenderness on the right side. Currently it is 2/10. Nothing makes it better or worse. She is noted blood on the toilet paper when she wipes. She denies any dizziness or weakness. She feels dehydrated.  Past Medical History  Diagnosis Date  . Hypertension   . Allergy   . Anxiety   . Osteoporosis    Past Surgical History  Procedure Laterality Date  . Abdominal hysterectomy    . Foot surgery      right foot, left great toe and left second toe surgery   Family History  Problem Relation Age of Onset  . Heart disease Mother   . Stroke Father   . Stroke Sister   . Heart disease Brother    History  Substance Use Topics  . Smoking status: Former Research scientist (life sciences)  . Smokeless tobacco: Never Used     Comment: quit 15  yrs ago  . Alcohol Use: Yes     Comment: occassionally   OB History   Grav Para Term Preterm Abortions TAB SAB Ect Mult Living                 Review of Systems  Constitutional: Positive for chills. Negative for fever.  Respiratory: Negative for cough, chest tightness and shortness of breath.   Cardiovascular: Negative for chest pain.  Gastrointestinal: Positive for nausea, vomiting, abdominal pain, diarrhea and blood in stool.  Genitourinary: Negative for dysuria.  Musculoskeletal: Negative for back pain.  Neurological: Negative for headaches.  Psychiatric/Behavioral:  Negative for confusion.  All other systems reviewed and are negative.     Allergies  Ivp dye  Home Medications   Prior to Admission medications   Medication Sig Start Date End Date Taking? Authorizing Provider  ALPRAZolam Duanne Moron) 0.5 MG tablet Take 0.5 mg by mouth at bedtime as needed for anxiety.   Yes Historical Provider, MD  amLODipine (NORVASC) 10 MG tablet Take 10 mg by mouth daily.   Yes Historical Provider, MD  calcium citrate-vitamin D 200-200 MG-UNIT TABS Take 1 tablet by mouth daily.   Yes Historical Provider, MD  estradiol (ESTRACE VAGINAL) 0.1 MG/GM vaginal cream Place 2 g vaginally once a week.   Yes Historical Provider, MD  estradiol (VIVELLE-DOT) 0.05 MG/24HR patch Place 1 patch onto the skin 2 (two) times a week.   Yes Historical Provider, MD  lisinopril-hydrochlorothiazide (PRINZIDE,ZESTORETIC) 20-12.5 MG per tablet Take 1 tablet by mouth daily.   Yes Historical Provider, MD  therapeutic multivitamin-minerals (THERAGRAN-M) tablet Take 1 tablet by mouth daily.    Historical Provider, MD   BP 125/58  Pulse 110  Temp(Src) 99.9 F (37.7 C) (Oral)  Resp 17  SpO2 94% Physical Exam  Nursing note and vitals reviewed. Constitutional: She is oriented to person, place, and time. She appears well-developed and well-nourished. No distress.  HENT:  Head: Normocephalic and atraumatic.  Eyes: Pupils are equal, round, and reactive to light.  Cardiovascular: Regular rhythm and normal heart sounds.  No murmur heard. Tachycardic  Pulmonary/Chest: Effort normal and breath sounds normal. No respiratory distress. She has no wheezes.  Abdominal: Soft. Bowel sounds are normal. There is tenderness. There is no rebound and no guarding.  Musculoskeletal: She exhibits no edema.  Neurological: She is alert and oriented to person, place, and time.  Skin: Skin is warm and dry.  Psychiatric: She has a normal mood and affect.    ED Course  Procedures (including critical care time) Labs  Review Labs Reviewed  CBC - Abnormal; Notable for the following:    WBC 23.2 (*)    All other components within normal limits  COMPREHENSIVE METABOLIC PANEL - Abnormal; Notable for the following:    Sodium 132 (*)    Potassium 3.5 (*)    Chloride 93 (*)    Glucose, Bld 125 (*)    AST 101 (*)    ALT 78 (*)    Alkaline Phosphatase 214 (*)    GFR calc non Af Amer 84 (*)    Anion gap 17 (*)    All other components within normal limits  POC OCCULT BLOOD, ED  TYPE AND SCREEN  ABO/RH    Imaging Review No results found.   EKG Interpretation None      MDM   Final diagnoses:  None    Patient presents with chills, vomiting and diarrhea since Sunday.  Nontoxic on exam.  APpears dry.  Pulse 120 and BP 96/64.  Patient given fluids and pain/nausea meds.  Labwork notable for leukocytosis to 23.  Mild transaminitis.  Appears dry with hyponatremia/hypochloremia.  HGB 14 and stable.  CT scan ordered to evaluate for diverticulitis, colitis.  Patient reports improvement of symptoms with fluids and medication.  HR and BP improved with fluids.  NO active emesis while in ED.    Signed out to Virgina Evener, PA pending CT.  If CT reassuring and patient feeling better, ok to D/C with close follow-up.  Otherwise, per CT.    Merryl Hacker, MD 06/18/14 [phone]

## 2014-06-17 NOTE — ED Provider Notes (Signed)
Brianna Duncan S 8:30 PM patient discussed in sign out. Patient with episodes of nausea, vomiting, diarrhea and chills starting Sunday night. Was also seen small amounts of bright red blood on the toilet paper after diarrhea. Symptoms associated with some abdominal cramping. Patient with elevated WBC and slightly elevated LFTs. CT scan pending.  CT scan worrisome for possible metastatic colon cancer. There are several small lesions within the colon as well as lesions in the liver. This may explain elevated LFTs and elevated WBC. Findings do not appear like infectious colitis.  I discussed the findings with the patient and the concerns for cancer. She does see Dr. Daisey Must with GI and states she was supposed to have a colonoscopy earlier this year but had pneumonia and bronchitis illnesses which postponed the procedure. She is feeling well at this time she is requesting to return home. I stressed the importance to call the GI office tomorrow first thing to schedule followup colonoscopy and evaluation of the findings. We'll also provide general surgery referral. Patient expressed her understanding.  Martie Lee, PA-C 06/17/14 2142

## 2014-06-17 NOTE — Telephone Encounter (Signed)
Faxed. Notified pt on VM f/up needed for more.

## 2014-06-18 NOTE — ED Provider Notes (Signed)
Medical screening examination/treatment/procedure(s) were performed by non-physician practitioner and as supervising physician I was immediately available for consultation/collaboration.   EKG Interpretation None        Varney Biles, MD 06/18/14 [phone]

## 2014-06-24 ENCOUNTER — Other Ambulatory Visit (HOSPITAL_COMMUNITY): Payer: Self-pay | Admitting: Gastroenterology

## 2014-06-24 DIAGNOSIS — R9389 Abnormal findings on diagnostic imaging of other specified body structures: Secondary | ICD-10-CM

## 2014-06-24 DIAGNOSIS — R7989 Other specified abnormal findings of blood chemistry: Secondary | ICD-10-CM

## 2014-06-24 DIAGNOSIS — R945 Abnormal results of liver function studies: Principal | ICD-10-CM

## 2014-06-28 HISTORY — PX: COLONOSCOPY: SHX174

## 2014-06-30 LAB — CBC AND DIFFERENTIAL
HCT: 41 % (ref 36–46)
HEMOGLOBIN: 13.5 g/dL (ref 12.0–16.0)
Platelets: 497 10*3/uL — AB (ref 150–399)
WBC: 9.3 10^3/mL

## 2014-06-30 LAB — HEPATIC FUNCTION PANEL: ALK PHOS: 264 U/L — AB (ref 25–125)

## 2014-07-01 ENCOUNTER — Other Ambulatory Visit: Payer: Self-pay | Admitting: Gastroenterology

## 2014-07-01 DIAGNOSIS — R935 Abnormal findings on diagnostic imaging of other abdominal regions, including retroperitoneum: Secondary | ICD-10-CM

## 2014-07-02 ENCOUNTER — Ambulatory Visit
Admission: RE | Admit: 2014-07-02 | Discharge: 2014-07-02 | Disposition: A | Discharge status: Home / Self Care | Payer: Medicare Other | Source: Ambulatory Visit | Attending: Gastroenterology | Admitting: Gastroenterology

## 2014-07-02 DIAGNOSIS — R935 Abnormal findings on diagnostic imaging of other abdominal regions, including retroperitoneum: Secondary | ICD-10-CM

## 2014-07-02 MED ORDER — GADOXETATE DISODIUM 0.25 MMOL/ML IV SOLN
6.0000 mL | Freq: Once | INTRAVENOUS | Status: AC | PRN
  Administered 2014-07-02: 6 mL via INTRAVENOUS

## 2014-07-04 ENCOUNTER — Other Ambulatory Visit: Payer: Self-pay | Admitting: Gastroenterology

## 2014-07-04 DIAGNOSIS — K769 Liver disease, unspecified: Secondary | ICD-10-CM

## 2014-07-07 ENCOUNTER — Other Ambulatory Visit: Payer: Self-pay | Admitting: Radiology

## 2014-07-08 ENCOUNTER — Other Ambulatory Visit: Payer: Self-pay | Admitting: Gastroenterology

## 2014-07-09 ENCOUNTER — Ambulatory Visit
Admission: RE | Admit: 2014-07-09 | Discharge: 2014-07-09 | Disposition: A | Discharge status: Home / Self Care | Payer: Medicare Other | Source: Ambulatory Visit | Attending: Gastroenterology | Admitting: Gastroenterology

## 2014-07-09 ENCOUNTER — Other Ambulatory Visit: Payer: Self-pay | Admitting: Family Medicine

## 2014-07-09 ENCOUNTER — Inpatient Hospital Stay: Admission: RE | Admit: 2014-07-09 | Payer: Medicare Other | Source: Ambulatory Visit

## 2014-07-09 DIAGNOSIS — K769 Liver disease, unspecified: Secondary | ICD-10-CM

## 2014-07-09 MED ORDER — IOHEXOL 350 MG/ML SOLN
125.0000 mL | Freq: Once | INTRAVENOUS | Status: AC | PRN
  Administered 2014-07-09: 125 mL via INTRAVENOUS

## 2014-07-10 ENCOUNTER — Emergency Department (HOSPITAL_COMMUNITY): Payer: Medicare Other

## 2014-07-10 ENCOUNTER — Inpatient Hospital Stay (HOSPITAL_COMMUNITY)
Admission: EM | Admit: 2014-07-10 | Discharge: 2014-07-15 | DRG: 393 | Disposition: A | Discharge status: Home / Self Care | Payer: Medicare Other | Attending: Internal Medicine | Admitting: Internal Medicine

## 2014-07-10 ENCOUNTER — Encounter (HOSPITAL_COMMUNITY): Payer: Self-pay

## 2014-07-10 ENCOUNTER — Ambulatory Visit (HOSPITAL_COMMUNITY)
Admission: RE | Admit: 2014-07-10 | Discharge: 2014-07-10 | Disposition: A | Discharge status: Home / Self Care | Payer: Medicare Other | Source: Ambulatory Visit | Attending: Gastroenterology | Admitting: Gastroenterology

## 2014-07-10 DIAGNOSIS — Z91041 Radiographic dye allergy status: Secondary | ICD-10-CM

## 2014-07-10 DIAGNOSIS — I5032 Chronic diastolic (congestive) heart failure: Secondary | ICD-10-CM

## 2014-07-10 DIAGNOSIS — Z87891 Personal history of nicotine dependence: Secondary | ICD-10-CM

## 2014-07-10 DIAGNOSIS — R945 Abnormal results of liver function studies: Principal | ICD-10-CM

## 2014-07-10 DIAGNOSIS — I10 Essential (primary) hypertension: Secondary | ICD-10-CM | POA: Diagnosis present

## 2014-07-10 DIAGNOSIS — K559 Vascular disorder of intestine, unspecified: Principal | ICD-10-CM | POA: Diagnosis present

## 2014-07-10 DIAGNOSIS — K529 Noninfective gastroenteritis and colitis, unspecified: Secondary | ICD-10-CM | POA: Diagnosis present

## 2014-07-10 DIAGNOSIS — R0902 Hypoxemia: Secondary | ICD-10-CM | POA: Diagnosis present

## 2014-07-10 DIAGNOSIS — R7989 Other specified abnormal findings of blood chemistry: Secondary | ICD-10-CM | POA: Insufficient documentation

## 2014-07-10 DIAGNOSIS — Z8249 Family history of ischemic heart disease and other diseases of the circulatory system: Secondary | ICD-10-CM

## 2014-07-10 DIAGNOSIS — I509 Heart failure, unspecified: Secondary | ICD-10-CM | POA: Diagnosis present

## 2014-07-10 DIAGNOSIS — M810 Age-related osteoporosis without current pathological fracture: Secondary | ICD-10-CM | POA: Diagnosis present

## 2014-07-10 DIAGNOSIS — R1013 Epigastric pain: Secondary | ICD-10-CM | POA: Diagnosis not present

## 2014-07-10 DIAGNOSIS — I5033 Acute on chronic diastolic (congestive) heart failure: Secondary | ICD-10-CM | POA: Diagnosis present

## 2014-07-10 DIAGNOSIS — R9389 Abnormal findings on diagnostic imaging of other specified body structures: Secondary | ICD-10-CM

## 2014-07-10 DIAGNOSIS — Z823 Family history of stroke: Secondary | ICD-10-CM

## 2014-07-10 DIAGNOSIS — R413 Other amnesia: Secondary | ICD-10-CM

## 2014-07-10 DIAGNOSIS — I5031 Acute diastolic (congestive) heart failure: Secondary | ICD-10-CM | POA: Diagnosis present

## 2014-07-10 DIAGNOSIS — R42 Dizziness and giddiness: Secondary | ICD-10-CM

## 2014-07-10 DIAGNOSIS — D649 Anemia, unspecified: Secondary | ICD-10-CM | POA: Diagnosis present

## 2014-07-10 DIAGNOSIS — R160 Hepatomegaly, not elsewhere classified: Secondary | ICD-10-CM | POA: Diagnosis present

## 2014-07-10 LAB — APTT: aPTT: 30 seconds (ref 24–37)

## 2014-07-10 LAB — COMPREHENSIVE METABOLIC PANEL
ALBUMIN: 3 g/dL — AB (ref 3.5–5.2)
ALT: 41 U/L — ABNORMAL HIGH (ref 0–35)
AST: 72 U/L — AB (ref 0–37)
Alkaline Phosphatase: 232 U/L — ABNORMAL HIGH (ref 39–117)
Anion gap: 16 — ABNORMAL HIGH (ref 5–15)
BUN: 18 mg/dL (ref 6–23)
CALCIUM: 8.5 mg/dL (ref 8.4–10.5)
CO2: 23 meq/L (ref 19–32)
Chloride: 101 mEq/L (ref 96–112)
Creatinine, Ser: 0.83 mg/dL (ref 0.50–1.10)
GFR calc Af Amer: 82 mL/min — ABNORMAL LOW (ref >90)
GFR, EST NON AFRICAN AMERICAN: 71 mL/min — AB (ref >90)
Glucose, Bld: 158 mg/dL — ABNORMAL HIGH (ref 70–99)
Potassium: 3.6 mEq/L — ABNORMAL LOW (ref 3.7–5.3)
Sodium: 140 mEq/L (ref 137–147)
Total Bilirubin: 1.2 mg/dL (ref 0.3–1.2)
Total Protein: 6.9 g/dL (ref 6.0–8.3)

## 2014-07-10 LAB — CBC WITH DIFFERENTIAL/PLATELET
BASOS ABS: 0.1 10*3/uL (ref 0.0–0.1)
BASOS PCT: 0 % (ref 0–1)
EOS ABS: 0 10*3/uL (ref 0.0–0.7)
EOS PCT: 0 % (ref 0–5)
HEMATOCRIT: 36.1 % (ref 36.0–46.0)
Hemoglobin: 11.7 g/dL — ABNORMAL LOW (ref 12.0–15.0)
Lymphocytes Relative: 15 % (ref 12–46)
Lymphs Abs: 1.9 10*3/uL (ref 0.7–4.0)
MCH: 31.4 pg (ref 26.0–34.0)
MCHC: 32.4 g/dL (ref 30.0–36.0)
MCV: 96.8 fL (ref 78.0–100.0)
MONO ABS: 1.5 10*3/uL — AB (ref 0.1–1.0)
Monocytes Relative: 12 % (ref 3–12)
Neutro Abs: 8.9 10*3/uL — ABNORMAL HIGH (ref 1.7–7.7)
Neutrophils Relative %: 73 % (ref 43–77)
Platelets: 322 10*3/uL (ref 150–400)
RBC: 3.73 MIL/uL — ABNORMAL LOW (ref 3.87–5.11)
RDW: 14 % (ref 11.5–15.5)
WBC: 12.4 10*3/uL — AB (ref 4.0–10.5)

## 2014-07-10 LAB — CBC
HEMATOCRIT: 35.6 % — AB (ref 36.0–46.0)
HEMOGLOBIN: 11.7 g/dL — AB (ref 12.0–15.0)
MCH: 31.5 pg (ref 26.0–34.0)
MCHC: 32.9 g/dL (ref 30.0–36.0)
MCV: 96 fL (ref 78.0–100.0)
Platelets: 327 10*3/uL (ref 150–400)
RBC: 3.71 MIL/uL — ABNORMAL LOW (ref 3.87–5.11)
RDW: 13.8 % (ref 11.5–15.5)
WBC: 12.9 10*3/uL — ABNORMAL HIGH (ref 4.0–10.5)

## 2014-07-10 LAB — I-STAT CG4 LACTIC ACID, ED: Lactic Acid, Venous: 1.92 mmol/L (ref 0.5–2.2)

## 2014-07-10 LAB — PROTIME-INR
INR: 1.1 (ref 0.00–1.49)
Prothrombin Time: 14.2 seconds (ref 11.6–15.2)

## 2014-07-10 LAB — LIPASE, BLOOD: Lipase: 69 U/L — ABNORMAL HIGH (ref 11–59)

## 2014-07-10 MED ORDER — ONDANSETRON 4 MG PO TBDP
8.0000 mg | ORAL_TABLET | Freq: Once | ORAL | Status: AC
  Administered 2014-07-10: 8 mg via ORAL
  Filled 2014-07-10: qty 2

## 2014-07-10 MED ORDER — GELATIN ABSORBABLE 12-7 MM EX MISC
CUTANEOUS | Status: AC
  Filled 2014-07-10: qty 1

## 2014-07-10 MED ORDER — HYDROMORPHONE HCL PF 1 MG/ML IJ SOLN
1.0000 mg | Freq: Once | INTRAMUSCULAR | Status: AC
  Administered 2014-07-10: 1 mg via INTRAVENOUS
  Filled 2014-07-10: qty 1

## 2014-07-10 MED ORDER — MIDAZOLAM HCL 2 MG/2ML IJ SOLN
INTRAMUSCULAR | Status: AC | PRN
  Administered 2014-07-10 (×2): 1 mg via INTRAVENOUS

## 2014-07-10 MED ORDER — MIDAZOLAM HCL 2 MG/2ML IJ SOLN
INTRAMUSCULAR | Status: AC
  Filled 2014-07-10: qty 2

## 2014-07-10 MED ORDER — LIDOCAINE HCL (PF) 1 % IJ SOLN
INTRAMUSCULAR | Status: AC
  Filled 2014-07-10: qty 10

## 2014-07-10 MED ORDER — FENTANYL CITRATE 0.05 MG/ML IJ SOLN
INTRAMUSCULAR | Status: AC
  Filled 2014-07-10: qty 2

## 2014-07-10 MED ORDER — FENTANYL CITRATE 0.05 MG/ML IJ SOLN
INTRAMUSCULAR | Status: AC | PRN
  Administered 2014-07-10 (×2): 50 ug via INTRAVENOUS

## 2014-07-10 MED ORDER — ONDANSETRON HCL 4 MG/2ML IJ SOLN
4.0000 mg | Freq: Once | INTRAMUSCULAR | Status: AC
  Administered 2014-07-10: 4 mg via INTRAVENOUS
  Filled 2014-07-10: qty 2

## 2014-07-10 MED ORDER — SODIUM CHLORIDE 0.9 % IV SOLN: Freq: Once | INTRAVENOUS | Status: DC

## 2014-07-10 NOTE — Discharge Instructions (Signed)
Liver Biopsy, Care After °These instructions give you information on caring for yourself after your procedure. Your doctor may also give you more specific instructions. Call your doctor if you have any problems or questions after your procedure. °HOME CARE °· Rest at home for 1-2 days or as told by your doctor. °· Have someone stay with you for at least 24 hours. °· Do not do these things in the first 24 hours: °¨ Drive. °¨ Use machinery. °¨ Take care of other people. °¨ Sign legal documents. °¨ Take a bath or shower. °· There are many different ways to close and cover a cut (incision). For example, a cut can be closed with stitches, skin glue, or adhesive strips. Follow your doctor's instructions on: °¨ Taking care of your cut. °¨ Changing and removing your bandage (dressing). °¨ Removing whatever was used to close your cut. °· Do not drink alcohol in the first week. °· Do not lift more than 5 pounds or play contact sports for the first 2 weeks. °· Take medicines only as told by your doctor. For 1 week, do not take medicine that has aspirin in it or medicines like ibuprofen. °· Get your test results. °GET HELP IF: °· A cut bleeds and leaves more than just a small spot of blood. °· A cut is red, puffs up (swells), or hurts more than before. °· Fluid or something else comes from a cut. °· A cut smells bad. °· You have a fever or chills. °GET HELP RIGHT AWAY IF: °· You have swelling, bloating, or pain in your belly (abdomen). °· You get dizzy or faint. °· You have a rash. °· You feel sick to your stomach (nauseous) or throw up (vomit). °· You have trouble breathing, feel short of breath, or feel faint. °· Your chest hurts. °· You have problems talking or seeing. °· You have trouble balancing or moving your arms or legs. °Document Released: 08/23/2008 Document Revised: 03/31/2014 Document Reviewed: 01/10/2014 °[employer]® Patient Information ©2015 [employer]. This information is not intended to replace advice given to  you by your health care provider. Make sure you discuss any questions you have with your health care provider. ° °

## 2014-07-10 NOTE — Procedures (Signed)
L lobe liver lesion Bx 18 g core times 3 no comp

## 2014-07-10 NOTE — H&P (Signed)
Brianna Duncan is an 68 y.o. female.   Chief Complaint: Pt was seen in ED 06/17/14 Sxs of rectal blood; N/V (now resolved) Abnormal labs included high wbc and elevated liver function tests Abnormal CT revealed dominant L hepatic lobe/lesion and colonic wall thickening Pt was referred to GI MD for colonoscopy--which was performed by Dr Earlean Shawl. Was determined ischemic colitis per Dr Earlean Shawl I discussed with him via phone Work up has included repeat CT and MRI Consistent findings of abnormal L lobe liver lesion - possible malignancy vs cirrhosis Pt now scheduled for liver biopsy of lesion per Dr Earlean Shawl  HPI: HTN; elev LFTs; osteoporosis  Past Medical History  Diagnosis Date  . Hypertension   . Allergy   . Anxiety   . Osteoporosis     Past Surgical History  Procedure Laterality Date  . Abdominal hysterectomy    . Foot surgery      right foot, left great toe and left second toe surgery    Family History  Problem Relation Age of Onset  . Heart disease Mother   . Stroke Father   . Stroke Sister   . Heart disease Brother    Social History:  reports that she has quit smoking. She has never used smokeless tobacco. She reports that she drinks alcohol. She reports that she does not use illicit drugs.  Allergies:  Allergies  Allergen Reactions  . Ivp Dye [Iodinated Diagnostic Agents] Hives     (Not in a hospital admission)  Results for orders placed during the hospital encounter of 07/10/14 (from the past 48 hour(s))  APTT     Status: None   Collection Time    07/10/14  8:25 AM      Result Value Ref Range   aPTT 30  24 - 37 seconds  CBC     Status: Abnormal   Collection Time    07/10/14  8:25 AM      Result Value Ref Range   WBC 12.9 (*) 4.0 - 10.5 K/uL   RBC 3.71 (*) 3.87 - 5.11 MIL/uL   Hemoglobin 11.7 (*) 12.0 - 15.0 g/dL   HCT 35.6 (*) 36.0 - 46.0 %   MCV 96.0  78.0 - 100.0 fL   MCH 31.5  26.0 - 34.0 pg   MCHC 32.9  30.0 - 36.0 g/dL   RDW 13.8  11.5 - 15.5  %   Platelets 327  150 - 400 K/uL  PROTIME-INR     Status: None   Collection Time    07/10/14  8:25 AM      Result Value Ref Range   Prothrombin Time 14.2  11.6 - 15.2 seconds   INR 1.10  0.00 - 1.49   Ct Angio Abd/pel W/ And/or W/o  07/09/2014   ADDENDUM REPORT: 07/09/2014 18:38  ADDENDUM: Not mentioned in the impressions is common duct dilatation, without cause identified. There is a periampullary duodenal diverticulum which could be partially positive. Correlation with bilirubin levels and consideration of ERCP suggested.   Electronically Signed   By: Abigail Miyamoto M.D.   On: 07/09/2014 18:38   07/09/2014   CLINICAL DATA:  Concern for bowel ischemia after abnormal colonoscopy. Decreased appetite. Followup of liver lesions.  EXAM: CTA ABDOMEN AND PELVIS wITHOUT AND WITH CONTRAST  TECHNIQUE: Multidetector CT imaging of the abdomen and pelvis was performed using the standard protocol during bolus administration of intravenous contrast. Multiplanar reconstructed images and MIPs were obtained and reviewed to evaluate the vascular anatomy.  CONTRAST:  110mL OMNIPAQUE IOHEXOL 350 MG/ML SOLN  COMPARISON:  MRI of 07/02/2014.  CT of 06/17/2014 and 05/11/2011.  FINDINGS: Lower Chest: Clear lung bases. Normal heart size without pericardial or pleural effusion. Multivessel coronary artery atherosclerosis.  Abdomen/Pelvis: Hepatic steatosis, moderate. Hepatic dome vague hyper enhancement on image 172/series 4 is similar to on the prior MRI.  Fat sparing with superimposed Portal venous phase ill-defined hypo enhancement traversing the medial and lateral segment left liver lobes. Measures 4.5 x 4.7 cm on image 181/ series 3. Concurrent atrophy and architectural distortion within the surrounding left lobe. The intrahepatic biliary ductal dilatation detailed on the prior MRI is not readily apparent. Somewhat more focal focus of hypoattenuation in the left lateral segment measures 11 mm on image 185/series 4 and  corresponds to the abnormality on prior MRI.  Patent hepatic veins.  The portal veins are patent centrally.  Normal spleen, stomach.  Periampullary duodenal diverticulum. Normal gallbladder. Cholelithiasis on prior MRI not well visualized CT.  Common duct is dilated for age, 11 mm on coronal image 58. No pancreatic ductal dilatation or evidence of choledocholithiasis.  Normal adrenal glands.  Bilateral renal cysts.  The celiac axis and superior mesenteric artery are both widely patent. Mildly age advanced aortic and branch vessel atherosclerosis. No significant stenosis at the origin of the renal arteries. The inferior mesenteric artery is patent.  No retroperitoneal or retrocrural adenopathy.  Underdistended sigmoid colon on image 224. Colonic stool burden suggests constipation. The previously described areas of colonic wall thickening are not identified. Normal terminal ileum and appendix.  No small bowel wall thickening to suggest enteritis. No bowel obstruction, ascites, or pneumatosis.  Moderate atherosclerosis involving the pelvic vasculature, without significant stenosis or aneurysm. Both internal iliac arteries are patent.  No pelvic adenopathy. Contrast within the urinary bladder. Hysterectomy. No adnexal mass or significant free fluid.  Bones/Musculoskeletal: No acute osseous abnormality. 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  Review of the MIP images confirms the above findings.  IMPRESSION: 1. Aortic and branch vessel atherosclerosis, without significant stenosis or evidence of bowel ischemia. 2. Dominant left hepatic lobe area of abnormal enhancement with concurrent atrophy and anatomic distortion. Similar in appearance to the recent MRI. Considerations include cholangiocarcinoma or, if the patient has otherwise occult cirrhosis, an area of focal confluent hepatic fibrosis. Tissue sampling should be considered. A hepatic dome focus of vague hyper  enhancement is indeterminate and can be re-evaluated on follow-up. 3. The areas of colonic wall thickening have resolved and were likely due to colitis. Possible constipation.  Electronically Signed: By: Abigail Miyamoto M.D. On: 07/09/2014 17:29    Review of Systems  Constitutional: Positive for weight loss. Negative for fever and chills.  Respiratory: Negative for shortness of breath.   Cardiovascular: Negative for chest pain.  Gastrointestinal: Positive for abdominal pain and blood in stool.  Neurological: Positive for dizziness and weakness.       Occasionally dizzy  Psychiatric/Behavioral: Negative for substance abuse.    Blood pressure 113/49, pulse 106, temperature 98.2 F (36.8 C), temperature source Oral, resp. rate 18, height 4\' 11"  (1.499 m), weight 65.318 kg (144 lb), SpO2 96.00%. Physical Exam  Constitutional: She is oriented to person, place, and time. She appears well-developed and well-nourished.  Cardiovascular: Normal rate and regular rhythm.   No murmur heard. Respiratory: Effort normal and breath sounds normal. She has no wheezes.  GI: Soft. Bowel sounds are normal. There is no tenderness.  Musculoskeletal: Normal range  of motion.  Neurological: She is alert and oriented to person, place, and time.  Skin: Skin is warm and dry.  Psychiatric: She has a normal mood and affect. Her behavior is normal. Judgment and thought content normal.     Assessment/Plan Elevated LFTs/Rectal bleeding- resolved Abnormal liver on CT and MRI and abnormal colonoscopy- found to be ischemic colitis per Dr Earlean Shawl Worrisome for poss malignancy Scheduled now for liver lesion bx Pt and dtr aware of procedure benefits and risks and agreeable to proceed Consent signed and in chart  [place] A 07/10/2014, 10:03 AM

## 2014-07-10 NOTE — ED Notes (Signed)
MD at bedside. 

## 2014-07-10 NOTE — ED Notes (Addendum)
Pt had liver biopsy today for possible cancer on liver. PT is having severe upper abdominal pain now; diaphoretic. Pt states she had colonoscopy and had restriction from blood flow to liver. Pt is poor historian. But states sudden pain in upper abdomen and nausea. Denies vomiting.

## 2014-07-11 ENCOUNTER — Encounter (HOSPITAL_COMMUNITY): Payer: Self-pay | Admitting: *Deleted

## 2014-07-11 ENCOUNTER — Observation Stay (HOSPITAL_COMMUNITY): Payer: Medicare Other

## 2014-07-11 ENCOUNTER — Emergency Department (HOSPITAL_COMMUNITY): Payer: Medicare Other

## 2014-07-11 DIAGNOSIS — K5289 Other specified noninfective gastroenteritis and colitis: Secondary | ICD-10-CM

## 2014-07-11 DIAGNOSIS — D649 Anemia, unspecified: Secondary | ICD-10-CM | POA: Diagnosis present

## 2014-07-11 DIAGNOSIS — Z8249 Family history of ischemic heart disease and other diseases of the circulatory system: Secondary | ICD-10-CM | POA: Diagnosis not present

## 2014-07-11 DIAGNOSIS — M810 Age-related osteoporosis without current pathological fracture: Secondary | ICD-10-CM | POA: Diagnosis present

## 2014-07-11 DIAGNOSIS — K559 Vascular disorder of intestine, unspecified: Secondary | ICD-10-CM | POA: Diagnosis present

## 2014-07-11 DIAGNOSIS — I5033 Acute on chronic diastolic (congestive) heart failure: Secondary | ICD-10-CM | POA: Diagnosis present

## 2014-07-11 DIAGNOSIS — Z87891 Personal history of nicotine dependence: Secondary | ICD-10-CM | POA: Diagnosis not present

## 2014-07-11 DIAGNOSIS — Z823 Family history of stroke: Secondary | ICD-10-CM | POA: Diagnosis not present

## 2014-07-11 DIAGNOSIS — R0902 Hypoxemia: Secondary | ICD-10-CM

## 2014-07-11 DIAGNOSIS — I10 Essential (primary) hypertension: Secondary | ICD-10-CM

## 2014-07-11 DIAGNOSIS — R1013 Epigastric pain: Secondary | ICD-10-CM | POA: Diagnosis present

## 2014-07-11 DIAGNOSIS — K769 Liver disease, unspecified: Secondary | ICD-10-CM

## 2014-07-11 DIAGNOSIS — K529 Noninfective gastroenteritis and colitis, unspecified: Secondary | ICD-10-CM | POA: Diagnosis present

## 2014-07-11 DIAGNOSIS — I509 Heart failure, unspecified: Secondary | ICD-10-CM | POA: Diagnosis present

## 2014-07-11 DIAGNOSIS — R160 Hepatomegaly, not elsewhere classified: Secondary | ICD-10-CM | POA: Diagnosis present

## 2014-07-11 DIAGNOSIS — Z91041 Radiographic dye allergy status: Secondary | ICD-10-CM | POA: Diagnosis not present

## 2014-07-11 LAB — CBC WITH DIFFERENTIAL/PLATELET
BASOS PCT: 0 % (ref 0–1)
Basophils Absolute: 0 10*3/uL (ref 0.0–0.1)
Eosinophils Absolute: 0 10*3/uL (ref 0.0–0.7)
Eosinophils Relative: 0 % (ref 0–5)
HCT: 34.2 % — ABNORMAL LOW (ref 36.0–46.0)
Hemoglobin: 11 g/dL — ABNORMAL LOW (ref 12.0–15.0)
Lymphocytes Relative: 7 % — ABNORMAL LOW (ref 12–46)
Lymphs Abs: 0.6 10*3/uL — ABNORMAL LOW (ref 0.7–4.0)
MCH: 31.4 pg (ref 26.0–34.0)
MCHC: 32.2 g/dL (ref 30.0–36.0)
MCV: 97.7 fL (ref 78.0–100.0)
MONO ABS: 0.4 10*3/uL (ref 0.1–1.0)
MONOS PCT: 5 % (ref 3–12)
Neutro Abs: 7 10*3/uL (ref 1.7–7.7)
Neutrophils Relative %: 88 % — ABNORMAL HIGH (ref 43–77)
Platelets: 275 10*3/uL (ref 150–400)
RBC: 3.5 MIL/uL — ABNORMAL LOW (ref 3.87–5.11)
RDW: 14.2 % (ref 11.5–15.5)
WBC: 8 10*3/uL (ref 4.0–10.5)

## 2014-07-11 LAB — POCT I-STAT 3, ART BLOOD GAS (G3+)
Acid-base deficit: 1 mmol/L (ref 0.0–2.0)
Bicarbonate: 23.3 mEq/L (ref 20.0–24.0)
O2 Saturation: 86 %
PO2 ART: 52 mmHg — AB (ref 80.0–100.0)
TCO2: 24 mmol/L (ref 0–100)
pCO2 arterial: 38.1 mmHg (ref 35.0–45.0)
pH, Arterial: 7.394 (ref 7.350–7.450)

## 2014-07-11 LAB — HEPATITIS PANEL, ACUTE
HCV Ab: NEGATIVE
HEP A IGM: NONREACTIVE
Hep B C IgM: NONREACTIVE
Hepatitis B Surface Ag: NEGATIVE

## 2014-07-11 LAB — PRO B NATRIURETIC PEPTIDE: Pro B Natriuretic peptide (BNP): 30.2 pg/mL (ref 0–125)

## 2014-07-11 LAB — COMPREHENSIVE METABOLIC PANEL
ALBUMIN: 2.7 g/dL — AB (ref 3.5–5.2)
ALK PHOS: 289 U/L — AB (ref 39–117)
ALT: 61 U/L — AB (ref 0–35)
AST: 109 U/L — ABNORMAL HIGH (ref 0–37)
Anion gap: 12 (ref 5–15)
BUN: 13 mg/dL (ref 6–23)
CO2: 24 mEq/L (ref 19–32)
Calcium: 8.5 mg/dL (ref 8.4–10.5)
Chloride: 99 mEq/L (ref 96–112)
Creatinine, Ser: 0.67 mg/dL (ref 0.50–1.10)
GFR calc Af Amer: >90 mL/min (ref >90)
GFR calc non Af Amer: 88 mL/min — ABNORMAL LOW (ref >90)
GLUCOSE: 216 mg/dL — AB (ref 70–99)
POTASSIUM: 4.3 meq/L (ref 3.7–5.3)
SODIUM: 135 meq/L — AB (ref 137–147)
Total Bilirubin: 1.3 mg/dL — ABNORMAL HIGH (ref 0.3–1.2)
Total Protein: 6.3 g/dL (ref 6.0–8.3)

## 2014-07-11 LAB — TSH: TSH: 1.27 u[IU]/mL (ref 0.350–4.500)

## 2014-07-11 LAB — LACTIC ACID, PLASMA: Lactic Acid, Venous: 3.1 mmol/L — ABNORMAL HIGH (ref 0.5–2.2)

## 2014-07-11 MED ORDER — IOHEXOL 350 MG/ML SOLN
80.0000 mL | Freq: Once | INTRAVENOUS | Status: AC | PRN
  Administered 2014-07-11: 80 mL via INTRAVENOUS

## 2014-07-11 MED ORDER — FLUTICASONE PROPIONATE 50 MCG/ACT NA SUSP
1.0000 | Freq: Every day | NASAL | Status: DC | PRN
  Filled 2014-07-11: qty 16

## 2014-07-11 MED ORDER — ONDANSETRON HCL 4 MG/2ML IJ SOLN: 4.0000 mg | Freq: Four times a day (QID) | INTRAMUSCULAR | Status: DC | PRN

## 2014-07-11 MED ORDER — MORPHINE SULFATE 2 MG/ML IJ SOLN
1.0000 mg | INTRAMUSCULAR | Status: DC | PRN
  Administered 2014-07-13 – 2014-07-14 (×7): 1 mg via INTRAVENOUS
  Filled 2014-07-11 (×6): qty 1

## 2014-07-11 MED ORDER — METRONIDAZOLE 500 MG PO TABS: 500.0000 mg | ORAL_TABLET | Freq: Two times a day (BID) | ORAL | Status: DC

## 2014-07-11 MED ORDER — LISINOPRIL 20 MG PO TABS
20.0000 mg | ORAL_TABLET | Freq: Every day | ORAL | Status: DC
  Administered 2014-07-11 – 2014-07-15 (×5): 20 mg via ORAL
  Filled 2014-07-11 (×5): qty 1

## 2014-07-11 MED ORDER — CIPROFLOXACIN HCL 500 MG PO TABS
500.0000 mg | ORAL_TABLET | Freq: Once | ORAL | Status: AC
  Administered 2014-07-11: 500 mg via ORAL
  Filled 2014-07-11: qty 1

## 2014-07-11 MED ORDER — ONDANSETRON 4 MG PO TBDP
4.0000 mg | ORAL_TABLET | Freq: Once | ORAL | Status: AC
  Administered 2014-07-11: 4 mg via ORAL
  Filled 2014-07-11: qty 1

## 2014-07-11 MED ORDER — ACETAMINOPHEN 650 MG RE SUPP: 650.0000 mg | Freq: Four times a day (QID) | RECTAL | Status: DC | PRN

## 2014-07-11 MED ORDER — ESTRADIOL 0.05 MG/24HR TD PTWK
0.0500 mg | MEDICATED_PATCH | TRANSDERMAL | Status: DC
  Administered 2014-07-11: 0.05 mg via TRANSDERMAL
  Filled 2014-07-11: qty 1

## 2014-07-11 MED ORDER — ENOXAPARIN SODIUM 40 MG/0.4ML ~~LOC~~ SOLN
40.0000 mg | SUBCUTANEOUS | Status: DC
  Administered 2014-07-11 – 2014-07-15 (×5): 40 mg via SUBCUTANEOUS
  Filled 2014-07-11 (×5): qty 0.4

## 2014-07-11 MED ORDER — ESTRADIOL 0.05 MG/24HR TD PTTW: 0.0500 mg | MEDICATED_PATCH | TRANSDERMAL | Status: DC

## 2014-07-11 MED ORDER — SODIUM CHLORIDE 0.9 % IV BOLUS (SEPSIS)
500.0000 mL | Freq: Once | INTRAVENOUS | Status: AC
Start: 2014-07-11 — End: 2014-07-11
  Administered 2014-07-11: 500 mL via INTRAVENOUS

## 2014-07-11 MED ORDER — CIPROFLOXACIN HCL 500 MG PO TABS: 500.0000 mg | ORAL_TABLET | Freq: Two times a day (BID) | ORAL | Status: DC

## 2014-07-11 MED ORDER — ONDANSETRON 4 MG PO TBDP: ORAL_TABLET | ORAL | Status: DC

## 2014-07-11 MED ORDER — METRONIDAZOLE IN NACL 5-0.79 MG/ML-% IV SOLN
500.0000 mg | Freq: Three times a day (TID) | INTRAVENOUS | Status: DC
  Administered 2014-07-11 – 2014-07-12 (×4): 500 mg via INTRAVENOUS
  Filled 2014-07-11 (×6): qty 100

## 2014-07-11 MED ORDER — SODIUM CHLORIDE 0.9 % IV SOLN
INTRAVENOUS | Status: DC
  Administered 2014-07-11: 06:00:00 via INTRAVENOUS

## 2014-07-11 MED ORDER — AMLODIPINE BESYLATE 10 MG PO TABS
10.0000 mg | ORAL_TABLET | Freq: Every day | ORAL | Status: DC
  Administered 2014-07-11 – 2014-07-15 (×5): 10 mg via ORAL
  Filled 2014-07-11 (×5): qty 1

## 2014-07-11 MED ORDER — ALPRAZOLAM 0.5 MG PO TABS
0.5000 mg | ORAL_TABLET | Freq: Every evening | ORAL | Status: DC | PRN
  Administered 2014-07-12 – 2014-07-15 (×4): 0.5 mg via ORAL
  Filled 2014-07-11 (×4): qty 1

## 2014-07-11 MED ORDER — ACETAMINOPHEN 325 MG PO TABS
650.0000 mg | ORAL_TABLET | Freq: Four times a day (QID) | ORAL | Status: DC | PRN
  Administered 2014-07-13: 650 mg via ORAL
  Filled 2014-07-11: qty 2

## 2014-07-11 MED ORDER — DIPHENHYDRAMINE HCL 50 MG PO CAPS
50.0000 mg | ORAL_CAPSULE | Freq: Once | ORAL | Status: AC
  Administered 2014-07-11: 50 mg via ORAL
  Filled 2014-07-11: qty 2
  Filled 2014-07-11: qty 1

## 2014-07-11 MED ORDER — ONDANSETRON HCL 4 MG PO TABS
4.0000 mg | ORAL_TABLET | Freq: Four times a day (QID) | ORAL | Status: DC | PRN
  Administered 2014-07-13 – 2014-07-15 (×6): 4 mg via ORAL
  Filled 2014-07-11 (×6): qty 1

## 2014-07-11 MED ORDER — OXYCODONE-ACETAMINOPHEN 5-325 MG PO TABS: 2.0000 | ORAL_TABLET | ORAL | Status: DC | PRN

## 2014-07-11 MED ORDER — CIPROFLOXACIN IN D5W 400 MG/200ML IV SOLN
400.0000 mg | Freq: Two times a day (BID) | INTRAVENOUS | Status: DC
Start: 2014-07-11 — End: 2014-07-12
  Administered 2014-07-11 – 2014-07-12 (×3): 400 mg via INTRAVENOUS
  Filled 2014-07-11 (×4): qty 200

## 2014-07-11 MED ORDER — METRONIDAZOLE 500 MG PO TABS
500.0000 mg | ORAL_TABLET | Freq: Once | ORAL | Status: AC
  Administered 2014-07-11: 500 mg via ORAL
  Filled 2014-07-11: qty 1

## 2014-07-11 MED ORDER — SODIUM CHLORIDE 0.9 % IJ SOLN
3.0000 mL | Freq: Two times a day (BID) | INTRAMUSCULAR | Status: DC
  Administered 2014-07-11 – 2014-07-15 (×7): 3 mL via INTRAVENOUS

## 2014-07-11 MED ORDER — OXYCODONE-ACETAMINOPHEN 5-325 MG PO TABS
2.0000 | ORAL_TABLET | Freq: Once | ORAL | Status: AC
  Administered 2014-07-11: 2 via ORAL
  Filled 2014-07-11: qty 2

## 2014-07-11 MED ORDER — HYDROCORTISONE NA SUCCINATE PF 100 MG IJ SOLR
200.0000 mg | Freq: Once | INTRAMUSCULAR | Status: AC
  Administered 2014-07-11: 200 mg via INTRAVENOUS
  Filled 2014-07-11: qty 4

## 2014-07-11 NOTE — ED Notes (Signed)
Pt still O2 sat gets lows 84-85% without oxygen, pt states she doesn't use O2 at home. Fluids given to pt with meds no c/o pain or nausea at this time.

## 2014-07-11 NOTE — Progress Notes (Signed)
UR Completed Cammie Faulstich Graves-Bigelow, RN,BSN [phone]  

## 2014-07-11 NOTE — ED Provider Notes (Signed)
Care received from Dr Tawnya Crook awaiting po challenge and pain control.  Pt with recent dx of liver mass, possible colon cancer with mets.  Had liver bx today, then developed severe abd pain.  CT scan without contrast (IVP contrast allergy, requires 13 hour steroid protocol) shows colitis.  Pt was feeling better, able to tolerate PO, but has been having hypoxia, no prior h/o same.  Pt denies dyspnea, chest pain.  ABG shows hypoxia.  Will contact hospitalist for admission, needs home health 02 set up and CT angio chest once steroid protocol is done.  Results for orders placed during the hospital encounter of 07/10/14  CBC WITH DIFFERENTIAL      Result Value Ref Range   WBC 12.4 (*) 4.0 - 10.5 K/uL   RBC 3.73 (*) 3.87 - 5.11 MIL/uL   Hemoglobin 11.7 (*) 12.0 - 15.0 g/dL   HCT 36.1  36.0 - 46.0 %   MCV 96.8  78.0 - 100.0 fL   MCH 31.4  26.0 - 34.0 pg   MCHC 32.4  30.0 - 36.0 g/dL   RDW 14.0  11.5 - 15.5 %   Platelets 322  150 - 400 K/uL   Neutrophils Relative % 73  43 - 77 %   Neutro Abs 8.9 (*) 1.7 - 7.7 K/uL   Lymphocytes Relative 15  12 - 46 %   Lymphs Abs 1.9  0.7 - 4.0 K/uL   Monocytes Relative 12  3 - 12 %   Monocytes Absolute 1.5 (*) 0.1 - 1.0 K/uL   Eosinophils Relative 0  0 - 5 %   Eosinophils Absolute 0.0  0.0 - 0.7 K/uL   Basophils Relative 0  0 - 1 %   Basophils Absolute 0.1  0.0 - 0.1 K/uL  COMPREHENSIVE METABOLIC PANEL      Result Value Ref Range   Sodium 140  137 - 147 mEq/L   Potassium 3.6 (*) 3.7 - 5.3 mEq/L   Chloride 101  96 - 112 mEq/L   CO2 23  19 - 32 mEq/L   Glucose, Bld 158 (*) 70 - 99 mg/dL   BUN 18  6 - 23 mg/dL   Creatinine, Ser 0.83  0.50 - 1.10 mg/dL   Calcium 8.5  8.4 - 10.5 mg/dL   Total Protein 6.9  6.0 - 8.3 g/dL   Albumin 3.0 (*) 3.5 - 5.2 g/dL   AST 72 (*) 0 - 37 U/L   ALT 41 (*) 0 - 35 U/L   Alkaline Phosphatase 232 (*) 39 - 117 U/L   Total Bilirubin 1.2  0.3 - 1.2 mg/dL   GFR calc non Af Amer 71 (*) >90 mL/min   GFR calc Af Amer 82 (*) >90  mL/min   Anion gap 16 (*) 5 - 15  LIPASE, BLOOD      Result Value Ref Range   Lipase 69 (*) 11 - 59 U/L  I-STAT CG4 LACTIC ACID, ED      Result Value Ref Range   Lactic Acid, Venous 1.92  0.5 - 2.2 mmol/L   Ct Abdomen Pelvis Wo Contrast  07/11/2014   CLINICAL DATA:  Upper abdominal pain. Status post liver biopsy today.  EXAM: CT ABDOMEN AND PELVIS WITHOUT CONTRAST  TECHNIQUE: Multidetector CT imaging of the abdomen and pelvis was performed following the standard protocol without IV contrast.  COMPARISON:  CT of the abdomen and pelvis 07/09/2014.  FINDINGS: Lung Bases: Mild dependent atelectasis in the lower lobes of the lungs bilaterally.  Atherosclerotic calcifications in the right coronary artery. Calcifications of the mitral annulus.  Abdomen/Pelvis: Focal contour abnormalities in the left lobe of the liver corresponding to suspected mass (see MRI of the liver 07/02/2014). Heterogeneous hypoattenuation throughout the hepatic parenchyma, compatible with heterogeneous hepatic steatosis. Some focal fatty sparing adjacent to the gallbladder fossa is noted. Small calcified gallstones are noted within the dependent portion of the gallbladder. No findings to suggest acute cholecystitis at this time. No intrahepatic or perihepatic high attenuation fluid collection to suggest post biopsy hemorrhage. The unenhanced appearance of the pancreas, spleen and bilateral adrenal glands is unremarkable. Multiple nonobstructive calculi are noted within the collecting systems of the kidneys bilaterally, largest of which measures 9 mm in the lower pole collecting system of the left kidney. No ureteral stones or findings of urinary tract obstruction are noted at this time.  There is some diffuse colonic wall thickening throughout the sigmoid colon with some subtle surrounding haziness in the mesocolon fat and hypervascularity, particularly in the proximal sigmoid colon, concerning for an acute colitis. Normal appendix. No  significant volume of ascites. No pneumoperitoneum. No pathologic distention of small bowel. No definite lymphadenopathy identified in the abdomen or pelvis on today's non contrast CT examination. Status post hysterectomy. Ovaries are not confidently identified may be surgically absent or atrophic. Urinary bladder is normal in appearance.  Musculoskeletal: There are no aggressive appearing lytic or blastic lesions noted in the visualized portions of the skeleton.  IMPRESSION: 1. No intrahepatic or perihepatic high attenuation fluid collection to suggest significant post biopsy hemorrhage. 2. Extensive colonic wall thickening with surrounding inflammatory changes, most notable in the proximal sigmoid colon, compatible with an acute colitis. 3. Cholelithiasis without evidence to suggest acute cholecystitis at this time. 4. Normal appendix. 5. Multiple nonobstructive calculi within the collecting systems of the kidneys bilaterally, largest of which measures 9 mm in the lower pole collecting system of the left kidney. No ureteral stones or findings of urinary tract obstruction are noted at this time. 6. Extensive atherosclerosis, including right coronary artery disease. Please note that although the presence of coronary artery calcium documents the presence of coronary artery disease, the severity of this disease and any potential stenosis cannot be assessed on this non-gated CT examination. Assessment for potential risk factor modification, dietary therapy or pharmacologic therapy may be warranted, if clinically indicated. 7. Abnormal appearance of the left lobe of the liver corresponding to suspected mass based on prior MRI 07/02/2014. Correlation with forthcoming results of today's liver biopsy is recommended. 8. Hepatic steatosis.   Electronically Signed   By: Vinnie Langton M.D.   On: 07/11/2014 00:32   Ct Abdomen Pelvis Wo Contrast  06/17/2014   CLINICAL DATA:  Vomiting with diarrhea and chills.  Bloody  stool.  EXAM: CT ABDOMEN AND PELVIS WITHOUT CONTRAST  TECHNIQUE: Multidetector CT imaging of the abdomen and pelvis was performed following the standard protocol without IV contrast.  COMPARISON:  05/11/2011 CT urogram.  FINDINGS: Lung bases: Clear. No pleural or pericardial effusion. Coronary artery calcifications and a small hiatal hernia are noted.  Liver: There is new diffuse hepatic steatosis. There is new volume loss within the left hepatic lobe. Within the contracted left hepatic lobe, there is central increased density. There is suspicion of underlying low-density lesions measuring 13 mm and 8 mm on image 24. In addition, there is a 1.2 cm low-density lesion posteriorly in the dome of the right hepatic lobe on image 20.  Gallbladder/Biliary system: There is dependent high-density  bile within the gallbladder lumen as well as a small calcified gallstone. There is no gallbladder wall thickening. There is mild extrahepatic biliary dilatation.  Pancreas: Unremarkable.  Spleen: Normal in size without focal abnormality.  Adrenal glands: No adrenal mass.  Kidneys/Ureters/Bladder: There are small nonobstructing renal calculi bilaterally, the largest in the lower pole of the left kidney. There is no evidence of hydronephrosis, ureteral or bladder calculus. There is no perinephric soft tissue stranding or other focal renal abnormality.  Bowel: The stomach and small bowel appear unremarkable. There is eccentric wall thickening within the cecum and right colon. There is also focal eccentric wall thickening in the mid transverse colon (axial image 50). Several other areas of nodular bowel wall thickening are suspected in the colon. The appendix appears normal. There is no evidence of bowel obstruction.  Peritoneum: No ascites or peritoneal nodularity.  Lymph Nodes: There are small ileocolonic mesenteric lymph nodes.  Vascular Structures: There is atherosclerosis of the aorta, its branches and the iliac arteries. There is  some distortion of the portal and hepatic veins without occlusion or definite intravascular tumor.  Reproductive Organs: The uterus is surgically absent. There is no adnexal mass.  Abdominal wall: There is a small umbilical hernia. There is also a small amount of fluid in the right groin medial to the femoral vessels, suspicious for fluid within a small hernia.  Musculoskeletal: Degenerative changes are present throughout the lower lumbar spine. There is a chronic appearing superior endplate Schmorl's node at T8. No worrisome osseous findings demonstrated.  IMPRESSION: 1. Interval change in the appearance of the liver with contraction of the left lobe and multiple low density lesions. Findings are worrisome for malignancy, possibly metastatic disease given the associated colonic findings. Alternatively, the findings could be due to multicentric hepatocellular carcinoma. 2. New multicentric irregular colonic wall thickening, primarily within the right and transverse colon. Appearance is not typical for multifocal colitis, and colon cancer is suspected. Endoscopic evaluation recommended. No evidence of bowel obstruction or perforation. 3. Nonobstructing bilateral renal calculi.   Electronically Signed   By: Camie Patience M.D.   On: 06/17/2014 21:11   Dg Chest 2 View  07/11/2014   CLINICAL DATA:  Hypoxemia.  History of smoking.  EXAM: CHEST  2 VIEW  COMPARISON:  Chest radiograph performed 04/11/2014  FINDINGS: The lungs are well-aerated and clear. There is no evidence of focal opacification, pleural effusion or pneumothorax.  The heart is borderline normal in size; the mediastinal contour is within normal limits. No acute osseous abnormalities are seen.  IMPRESSION: No acute cardiopulmonary process seen.   Electronically Signed   By: Garald Balding M.D.   On: 07/11/2014 03:33   Mr Abdomen W Wo Contrast  07/02/2014   CLINICAL DATA:  Liver mass on prior exam  EXAM: MRI ABDOMEN WITHOUT AND WITH CONTRAST  TECHNIQUE:  Multiplanar multisequence MR imaging of the abdomen was performed both before and after the administration of intravenous contrast.  CONTRAST:  6 cc Eovist IV contrast  COMPARISON:  CT abdomen/ pelvis 06/17/2014 at [facility]: There is asymmetric atrophy of the lateral segment left hepatic lobe with abnormal internal T2 weighted hyperintensity, surrounding mild intrahepatic ductal dilatation, and abnormal central enhancement spanning the medial and lateral segment of the left hepatic lobe in a confluent fashion, measuring 6.9 x 3.9 cm. Within this abnormal area of T2 weighted signal intensity and enhancement, there is a rim enhancing 1.4 cm lesion in the lateral segment left hepatic  lobe for example image 16/series 17. Gallstones, sub cm renal cortical cysts, and sub cm hepatic cysts are incidentally noted. Adrenal glands, spleen, and pancreas are unremarkable. Allowing for technique, the previously seen area of apparent right colonic wall thickening is not re-evaluated due to motion. No ascites or lymphadenopathy. Diffuse drop in hepatic signal is compatible with steatosis.  IMPRESSION: Findings highly suggestive for malignancy such as primary hepatic cholangiocarcinoma spanning the lateral and medial segments of the left hepatic lobe, although metastasis with desmoplastic reaction could appear similar.  Hepatic steatosis.  Hepatic and renal cortical cysts.  The colon is not specifically re- evaluated compared to the prior exam, due to inherent differences of MRI technique. Colonoscopy is again recommended based on previous exam findings.   Electronically Signed   By: Conchita Paris M.D.   On: 07/02/2014 16:05   US Biopsy  07/10/2014   CLINICAL DATA:  Left lobe liver lesion  EXAM: ULTRASOUND-GUIDED BIOPSY OF A LEFT LOBE LIVER LESION.  CORE.  MEDICATIONS AND MEDICAL HISTORY: Versed 2 mg, Fentanyl 100 mcg.  ANESTHESIA/SEDATION: Moderate sedation time: 5 minutes  PROCEDURE: The  procedure, risks, benefits, and alternatives were explained to the patient. Questions regarding the procedure were encouraged and answered. The patient understands and consents to the procedure.  The epigastrium was prepped with Betadine in a sterile fashion, and a sterile drape was applied covering the operative field. A sterile gown and sterile gloves were used for the procedure.  Under sonographic guidance, an 17gauge guide needle was advanced into the left lobe liver lesion. Subsequently 4 18 gauge core biopsies were obtained. Gel-Foam slurry was injected into the tract. Final imaging was performed.  Patient tolerated the procedure well without complication. Vital sign monitoring by nursing staff during the procedure will continue as patient is in the special procedures unit for post procedure observation.  FINDINGS: The images document guide needle placement within the left lobe liver lesion. Post biopsy images demonstrate no hemorrhage.  IMPRESSION: Successful ultrasound-guided core biopsy of a left lobe liver lesion.   Electronically Signed   By: Maryclare Bean M.D.   On: 07/10/2014 17:12   Ct Angio Abd/pel W/ And/or W/o  07/09/2014   ADDENDUM REPORT: 07/09/2014 18:38  ADDENDUM: Not mentioned in the impressions is common duct dilatation, without cause identified. There is a periampullary duodenal diverticulum which could be partially positive. Correlation with bilirubin levels and consideration of ERCP suggested.   Electronically Signed   By: Abigail Miyamoto M.D.   On: 07/09/2014 18:38   07/09/2014   CLINICAL DATA:  Concern for bowel ischemia after abnormal colonoscopy. Decreased appetite. Followup of liver lesions.  EXAM: CTA ABDOMEN AND PELVIS wITHOUT AND WITH CONTRAST  TECHNIQUE: Multidetector CT imaging of the abdomen and pelvis was performed using the standard protocol during bolus administration of intravenous contrast. Multiplanar reconstructed images and MIPs were obtained and reviewed to evaluate the  vascular anatomy.  CONTRAST:  150mL OMNIPAQUE IOHEXOL 350 MG/ML SOLN  COMPARISON:  MRI of 07/02/2014.  CT of 06/17/2014 and 05/11/2011.  FINDINGS: Lower Chest: Clear lung bases. Normal heart size without pericardial or pleural effusion. Multivessel coronary artery atherosclerosis.  Abdomen/Pelvis: Hepatic steatosis, moderate. Hepatic dome vague hyper enhancement on image 172/series 4 is similar to on the prior MRI.  Fat sparing with superimposed Portal venous phase ill-defined hypo enhancement traversing the medial and lateral segment left liver lobes. Measures 4.5 x 4.7 cm on image 181/ series 3. Concurrent atrophy and architectural distortion within the surrounding left lobe.  The intrahepatic biliary ductal dilatation detailed on the prior MRI is not readily apparent. Somewhat more focal focus of hypoattenuation in the left lateral segment measures 11 mm on image 185/series 4 and corresponds to the abnormality on prior MRI.  Patent hepatic veins.  The portal veins are patent centrally.  Normal spleen, stomach.  Periampullary duodenal diverticulum. Normal gallbladder. Cholelithiasis on prior MRI not well visualized CT.  Common duct is dilated for age, 11 mm on coronal image 58. No pancreatic ductal dilatation or evidence of choledocholithiasis.  Normal adrenal glands.  Bilateral renal cysts.  The celiac axis and superior mesenteric artery are both widely patent. Mildly age advanced aortic and branch vessel atherosclerosis. No significant stenosis at the origin of the renal arteries. The inferior mesenteric artery is patent.  No retroperitoneal or retrocrural adenopathy.  Underdistended sigmoid colon on image 224. Colonic stool burden suggests constipation. The previously described areas of colonic wall thickening are not identified. Normal terminal ileum and appendix.  No small bowel wall thickening to suggest enteritis. No bowel obstruction, ascites, or pneumatosis.  Moderate atherosclerosis involving the pelvic  vasculature, without significant stenosis or aneurysm. Both internal iliac arteries are patent.  No pelvic adenopathy. Contrast within the urinary bladder. Hysterectomy. No adnexal mass or significant free fluid.  Bones/Musculoskeletal: No acute osseous abnormality. 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  Review of the MIP images confirms the above findings.  IMPRESSION: 1. Aortic and branch vessel atherosclerosis, without significant stenosis or evidence of bowel ischemia. 2. Dominant left hepatic lobe area of abnormal enhancement with concurrent atrophy and anatomic distortion. Similar in appearance to the recent MRI. Considerations include cholangiocarcinoma or, if the patient has otherwise occult cirrhosis, an area of focal confluent hepatic fibrosis. Tissue sampling should be considered. A hepatic dome focus of vague hyper enhancement is indeterminate and can be re-evaluated on follow-up. 3. The areas of colonic wall thickening have resolved and were likely due to colitis. Possible constipation.  Electronically Signed: By: Abigail Miyamoto M.D. On: 07/09/2014 17:29      Kalman Drape, MD 07/11/14 [id number]

## 2014-07-11 NOTE — ED Notes (Signed)
Incentive spirometry given to the pt, pt oriented of how to use it and encouraged to do it 5 to 10 times frequently to keep O2 saturation above 92, pt is using her IS keeping O2 saturation on 96% on RA. We'll continue to monitor.

## 2014-07-11 NOTE — Progress Notes (Signed)
  Echocardiogram 2D Echocardiogram has been performed.  Brianna Duncan 07/11/2014, 11:13 AM

## 2014-07-11 NOTE — Telephone Encounter (Signed)
Faxed

## 2014-07-11 NOTE — Progress Notes (Signed)
Patient seen and examined today, agree with H&P  - her main concern was abdominal pain, antibiotics reasonable given CT findings.  - has known ischemic colitis per notes. Her gastroenterologist is Dr. Earlean Shawl, she just had a colonoscopy few weeks ago, IR discussed with GI few days ago - no clear evidence for her dyspnea, reports more DOE which per Dr. Carlota Raspberry (PCP) notes somewhat acute on chronic. No 2D echo in the system, obtain one today. Her cardiologist is Dr. Einar Gip. - Liver mass s/p biopsy per IR yesterday, awaiting biopsies. Elevated LFTs - Anemia, stable, recent GI bleed in the setting of ischemic colitis, Hb stable, recheck tomorrow  Costin M. Cruzita Lederer, MD [facility] [phone]

## 2014-07-11 NOTE — Discharge Instructions (Signed)

## 2014-07-11 NOTE — Consult Note (Signed)
PHARMACY NOTE  CONSULT :  Cipro INDICATION :  Intra-abdominal Infection  ASSESSMENT:  Pharmacy consulted for dosing Cipro.     Patient received oral doses of Cipro and Flagyl in ED.  Flagyl changed to IV for maintenance. .  Dosing Weight  67 kg,  SCr 0.83, estimated CrCl  54 ml/min  Will begin Cipro as IV with transition to oral antibiotics pending patient response.  PLAN:  1. Begin Cipro 400 mg IV q 12 hours.   When transitioning to oral Cipro, would dose Cipro 500 mg PO q 12 hours. 2. Recommend Monitoring renal function, Scr, UOP, WBC's, fever curve, any cultures/sensitivities, and clinical progression. 3. Pharmacy will sign off and follow peripherally given no adjustments in doses or schedules expected.   4. Pharmacy has alerts in place to alert for dramatic changes in renal function or clinical condition that might require dose or schedule adjustments. 5. Please re-consult if additional assistance is needed.  Thank you for allowing Pharmacy to participate in this patient's care   Estelle June,  Pharm.D. ,  07/11/2014,  8:54 AM

## 2014-07-11 NOTE — Progress Notes (Signed)
ABG drawn on room air, PO2 52 Spo2 86%. Pt placed on 5 L C[place], RN aware and given ABG results. Will continue to monitor.

## 2014-07-11 NOTE — ED Provider Notes (Signed)
CSN: [id number]     Arrival date & time 07/10/14  2011 History   First MD Initiated Contact with Patient 07/10/14 2123     Chief Complaint  Patient presents with  . Abdominal Pain     (Consider location/radiation/quality/duration/timing/severity/associated sxs/prior Treatment) Patient is a 68 y.o. female presenting with abdominal pain. The history is provided by the patient. No language interpreter was used.  Abdominal Pain Pain location:  Epigastric Pain quality: aching and sharp   Pain radiates to:  Does not radiate Pain severity:  Severe Onset quality:  Sudden Duration:  6 hours Timing:  Constant Progression:  Improving Chronicity:  New Context comment:  Had liver bx today Relieved by:  Vomiting Worsened by:  Nothing tried Ineffective treatments:  None tried Associated symptoms: nausea and vomiting   Associated symptoms: no chest pain, no chills, no constipation, no cough, no diarrhea, no dysuria, no fatigue, no fever, no shortness of breath and no sore throat   Vomiting:    Number of occurrences:  Once Risk factors comment:  Liver mass which was biopsied today   Past Medical History  Diagnosis Date  . Hypertension   . Allergy   . Anxiety   . Osteoporosis    Past Surgical History  Procedure Laterality Date  . Abdominal hysterectomy    . Foot surgery      right foot, left great toe and left second toe surgery   Family History  Problem Relation Age of Onset  . Heart disease Mother   . Stroke Father   . Stroke Sister   . Heart disease Brother    History  Substance Use Topics  . Smoking status: Former Research scientist (life sciences)  . Smokeless tobacco: Never Used     Comment: quit 15  yrs ago  . Alcohol Use: Yes     Comment: occassionally   OB History   Grav Para Term Preterm Abortions TAB SAB Ect Mult Living                 Review of Systems  Constitutional: Negative for fever, chills, diaphoresis, activity change, appetite change and fatigue.  HENT: Negative for  congestion, facial swelling, rhinorrhea and sore throat.   Eyes: Negative for photophobia and discharge.  Respiratory: Negative for cough, chest tightness and shortness of breath.   Cardiovascular: Negative for chest pain, palpitations and leg swelling.  Gastrointestinal: Positive for nausea, vomiting and abdominal pain. Negative for diarrhea and constipation.  Endocrine: Negative for polydipsia and polyuria.  Genitourinary: Negative for dysuria, frequency, difficulty urinating and pelvic pain.  Musculoskeletal: Negative for arthralgias, back pain, neck pain and neck stiffness.  Skin: Negative for color change and wound.  Allergic/Immunologic: Negative for immunocompromised state.  Neurological: Negative for facial asymmetry, weakness, numbness and headaches.  Hematological: Does not bruise/bleed easily.  Psychiatric/Behavioral: Negative for confusion and agitation.      Allergies  Ivp dye  Home Medications   Prior to Admission medications   Medication Sig Start Date End Date Taking? Authorizing Provider  ALPRAZolam Duanne Moron) 0.5 MG tablet Take 0.5 mg by mouth at bedtime as needed for anxiety.   Yes Historical Provider, MD  amLODipine (NORVASC) 10 MG tablet Take 10 mg by mouth daily.   Yes Historical Provider, MD  estradiol (VIVELLE-DOT) 0.05 MG/24HR patch Place 1 patch onto the skin 2 (two) times a week.   Yes Historical Provider, MD  fluticasone (FLONASE) 50 MCG/ACT nasal spray Place 1 spray into both nostrils daily as needed for allergies or rhinitis.  Yes Historical Provider, MD  lisinopril-hydrochlorothiazide (PRINZIDE,ZESTORETIC) 20-12.5 MG per tablet Take 1 tablet by mouth daily.   Yes Historical Provider, MD  ciprofloxacin (CIPRO) 500 MG tablet Take 1 tablet (500 mg total) by mouth 2 (two) times daily. One po bid x 7 days 07/11/14   Ernestina Patches, MD  metroNIDAZOLE (FLAGYL) 500 MG tablet Take 1 tablet (500 mg total) by mouth 2 (two) times daily. One po bid x 7 days 07/11/14    Ernestina Patches, MD  ondansetron (ZOFRAN ODT) 4 MG disintegrating tablet 4mg  ODT q4 hours prn nausea/vomit 07/11/14   Ernestina Patches, MD  oxyCODONE-acetaminophen (PERCOCET) 5-325 MG per tablet Take 2 tablets by mouth every 4 (four) hours as needed. 07/11/14   Ernestina Patches, MD   BP 115/64  Pulse 111  Temp(Src) 97.9 F (36.6 C) (Oral)  Resp 16  Wt 148 lb 1 oz (67.161 kg)  SpO2 95% Physical Exam  Constitutional: She is oriented to person, place, and time. She appears well-developed and well-nourished. No distress.  HENT:  Head: Normocephalic and atraumatic.  Mouth/Throat: No oropharyngeal exudate.  Eyes: Pupils are equal, round, and reactive to light.  Neck: Normal range of motion. Neck supple.  Cardiovascular: Normal rate, regular rhythm and normal heart sounds.  Exam reveals no gallop and no friction rub.   No murmur heard. Pulmonary/Chest: Effort normal and breath sounds normal. No respiratory distress. She has no wheezes. She has no rales.  Abdominal: Soft. Bowel sounds are normal. She exhibits distension. She exhibits no mass. There is tenderness in the epigastric area. There is no rigidity, no rebound and no guarding.  Musculoskeletal: Normal range of motion. She exhibits no edema and no tenderness.  Neurological: She is alert and oriented to person, place, and time.  Skin: Skin is warm and dry.  Psychiatric: She has a normal mood and affect.    ED Course  Procedures (including critical care time) Labs Review Labs Reviewed  CBC WITH DIFFERENTIAL - Abnormal; Notable for the following:    WBC 12.4 (*)    RBC 3.73 (*)    Hemoglobin 11.7 (*)    Neutro Abs 8.9 (*)    Monocytes Absolute 1.5 (*)    All other components within normal limits  COMPREHENSIVE METABOLIC PANEL - Abnormal; Notable for the following:    Potassium 3.6 (*)    Glucose, Bld 158 (*)    Albumin 3.0 (*)    AST 72 (*)    ALT 41 (*)    Alkaline Phosphatase 232 (*)    GFR calc non Af Amer 71 (*)    GFR calc Af  Amer 82 (*)    Anion gap 16 (*)    All other components within normal limits  LIPASE, BLOOD - Abnormal; Notable for the following:    Lipase 69 (*)    All other components within normal limits  I-STAT CG4 LACTIC ACID, ED    Imaging Review Ct Abdomen Pelvis Wo Contrast  07/11/2014   CLINICAL DATA:  Upper abdominal pain. Status post liver biopsy today.  EXAM: CT ABDOMEN AND PELVIS WITHOUT CONTRAST  TECHNIQUE: Multidetector CT imaging of the abdomen and pelvis was performed following the standard protocol without IV contrast.  COMPARISON:  CT of the abdomen and pelvis 07/09/2014.  FINDINGS: Lung Bases: Mild dependent atelectasis in the lower lobes of the lungs bilaterally. Atherosclerotic calcifications in the right coronary artery. Calcifications of the mitral annulus.  Abdomen/Pelvis: Focal contour abnormalities in the left lobe of the liver corresponding  to suspected mass (see MRI of the liver 07/02/2014). Heterogeneous hypoattenuation throughout the hepatic parenchyma, compatible with heterogeneous hepatic steatosis. Some focal fatty sparing adjacent to the gallbladder fossa is noted. Small calcified gallstones are noted within the dependent portion of the gallbladder. No findings to suggest acute cholecystitis at this time. No intrahepatic or perihepatic high attenuation fluid collection to suggest post biopsy hemorrhage. The unenhanced appearance of the pancreas, spleen and bilateral adrenal glands is unremarkable. Multiple nonobstructive calculi are noted within the collecting systems of the kidneys bilaterally, largest of which measures 9 mm in the lower pole collecting system of the left kidney. No ureteral stones or findings of urinary tract obstruction are noted at this time.  There is some diffuse colonic wall thickening throughout the sigmoid colon with some subtle surrounding haziness in the mesocolon fat and hypervascularity, particularly in the proximal sigmoid colon, concerning for an acute  colitis. Normal appendix. No significant volume of ascites. No pneumoperitoneum. No pathologic distention of small bowel. No definite lymphadenopathy identified in the abdomen or pelvis on today's non contrast CT examination. Status post hysterectomy. Ovaries are not confidently identified may be surgically absent or atrophic. Urinary bladder is normal in appearance.  Musculoskeletal: There are no aggressive appearing lytic or blastic lesions noted in the visualized portions of the skeleton.  IMPRESSION: 1. No intrahepatic or perihepatic high attenuation fluid collection to suggest significant post biopsy hemorrhage. 2. Extensive colonic wall thickening with surrounding inflammatory changes, most notable in the proximal sigmoid colon, compatible with an acute colitis. 3. Cholelithiasis without evidence to suggest acute cholecystitis at this time. 4. Normal appendix. 5. Multiple nonobstructive calculi within the collecting systems of the kidneys bilaterally, largest of which measures 9 mm in the lower pole collecting system of the left kidney. No ureteral stones or findings of urinary tract obstruction are noted at this time. 6. Extensive atherosclerosis, including right coronary artery disease. Please note that although the presence of coronary artery calcium documents the presence of coronary artery disease, the severity of this disease and any potential stenosis cannot be assessed on this non-gated CT examination. Assessment for potential risk factor modification, dietary therapy or pharmacologic therapy may be warranted, if clinically indicated. 7. Abnormal appearance of the left lobe of the liver corresponding to suspected mass based on prior MRI 07/02/2014. Correlation with forthcoming results of today's liver biopsy is recommended. 8. Hepatic steatosis.   Electronically Signed   By: Vinnie Langton M.D.   On: 07/11/2014 00:32   US Biopsy  07/10/2014   CLINICAL DATA:  Left lobe liver lesion  EXAM:  ULTRASOUND-GUIDED BIOPSY OF A LEFT LOBE LIVER LESION.  CORE.  MEDICATIONS AND MEDICAL HISTORY: Versed 2 mg, Fentanyl 100 mcg.  ANESTHESIA/SEDATION: Moderate sedation time: 5 minutes  PROCEDURE: The procedure, risks, benefits, and alternatives were explained to the patient. Questions regarding the procedure were encouraged and answered. The patient understands and consents to the procedure.  The epigastrium was prepped with Betadine in a sterile fashion, and a sterile drape was applied covering the operative field. A sterile gown and sterile gloves were used for the procedure.  Under sonographic guidance, an 17gauge guide needle was advanced into the left lobe liver lesion. Subsequently 4 18 gauge core biopsies were obtained. Gel-Foam slurry was injected into the tract. Final imaging was performed.  Patient tolerated the procedure well without complication. Vital sign monitoring by nursing staff during the procedure will continue as patient is in the special procedures unit for post procedure observation.  FINDINGS: The images document guide needle placement within the left lobe liver lesion. Post biopsy images demonstrate no hemorrhage.  IMPRESSION: Successful ultrasound-guided core biopsy of a left lobe liver lesion.   Electronically Signed   By: Maryclare Bean M.D.   On: 07/10/2014 17:12   Ct Angio Abd/pel W/ And/or W/o  07/09/2014   ADDENDUM REPORT: 07/09/2014 18:38  ADDENDUM: Not mentioned in the impressions is common duct dilatation, without cause identified. There is a periampullary duodenal diverticulum which could be partially positive. Correlation with bilirubin levels and consideration of ERCP suggested.   Electronically Signed   By: Abigail Miyamoto M.D.   On: 07/09/2014 18:38   07/09/2014   CLINICAL DATA:  Concern for bowel ischemia after abnormal colonoscopy. Decreased appetite. Followup of liver lesions.  EXAM: CTA ABDOMEN AND PELVIS wITHOUT AND WITH CONTRAST  TECHNIQUE: Multidetector CT imaging of the  abdomen and pelvis was performed using the standard protocol during bolus administration of intravenous contrast. Multiplanar reconstructed images and MIPs were obtained and reviewed to evaluate the vascular anatomy.  CONTRAST:  152mL OMNIPAQUE IOHEXOL 350 MG/ML SOLN  COMPARISON:  MRI of 07/02/2014.  CT of 06/17/2014 and 05/11/2011.  FINDINGS: Lower Chest: Clear lung bases. Normal heart size without pericardial or pleural effusion. Multivessel coronary artery atherosclerosis.  Abdomen/Pelvis: Hepatic steatosis, moderate. Hepatic dome vague hyper enhancement on image 172/series 4 is similar to on the prior MRI.  Fat sparing with superimposed Portal venous phase ill-defined hypo enhancement traversing the medial and lateral segment left liver lobes. Measures 4.5 x 4.7 cm on image 181/ series 3. Concurrent atrophy and architectural distortion within the surrounding left lobe. The intrahepatic biliary ductal dilatation detailed on the prior MRI is not readily apparent. Somewhat more focal focus of hypoattenuation in the left lateral segment measures 11 mm on image 185/series 4 and corresponds to the abnormality on prior MRI.  Patent hepatic veins.  The portal veins are patent centrally.  Normal spleen, stomach.  Periampullary duodenal diverticulum. Normal gallbladder. Cholelithiasis on prior MRI not well visualized CT.  Common duct is dilated for age, 11 mm on coronal image 58. No pancreatic ductal dilatation or evidence of choledocholithiasis.  Normal adrenal glands.  Bilateral renal cysts.  The celiac axis and superior mesenteric artery are both widely patent. Mildly age advanced aortic and branch vessel atherosclerosis. No significant stenosis at the origin of the renal arteries. The inferior mesenteric artery is patent.  No retroperitoneal or retrocrural adenopathy.  Underdistended sigmoid colon on image 224. Colonic stool burden suggests constipation. The previously described areas of colonic wall thickening are  not identified. Normal terminal ileum and appendix.  No small bowel wall thickening to suggest enteritis. No bowel obstruction, ascites, or pneumatosis.  Moderate atherosclerosis involving the pelvic vasculature, without significant stenosis or aneurysm. Both internal iliac arteries are patent.  No pelvic adenopathy. Contrast within the urinary bladder. Hysterectomy. No adnexal mass or significant free fluid.  Bones/Musculoskeletal: No acute osseous abnormality. 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  Review of the MIP images confirms the above findings.  IMPRESSION: 1. Aortic and branch vessel atherosclerosis, without significant stenosis or evidence of bowel ischemia. 2. Dominant left hepatic lobe area of abnormal enhancement with concurrent atrophy and anatomic distortion. Similar in appearance to the recent MRI. Considerations include cholangiocarcinoma or, if the patient has otherwise occult cirrhosis, an area of focal confluent hepatic fibrosis. Tissue sampling should be considered. A hepatic dome focus of vague hyper enhancement is indeterminate and  can be re-evaluated on follow-up. 3. The areas of colonic wall thickening have resolved and were likely due to colitis. Possible constipation.  Electronically Signed: By: Abigail Miyamoto M.D. On: 07/09/2014 17:29     EKG Interpretation None      MDM   Final diagnoses:  Colitis    Pt is a 68 y.o. female with Pmhx as above who presents with sudden onset severe upper abdominal pain about 6 hrs after a liver bx.  On PE, she is mildly tachycardic, VS otherwise stable in NAD. +BS, abdomen distended. +epigastric ttp w/o rebound or guarding. WBC mildly elevated, though improved, LFTs elevated as seen prior. Lipase mildly elevated at 69.  LA nml. CT ab/pelvis with acute colitis. No acute hemorrhage or injury assoc w/ bx.  Pt reports feeling much better after IV zofran and narcotics. Will do PO trial and start PO  cipro/flagyl. Will have her f/u closely with GI and rec she call GI office tomorrow to discuss ED visit.         Ernestina Patches, MD 07/11/14 [phone]

## 2014-07-11 NOTE — H&P (Signed)
[facility] History and Physical  Brianna Duncan ELF:[id number] DOB: Jan 31, 1946 DOA: 07/10/2014  Referring physician: ER physician. PCP: Wendie Agreste, MD  Chief Complaint: Abdominal pain.  HPI: Brianna Duncan is a 68 y.o. female with history of hypertension who was recently diagnosed with liver mass and had a biopsy yesterday for which patient had abdominal pain and presented to the ER and had a repeat CT scan without contrast which did not show any hematoma but did show features concerning for colitis. Patient also was recently diagnosed with ischemic colitis as per the notes. Patient has had some rectal bleeding last month and had followed up with her gastroenterologist who had performed colonoscopy and had shown ischemic colitis as per the notes. In the ER patient was found to be hypoxic. Patient had required at least 3 L of oxygen. ER physician was suspecting possible PE and CT angiogram of the chest was ordered which patient will require prednisone and Benadryl preparation prior to CT angiogram as patient is allergic to IV dye. On my exam patient is not in acute distress but states that she gets weak on walking but denies any chest pain or shortness of breath productive cough fever chills. Patient states her abdominal pain has completely resolved after the pain medication and bowel movement she has had after coming to the ER. Patient states she has been having loose stools for last few days. After the initial incident of rectal bleeding last month patient states she has not had any further episodes of GI bleed. Patient presently is hemodynamically stable. Patient has received hydrocortisone and is about to receive Benadryl and will be going for CT angiogram to rule out PE for further workup of hypoxia.   Review of Systems: As presented in the history of presenting illness, rest negative.  Past Medical History  Diagnosis Date  . Hypertension   . Allergy   . Anxiety   .  Osteoporosis    Past Surgical History  Procedure Laterality Date  . Abdominal hysterectomy    . Foot surgery      right foot, left great toe and left second toe surgery   Social History:  reports that she quit smoking about 14 years ago. She has never used smokeless tobacco. She reports that she drinks about .6 ounces of alcohol per week. She reports that she does not use illicit drugs. Where does patient live home. Can patient participate in ADLs? Yes.  Allergies  Allergen Reactions  . Ivp Dye [Iodinated Diagnostic Agents] Hives    Family History:  Family History  Problem Relation Age of Onset  . Heart disease Mother   . Stroke Father   . Stroke Sister   . Heart disease Brother       Prior to Admission medications   Medication Sig Start Date End Date Taking? Authorizing Provider  ALPRAZolam Duanne Moron) 0.5 MG tablet Take 0.5 mg by mouth at bedtime as needed for anxiety.   Yes Historical Provider, MD  amLODipine (NORVASC) 10 MG tablet Take 10 mg by mouth daily.   Yes Historical Provider, MD  estradiol (VIVELLE-DOT) 0.05 MG/24HR patch Place 1 patch onto the skin 2 (two) times a week.   Yes Historical Provider, MD  fluticasone (FLONASE) 50 MCG/ACT nasal spray Place 1 spray into both nostrils daily as needed for allergies or rhinitis.   Yes Historical Provider, MD  lisinopril-hydrochlorothiazide (PRINZIDE,ZESTORETIC) 20-12.5 MG per tablet Take 1 tablet by mouth daily.   Yes Historical Provider, MD  ciprofloxacin (CIPRO)  500 MG tablet Take 1 tablet (500 mg total) by mouth 2 (two) times daily. One po bid x 7 days 07/11/14   Ernestina Patches, MD  metroNIDAZOLE (FLAGYL) 500 MG tablet Take 1 tablet (500 mg total) by mouth 2 (two) times daily. One po bid x 7 days 07/11/14   Ernestina Patches, MD  ondansetron (ZOFRAN ODT) 4 MG disintegrating tablet 4mg  ODT q4 hours prn nausea/vomit 07/11/14   Ernestina Patches, MD  oxyCODONE-acetaminophen (PERCOCET) 5-325 MG per tablet Take 2 tablets by mouth every 4  (four) hours as needed. 07/11/14   Ernestina Patches, MD    Physical Exam: Filed Vitals:   07/11/14 0145 07/11/14 0330 07/11/14 0415 07/11/14 0518  BP: 110/57 99/51 93/39  110/78  Pulse: 112 93 98 95  Temp:    98.8 F (37.1 C)  TempSrc:    Oral  Resp: 24 18 15 18   Height:    4\' 11"  (1.499 m)  Weight:    67.2 kg (148 lb 2.4 oz)  SpO2: 95% 95% 94% 99%     General:  Moderately built and nourished.  Eyes: Anicteric no pallor.  ENT: No discharge from the ears eyes nose mouth.  Neck: No mass felt.  Cardiovascular: S1-S2 heard.  Respiratory: No rhonchi or crepitations.  Abdomen: Soft nontender bowel sounds present. No guarding rigidity.  Skin: No rash.  Musculoskeletal: No edema.  Psychiatric: Appears normal.  Neurologic: Alert and oriented to time place and person. Moves all extremities.  Labs on Admission:  Basic Metabolic Panel:  Recent Labs Lab 07/10/14 2100  NA 140  K 3.6*  CL 101  CO2 23  GLUCOSE 158*  BUN 18  CREATININE 0.83  CALCIUM 8.5   Liver Function Tests:  Recent Labs Lab 07/10/14 2100  AST 72*  ALT 41*  ALKPHOS 232*  BILITOT 1.2  PROT 6.9  ALBUMIN 3.0*    Recent Labs Lab 07/10/14 2100  LIPASE 69*   No results found for this basename: AMMONIA,  in the last 168 hours CBC:  Recent Labs Lab 07/10/14 0825 07/10/14 2100  WBC 12.9* 12.4*  NEUTROABS  --  8.9*  HGB 11.7* 11.7*  HCT 35.6* 36.1  MCV 96.0 96.8  PLT 327 322   Cardiac Enzymes: No results found for this basename: CKTOTAL, CKMB, CKMBINDEX, TROPONINI,  in the last 168 hours  BNP (last 3 results) No results found for this basename: PROBNP,  in the last 8760 hours CBG: No results found for this basename: GLUCAP,  in the last 168 hours  Radiological Exams on Admission: Ct Abdomen Pelvis Wo Contrast  07/11/2014   CLINICAL DATA:  Upper abdominal pain. Status post liver biopsy today.  EXAM: CT ABDOMEN AND PELVIS WITHOUT CONTRAST  TECHNIQUE: Multidetector CT imaging of the  abdomen and pelvis was performed following the standard protocol without IV contrast.  COMPARISON:  CT of the abdomen and pelvis 07/09/2014.  FINDINGS: Lung Bases: Mild dependent atelectasis in the lower lobes of the lungs bilaterally. Atherosclerotic calcifications in the right coronary artery. Calcifications of the mitral annulus.  Abdomen/Pelvis: Focal contour abnormalities in the left lobe of the liver corresponding to suspected mass (see MRI of the liver 07/02/2014). Heterogeneous hypoattenuation throughout the hepatic parenchyma, compatible with heterogeneous hepatic steatosis. Some focal fatty sparing adjacent to the gallbladder fossa is noted. Small calcified gallstones are noted within the dependent portion of the gallbladder. No findings to suggest acute cholecystitis at this time. No intrahepatic or perihepatic high attenuation fluid collection to suggest post biopsy  hemorrhage. The unenhanced appearance of the pancreas, spleen and bilateral adrenal glands is unremarkable. Multiple nonobstructive calculi are noted within the collecting systems of the kidneys bilaterally, largest of which measures 9 mm in the lower pole collecting system of the left kidney. No ureteral stones or findings of urinary tract obstruction are noted at this time.  There is some diffuse colonic wall thickening throughout the sigmoid colon with some subtle surrounding haziness in the mesocolon fat and hypervascularity, particularly in the proximal sigmoid colon, concerning for an acute colitis. Normal appendix. No significant volume of ascites. No pneumoperitoneum. No pathologic distention of small bowel. No definite lymphadenopathy identified in the abdomen or pelvis on today's non contrast CT examination. Status post hysterectomy. Ovaries are not confidently identified may be surgically absent or atrophic. Urinary bladder is normal in appearance.  Musculoskeletal: There are no aggressive appearing lytic or blastic lesions noted  in the visualized portions of the skeleton.  IMPRESSION: 1. No intrahepatic or perihepatic high attenuation fluid collection to suggest significant post biopsy hemorrhage. 2. Extensive colonic wall thickening with surrounding inflammatory changes, most notable in the proximal sigmoid colon, compatible with an acute colitis. 3. Cholelithiasis without evidence to suggest acute cholecystitis at this time. 4. Normal appendix. 5. Multiple nonobstructive calculi within the collecting systems of the kidneys bilaterally, largest of which measures 9 mm in the lower pole collecting system of the left kidney. No ureteral stones or findings of urinary tract obstruction are noted at this time. 6. Extensive atherosclerosis, including right coronary artery disease. Please note that although the presence of coronary artery calcium documents the presence of coronary artery disease, the severity of this disease and any potential stenosis cannot be assessed on this non-gated CT examination. Assessment for potential risk factor modification, dietary therapy or pharmacologic therapy may be warranted, if clinically indicated. 7. Abnormal appearance of the left lobe of the liver corresponding to suspected mass based on prior MRI 07/02/2014. Correlation with forthcoming results of today's liver biopsy is recommended. 8. Hepatic steatosis.   Electronically Signed   By: Vinnie Langton M.D.   On: 07/11/2014 00:32   Dg Chest 2 View  07/11/2014   CLINICAL DATA:  Hypoxemia.  History of smoking.  EXAM: CHEST  2 VIEW  COMPARISON:  Chest radiograph performed 04/11/2014  FINDINGS: The lungs are well-aerated and clear. There is no evidence of focal opacification, pleural effusion or pneumothorax.  The heart is borderline normal in size; the mediastinal contour is within normal limits. No acute osseous abnormalities are seen.  IMPRESSION: No acute cardiopulmonary process seen.   Electronically Signed   By: Garald Balding M.D.   On: 07/11/2014  03:33   US Biopsy  07/10/2014   CLINICAL DATA:  Left lobe liver lesion  EXAM: ULTRASOUND-GUIDED BIOPSY OF A LEFT LOBE LIVER LESION.  CORE.  MEDICATIONS AND MEDICAL HISTORY: Versed 2 mg, Fentanyl 100 mcg.  ANESTHESIA/SEDATION: Moderate sedation time: 5 minutes  PROCEDURE: The procedure, risks, benefits, and alternatives were explained to the patient. Questions regarding the procedure were encouraged and answered. The patient understands and consents to the procedure.  The epigastrium was prepped with Betadine in a sterile fashion, and a sterile drape was applied covering the operative field. A sterile gown and sterile gloves were used for the procedure.  Under sonographic guidance, an 17gauge guide needle was advanced into the left lobe liver lesion. Subsequently 4 18 gauge core biopsies were obtained. Gel-Foam slurry was injected into the tract. Final imaging was performed.  Patient  tolerated the procedure well without complication. Vital sign monitoring by nursing staff during the procedure will continue as patient is in the special procedures unit for post procedure observation.  FINDINGS: The images document guide needle placement within the left lobe liver lesion. Post biopsy images demonstrate no hemorrhage.  IMPRESSION: Successful ultrasound-guided core biopsy of a left lobe liver lesion.   Electronically Signed   By: Maryclare Bean M.D.   On: 07/10/2014 17:12   Ct Angio Abd/pel W/ And/or W/o  07/09/2014   ADDENDUM REPORT: 07/09/2014 18:38  ADDENDUM: Not mentioned in the impressions is common duct dilatation, without cause identified. There is a periampullary duodenal diverticulum which could be partially positive. Correlation with bilirubin levels and consideration of ERCP suggested.   Electronically Signed   By: Abigail Miyamoto M.D.   On: 07/09/2014 18:38   07/09/2014   CLINICAL DATA:  Concern for bowel ischemia after abnormal colonoscopy. Decreased appetite. Followup of liver lesions.  EXAM: CTA ABDOMEN AND  PELVIS wITHOUT AND WITH CONTRAST  TECHNIQUE: Multidetector CT imaging of the abdomen and pelvis was performed using the standard protocol during bolus administration of intravenous contrast. Multiplanar reconstructed images and MIPs were obtained and reviewed to evaluate the vascular anatomy.  CONTRAST:  168mL OMNIPAQUE IOHEXOL 350 MG/ML SOLN  COMPARISON:  MRI of 07/02/2014.  CT of 06/17/2014 and 05/11/2011.  FINDINGS: Lower Chest: Clear lung bases. Normal heart size without pericardial or pleural effusion. Multivessel coronary artery atherosclerosis.  Abdomen/Pelvis: Hepatic steatosis, moderate. Hepatic dome vague hyper enhancement on image 172/series 4 is similar to on the prior MRI.  Fat sparing with superimposed Portal venous phase ill-defined hypo enhancement traversing the medial and lateral segment left liver lobes. Measures 4.5 x 4.7 cm on image 181/ series 3. Concurrent atrophy and architectural distortion within the surrounding left lobe. The intrahepatic biliary ductal dilatation detailed on the prior MRI is not readily apparent. Somewhat more focal focus of hypoattenuation in the left lateral segment measures 11 mm on image 185/series 4 and corresponds to the abnormality on prior MRI.  Patent hepatic veins.  The portal veins are patent centrally.  Normal spleen, stomach.  Periampullary duodenal diverticulum. Normal gallbladder. Cholelithiasis on prior MRI not well visualized CT.  Common duct is dilated for age, 11 mm on coronal image 58. No pancreatic ductal dilatation or evidence of choledocholithiasis.  Normal adrenal glands.  Bilateral renal cysts.  The celiac axis and superior mesenteric artery are both widely patent. Mildly age advanced aortic and branch vessel atherosclerosis. No significant stenosis at the origin of the renal arteries. The inferior mesenteric artery is patent.  No retroperitoneal or retrocrural adenopathy.  Underdistended sigmoid colon on image 224. Colonic stool burden suggests  constipation. The previously described areas of colonic wall thickening are not identified. Normal terminal ileum and appendix.  No small bowel wall thickening to suggest enteritis. No bowel obstruction, ascites, or pneumatosis.  Moderate atherosclerosis involving the pelvic vasculature, without significant stenosis or aneurysm. Both internal iliac arteries are patent.  No pelvic adenopathy. Contrast within the urinary bladder. Hysterectomy. No adnexal mass or significant free fluid.  Bones/Musculoskeletal: No acute osseous abnormality. 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  Review of the MIP images confirms the above findings.  IMPRESSION: 1. Aortic and branch vessel atherosclerosis, without significant stenosis or evidence of bowel ischemia. 2. Dominant left hepatic lobe area of abnormal enhancement with concurrent atrophy and anatomic distortion. Similar in appearance to the recent MRI. Considerations include cholangiocarcinoma or,  if the patient has otherwise occult cirrhosis, an area of focal confluent hepatic fibrosis. Tissue sampling should be considered. A hepatic dome focus of vague hyper enhancement is indeterminate and can be re-evaluated on follow-up. 3. The areas of colonic wall thickening have resolved and were likely due to colitis. Possible constipation.  Electronically Signed: By: Abigail Miyamoto M.D. On: 07/09/2014 17:29     Assessment/Plan Principal Problem:   Hypoxia Active Problems:   Unspecified essential hypertension   Colitis   Liver mass   1. Hypoxia - cause not clear. CT angiogram of the chest to rule out PE is pending and patient is receiving preparation with prednisone and Benadryl since patient is allergic to dye. Further plans based on CT chest results. Check BNP. 2. Colitis - as per the notes patient's recent colonoscopy done by patient's gastroenterologist showed ischemic colitis. Patient presently is abdominal pain-free.  Check lactic acid levels and stool for C. difficile. Continue with gentle hydration and patient was started on Cipro and Flagyl which we will continue for now. 3. Hypertension - continue present medication except for HCTZ as patient is getting gentle hydration. 4. Elevated LFTs and liver mass - patient just had biopsy today. Follow biopsy results. Check acute hepatitis panel. Follow LFTs closely. 5. Anemia - follow CBC. Patient has had recent GI bleed.    Code Status: Full code.  Family Communication: None.  Disposition Plan: Admit for observation.    Kemuel Buchmann N. [facility] Pager [phone].  If 7PM-7AM, please contact night-coverage [email or web address] Password [facility] 07/11/2014, 6:26 AM

## 2014-07-11 NOTE — Progress Notes (Signed)
Patient ID: Brianna Duncan, female   DOB: [birth year], 68 y.o.   MRN: [id number]   Pt with liver lesion and high LFTs Sent to IR as OP 8/13 for liver lesion biopsy  Successful biopsy performed without complication by Dr Barbie Banner Sent home home after 2 hr observation in [facility]  Pt did well at home til approx 500pm Developed abd pain and sob N no vomitting  Came to ED CT was neg for hepatic hemorrhage CTA neg for PE  Admitted for observation---now admitted to [facility]  I have seen pt Comfortable in bed Denies N or abd pain Breathing better 96% 3L 02 Afeb; vss   Will report to Dr Barbie Banner

## 2014-07-11 NOTE — Evaluation (Signed)
Physical Therapy Evaluation Patient Details Name: Brianna Duncan MRN: [id number] DOB: September 08, 1946 Today's Date: 07/11/2014   History of Present Illness  Patient is a 67 yo female admitted 07/10/14 with abdominal pain, weakness, and hypoxia.  CT angio negative for PE.  Patient with liver biopsy on day of admit.  PMH:  HTN, liver mass, anxiety, osteoporosis.  Clinical Impression  Patient is independent with all mobility and ambulation.  Demonstrates good balance with gait.  No acute PT needs identified - PT will sign off.  Encouraged ambulation in hallway with nursing assist.    Follow Up Recommendations No PT follow up;Supervision - Intermittent    Equipment Recommendations  None recommended by PT    Recommendations for Other Services       Precautions / Restrictions Precautions Precautions: None Restrictions Weight Bearing Restrictions: No      Mobility  Bed Mobility Overal bed mobility: Independent                Transfers Overall transfer level: Independent Equipment used: None             General transfer comment: Encouraged patient to move more slowly for safety.  Ambulation/Gait Ambulation/Gait assistance: Modified independent (Device/Increase time) Ambulation Distance (Feet): 200 Feet Assistive device: None Gait Pattern/deviations: Step-through pattern Gait velocity: WFL Gait velocity interpretation: at or above normal speed for age/gender General Gait Details: Verbal cues to move at slower pace for safety.  Patient with good balance.  Stairs            Wheelchair Mobility    Modified Rankin (Stroke Patients Only)       Balance Overall balance assessment: No apparent balance deficits (not formally assessed)                                           Pertinent Vitals/Pain Pain Assessment: No/denies pain    Home Living Family/patient expects to be discharged to:: Private residence Living Arrangements: Other  relatives (Sister) Available Help at Discharge: Family;Available 24 hours/day (Daughter lives nearby) Type of Home: House Home Access: Ramped entrance     [place]: One level Home Equipment: Environmental consultant - 2 wheels;Cane - single point      Prior Function Level of Independence: Independent               Hand Dominance        Extremity/Trunk Assessment   Upper Extremity Assessment: Overall WFL for tasks assessed           Lower Extremity Assessment: Overall WFL for tasks assessed      Cervical / Trunk Assessment: Normal  Communication   Communication: No difficulties  Cognition Arousal/Alertness: Awake/alert Behavior During Therapy: WFL for tasks assessed/performed Overall Cognitive Status: Within Functional Limits for tasks assessed                      General Comments      Exercises        Assessment/Plan    PT Assessment Patent does not need any further PT services  PT Diagnosis     PT Problem List    PT Treatment Interventions     PT Goals (Current goals can be found in the Care Plan section) Acute Rehab PT Goals PT Goal Formulation: No goals set, d/c therapy    Frequency     Barriers to discharge  Co-evaluation               End of Session   Activity Tolerance: Patient tolerated treatment well Patient left: in bed;with call bell/phone within reach;with family/visitor present Nurse Communication: Mobility status (Encouraged ambulation in hallway with nursing')         Time: 1424-1435 PT Time Calculation (min): 11 min   Charges:   PT Evaluation $Initial PT Evaluation Tier I: 1 Procedure PT Treatments $Gait Training: 8-22 mins   PT G Codes:          Despina Pole 07/11/2014, 4:43 PM Carita Pian. Sanjuana Kava, [place] Pager [phone]

## 2014-07-12 DIAGNOSIS — I5032 Chronic diastolic (congestive) heart failure: Secondary | ICD-10-CM | POA: Insufficient documentation

## 2014-07-12 DIAGNOSIS — I5031 Acute diastolic (congestive) heart failure: Secondary | ICD-10-CM | POA: Diagnosis present

## 2014-07-12 LAB — COMPREHENSIVE METABOLIC PANEL
ALT: 47 U/L — ABNORMAL HIGH (ref 0–35)
ANION GAP: 12 (ref 5–15)
AST: 65 U/L — ABNORMAL HIGH (ref 0–37)
Albumin: 2.5 g/dL — ABNORMAL LOW (ref 3.5–5.2)
Alkaline Phosphatase: 243 U/L — ABNORMAL HIGH (ref 39–117)
BILIRUBIN TOTAL: 0.8 mg/dL (ref 0.3–1.2)
BUN: 11 mg/dL (ref 6–23)
CHLORIDE: 103 meq/L (ref 96–112)
CO2: 23 mEq/L (ref 19–32)
CREATININE: 0.66 mg/dL (ref 0.50–1.10)
Calcium: 8.6 mg/dL (ref 8.4–10.5)
GFR calc non Af Amer: 89 mL/min — ABNORMAL LOW (ref >90)
Glucose, Bld: 116 mg/dL — ABNORMAL HIGH (ref 70–99)
Potassium: 3.5 mEq/L — ABNORMAL LOW (ref 3.7–5.3)
Sodium: 138 mEq/L (ref 137–147)
TOTAL PROTEIN: 5.8 g/dL — AB (ref 6.0–8.3)

## 2014-07-12 LAB — CBC
HEMATOCRIT: 32.2 % — AB (ref 36.0–46.0)
Hemoglobin: 10.4 g/dL — ABNORMAL LOW (ref 12.0–15.0)
MCH: 31.4 pg (ref 26.0–34.0)
MCHC: 32.3 g/dL (ref 30.0–36.0)
MCV: 97.3 fL (ref 78.0–100.0)
Platelets: 243 10*3/uL (ref 150–400)
RBC: 3.31 MIL/uL — ABNORMAL LOW (ref 3.87–5.11)
RDW: 14.1 % (ref 11.5–15.5)
WBC: 6.4 10*3/uL (ref 4.0–10.5)

## 2014-07-12 LAB — CLOSTRIDIUM DIFFICILE BY PCR: CDIFFPCR: NEGATIVE

## 2014-07-12 MED ORDER — CIPROFLOXACIN HCL 500 MG PO TABS
500.0000 mg | ORAL_TABLET | Freq: Two times a day (BID) | ORAL | Status: DC
  Administered 2014-07-12 – 2014-07-15 (×6): 500 mg via ORAL
  Filled 2014-07-12 (×8): qty 1

## 2014-07-12 MED ORDER — ALPRAZOLAM 0.25 MG PO TABS
0.2500 mg | ORAL_TABLET | ORAL | Status: AC
  Administered 2014-07-12: 0.25 mg via ORAL
  Filled 2014-07-12: qty 1

## 2014-07-12 MED ORDER — METRONIDAZOLE 500 MG PO TABS
500.0000 mg | ORAL_TABLET | Freq: Three times a day (TID) | ORAL | Status: DC
  Administered 2014-07-12 – 2014-07-15 (×8): 500 mg via ORAL
  Filled 2014-07-12 (×11): qty 1

## 2014-07-12 MED ORDER — FUROSEMIDE 10 MG/ML IJ SOLN
20.0000 mg | Freq: Two times a day (BID) | INTRAMUSCULAR | Status: DC
  Administered 2014-07-12 – 2014-07-15 (×6): 20 mg via INTRAVENOUS
  Filled 2014-07-12 (×6): qty 2

## 2014-07-12 NOTE — Progress Notes (Signed)
PROGRESS NOTE  Brianna Duncan MWU:[id number] DOB: [birth year] DOA: 07/10/2014 PCP: Wendie Agreste, MD  HPI/Recap of past 69 hours: 68 year-old white female admitted for shortness of breath and abdominal pain found to have colitis on CT. Recently diagnosed with ischemic colitis.  Echocardiogram noted grade 1 diastolic dysfunction. BNP normal. Overall patient improved with little if any diarrhea and no abdominal pain. Breathing is also much easier  Assessment/Plan: Principal Problem:   Hypoxia: Unclear etiology. No signs of COPD or pneumonia. Suspect some component to be heart failure, started diuresis. See below. Active Problems:   Unspecified essential hypertension: Stable.   Colitis: Likely ischemic. Patient did have mild leukocytosis on admission. Change IV antibiotics to by mouth. If ischemic, will improve by treating heart failure.    Liver mass: Patient underwent outpatient liver biopsy. Results pending, likely back on Monday.  Acute on Chronic diastolic heart failure: Although BNP normal, patient states her weight, which is heavy for her is 144 pounds. She says she's put on about 10 pounds for eating in the past year. She last weighed herself at home 3 days ago. Her weight here consistently has been 148 which is suspect is from acute diastolic heart failure. We'll stop IV fluids start IV Lasix plus daily weights plus strict input/output.  Code Status: Full code  Family Communication: Daughter at the bedside  Disposition Plan: Awaiting liver biopsy results, and diuresing   Consultants:  Cardiology  Procedures:  Echocardiogram: Grade 1 diastolic dysfunction  Antibiotics:  Cipro 8/13-present  Flagyl 8/13-present  HPI/Subjective: Patient doing okay. No abdominal pain, minimal diarrhea. Breathing easier  Objective: BP 101/66  Pulse 98  Temp(Src) 99.3 F (37.4 C) (Oral)  Resp 18  Ht 4\' 11"  (1.499 m)  Wt 67.2 kg (148 lb 2.4 oz)  BMI 29.91 kg/m2  SpO2  93%  Intake/Output Summary (Last 24 hours) at 07/12/14 1612 Last data filed at 07/12/14 1500  Gross per 24 hour  Intake 3651.25 ml  Output   1304 ml  Net 2347.25 ml   Filed Weights   07/10/14 2025 07/11/14 0518 07/12/14 0700  Weight: 67.161 kg (148 lb 1 oz) 67.2 kg (148 lb 2.4 oz) 67.2 kg (148 lb 2.4 oz)    Exam:   General:  Alert and oriented x3, no acute distress  Cardiovascular: Regular rate and rhythm, S1-S2  Respiratory: Clear to auscultation bilaterally  Abdomen: Soft, distended, nontender, positive bowel sounds  Musculoskeletal: No clubbing or cyanosis, trace edema   Data Reviewed: Basic Metabolic Panel:  Recent Labs Lab 07/10/14 2100 07/11/14 0920 07/12/14 0320  NA 140 135* 138  K 3.6* 4.3 3.5*  CL 101 99 103  CO2 23 24 23   GLUCOSE 158* 216* 116*  BUN 18 13 11   CREATININE 0.83 0.67 0.66  CALCIUM 8.5 8.5 8.6   Liver Function Tests:  Recent Labs Lab 07/10/14 2100 07/11/14 0920 07/12/14 0320  AST 72* 109* 65*  ALT 41* 61* 47*  ALKPHOS 232* 289* 243*  BILITOT 1.2 1.3* 0.8  PROT 6.9 6.3 5.8*  ALBUMIN 3.0* 2.7* 2.5*    Recent Labs Lab 07/10/14 2100  LIPASE 69*   No results found for this basename: AMMONIA,  in the last 168 hours CBC:  Recent Labs Lab 07/10/14 0825 07/10/14 2100 07/11/14 0920 07/12/14 0320  WBC 12.9* 12.4* 8.0 6.4  NEUTROABS  --  8.9* 7.0  --   HGB 11.7* 11.7* 11.0* 10.4*  HCT 35.6* 36.1 34.2* 32.2*  MCV 96.0 96.8 97.7 97.3  PLT 327 322 275 243   Cardiac Enzymes:   No results found for this basename: CKTOTAL, CKMB, CKMBINDEX, TROPONINI,  in the last 168 hours BNP (last 3 results)  Recent Labs  07/11/14 0920  PROBNP 30.2   CBG: No results found for this basename: GLUCAP,  in the last 168 hours  No results found for this or any previous visit (from the past 240 hour(s)).   Studies: Dg Chest 2 View  07/11/2014   CLINICAL DATA:  Hypoxemia.  History of smoking.  EXAM: CHEST  2 VIEW  COMPARISON:  Chest  radiograph performed 04/11/2014  FINDINGS: The lungs are well-aerated and clear. There is no evidence of focal opacification, pleural effusion or pneumothorax.  The heart is borderline normal in size; the mediastinal contour is within normal limits. No acute osseous abnormalities are seen.  IMPRESSION: No acute cardiopulmonary process seen.   Electronically Signed   By: Garald Balding M.D.   On: 07/11/2014 03:33   Ct Angio Chest Pe W/cm &/or Wo Cm  07/11/2014   CLINICAL DATA:  68 year old female status post liver mass biopsy, hypoxia, possible abdominal ischemia. Initial encounter.  EXAM: CT ANGIOGRAPHY CHEST WITH CONTRAST  TECHNIQUE: Multidetector CT imaging of the chest was performed using the standard protocol during bolus administration of intravenous contrast. Multiplanar CT image reconstructions and MIPs were obtained to evaluate the vascular anatomy.  CONTRAST:  13mL OMNIPAQUE IOHEXOL 350 MG/ML SOLN  COMPARISON:  Chest radiographs 0301 hr the same day and earlier. CT Abdomen and Pelvis 07/10/2014 and earlier.  FINDINGS: Good contrast bolus timing in the pulmonary arterial tree.  No focal filling defect identified in the pulmonary arterial tree to suggest the presence of acute pulmonary embolism.  Major airways are patent except for atelectatic changes. There is dependent atelectasis in both upper and lower lobes. There is superimposed left lower lobe anterior and lateral basal segment consolidation, stable from the recent CT Abdomen and Pelvis (series 406, image 70).  Possible superimposed upper lobe centrilobular emphysema. 3 mm nodule in the anterior right middle lobe on image 45.  No pleural effusion. Cardiomegaly. No pericardial effusion. No mediastinal lymphadenopathy. Negative thoracic inlet. No axillary lymphadenopathy.  Mild calcified plaque in the visible aorta. Left coronary artery calcified plaque is evident. No mural or adherent proximal aortic thrombus identified.  Chronic mesenteric fat  within a hiatal hernia. Difficult to exclude mild esophageal wall thickening. New small volume fluid or increased gastrohepatic lymph node with in the hernia on series 41, image 64 measuring 9 mm short axis. Advanced hepatic steatosis partially re - identified. No free fluid identified in the upper abdomen.  Thoracic compression fractures affecting the T4, T5, and T8 superior endplates. These appear to be chronic. Incidental benign vertebral body hemangioma in the right T2 pedicle. Overall bone mineralization within normal limits for age. No acute or suspicious osseous lesion identified.  Review of the MIP images confirms the above findings.  IMPRESSION: 1.  No evidence of acute pulmonary embolus. 2. Stable anterior basal segment left lower lobe consolidation from the recent CT Abdomen and Pelvis. Superimposed bilateral pulmonary atelectasis. 3. Anterior right middle lobe 3 mm pulmonary nodule. Recommend followup chest CT in 12 months If the patient is at high risk for bronchogenic carcinoma. This recommendation follows the consensus statement: Guidelines for Management of Small Pulmonary Nodules Detected on CT Scans: A Statement from the [place] as published in Radiology 2005; 237:395-400. 4. Small volume increased fluid or increased gastrohepatic lymph node within the  chronic hiatal mesenteric hernia. Hepatic steatosis re - identified. 5. Mild for age calcified plaque in the visible aorta.   Electronically Signed   By: Lars Pinks M.D.   On: 07/11/2014 07:22    Scheduled Meds: . amLODipine  10 mg Oral Daily  . ciprofloxacin  500 mg Oral BID  . enoxaparin (LOVENOX) injection  40 mg Subcutaneous Q24H  . estradiol  0.05 mg Transdermal Weekly  . furosemide  20 mg Intravenous Q12H  . lisinopril  20 mg Oral Daily  . metroNIDAZOLE  500 mg Oral 3 times per day  . sodium chloride  3 mL Intravenous Q12H    Continuous Infusions:    Time spent: 25 minutes  [facility] Pager [phone]. If 7PM-7AM, please contact night-coverage at [email or web address], password [email or web address] 07/12/2014, 4:12 PM  LOS: 2 days

## 2014-07-13 ENCOUNTER — Inpatient Hospital Stay (HOSPITAL_COMMUNITY): Payer: Medicare Other

## 2014-07-13 LAB — CBC
HCT: 36.7 % (ref 36.0–46.0)
HEMOGLOBIN: 11.8 g/dL — AB (ref 12.0–15.0)
MCH: 31.8 pg (ref 26.0–34.0)
MCHC: 32.2 g/dL (ref 30.0–36.0)
MCV: 98.9 fL (ref 78.0–100.0)
Platelets: 315 10*3/uL (ref 150–400)
RBC: 3.71 MIL/uL — ABNORMAL LOW (ref 3.87–5.11)
RDW: 14.3 % (ref 11.5–15.5)
WBC: 9.5 10*3/uL (ref 4.0–10.5)

## 2014-07-13 LAB — BASIC METABOLIC PANEL
ANION GAP: 10 (ref 5–15)
BUN: 9 mg/dL (ref 6–23)
CHLORIDE: 101 meq/L (ref 96–112)
CO2: 25 meq/L (ref 19–32)
Calcium: 8.6 mg/dL (ref 8.4–10.5)
Creatinine, Ser: 0.66 mg/dL (ref 0.50–1.10)
GFR calc Af Amer: >90 mL/min (ref >90)
GFR calc non Af Amer: 89 mL/min — ABNORMAL LOW (ref >90)
GLUCOSE: 127 mg/dL — AB (ref 70–99)
POTASSIUM: 3.4 meq/L — AB (ref 3.7–5.3)
Sodium: 136 mEq/L — ABNORMAL LOW (ref 137–147)

## 2014-07-13 LAB — LIPASE, BLOOD: Lipase: 34 U/L (ref 11–59)

## 2014-07-13 MED ORDER — CALCIUM CARBONATE ANTACID 500 MG PO CHEW
400.0000 mg | CHEWABLE_TABLET | Freq: Two times a day (BID) | ORAL | Status: DC | PRN
  Administered 2014-07-13 – 2014-07-14 (×3): 400 mg via ORAL
  Filled 2014-07-13 (×3): qty 2

## 2014-07-13 NOTE — Progress Notes (Signed)
PROGRESS NOTE  Brianna Duncan ZHY:[id number] DOB: Apr 27, 1946 DOA: 07/10/2014 PCP: Wendie Agreste, MD  HPI/Recap of past 7 hours: 68 year-old white female admitted for shortness of breath and abdominal pain found to have colitis on CT. Recently diagnosed with ischemic colitis. Echocardiogram noted grade 1 diastolic dysfunction. BNP normal. Overall patient improved with little if any diarrhea and no abdominal pain. Breathing is also much easier.  Started on diuretics as patient was heavier than she feels like is her baseline  . In last 24 hours, started having some significant midepigastric abdominal pain with few bowel sounds on exam. X-ray done and unremarkable.    Assessment/Plan: Principal Problem:   Hypoxia: Unclear etiology. No signs of COPD or pneumonia. Suspect some component to be heart failure, started diuresis. See below. Active Problems:   Unspecified essential hypertension: Stable.   Colitis: Likely ischemic. Patient did have mild leukocytosis on admission. Change IV antibiotics to by mouth. If ischemic, will improve by treating heart failure. Started having more abdominal pain today. X-ray unremarkable. Check lipase    Liver mass: Patient underwent outpatient liver biopsy. Results pending, likely back on Monday.  Acute on Chronic diastolic heart failure: BNP normal, but patient had a baseline. Started on Lasix and responded well. Weight down several pounds today.  Code Status: Full code  Family Communication: Left message with daughter  Disposition Plan: Awaiting liver biopsy results, and diuresing   Consultants:  Cardiology  Procedures:  Echocardiogram: Grade 1 diastolic dysfunction  Antibiotics:  Cipro 8/13-present  Flagyl 8/13-present  HPI/Subjective: Patient complaining of abdominal pain, midepigastric with non-radiation. Bowels moving, responding to diuretics  Objective: BP 120/78  Pulse 90  Temp(Src) 99 F (37.2 C) (Oral)  Resp 20  Ht  4\' 11"  (1.499 m)  Wt 64.093 kg (141 lb 4.8 oz)  BMI 28.52 kg/m2  SpO2 97%  Intake/Output Summary (Last 24 hours) at 07/13/14 1040 Last data filed at 07/13/14 0816  Gross per 24 hour  Intake 3651.25 ml  Output   3051 ml  Net 600.25 ml   Filed Weights   07/11/14 0518 07/12/14 0700 07/13/14 0531  Weight: 67.2 kg (148 lb 2.4 oz) 67.2 kg (148 lb 2.4 oz) 64.093 kg (141 lb 4.8 oz)    Exam:   General:  Alert and oriented x3, in moderate distress from abdominal pain  Cardiovascular: Regular rate and rhythm, S1-S2  Respiratory: Clear to auscultation bilaterally  Abdomen: Soft, mildly distended, tender but with no guarding-mostly midepigastric, few bowel sounds   Musculoskeletal: No clubbing or cyanosis, trace edema   Data Reviewed: Basic Metabolic Panel:  Recent Labs Lab 07/10/14 2100 07/11/14 0920 07/12/14 0320 07/13/14 0530  NA 140 135* 138 136*  K 3.6* 4.3 3.5* 3.4*  CL 101 99 103 101  CO2 23 24 23 25   GLUCOSE 158* 216* 116* 127*  BUN 18 13 11 9   CREATININE 0.83 0.67 0.66 0.66  CALCIUM 8.5 8.5 8.6 8.6   Liver Function Tests:  Recent Labs Lab 07/10/14 2100 07/11/14 0920 07/12/14 0320  AST 72* 109* 65*  ALT 41* 61* 47*  ALKPHOS 232* 289* 243*  BILITOT 1.2 1.3* 0.8  PROT 6.9 6.3 5.8*  ALBUMIN 3.0* 2.7* 2.5*    Recent Labs Lab 07/10/14 2100  LIPASE 69*   No results found for this basename: AMMONIA,  in the last 168 hours CBC:  Recent Labs Lab 07/10/14 0825 07/10/14 2100 07/11/14 0920 07/12/14 0320 07/13/14 1002  WBC 12.9* 12.4* 8.0 6.4 9.5  NEUTROABS  --  8.9* 7.0  --   --   HGB 11.7* 11.7* 11.0* 10.4* 11.8*  HCT 35.6* 36.1 34.2* 32.2* 36.7  MCV 96.0 96.8 97.7 97.3 98.9  PLT 327 322 275 243 315   Cardiac Enzymes:   No results found for this basename: CKTOTAL, CKMB, CKMBINDEX, TROPONINI,  in the last 168 hours BNP (last 3 results)  Recent Labs  07/11/14 0920  PROBNP 30.2   CBG: No results found for this basename: GLUCAP,  in the last  168 hours  Recent Results (from the past 240 hour(s))  CLOSTRIDIUM DIFFICILE BY PCR     Status: None   Collection Time    07/12/14  7:15 AM      Result Value Ref Range Status   C difficile by pcr NEGATIVE  NEGATIVE Final     Studies: No results found.  Scheduled Meds: . amLODipine  10 mg Oral Daily  . ciprofloxacin  500 mg Oral BID  . enoxaparin (LOVENOX) injection  40 mg Subcutaneous Q24H  . estradiol  0.05 mg Transdermal Weekly  . furosemide  20 mg Intravenous Q12H  . lisinopril  20 mg Oral Daily  . metroNIDAZOLE  500 mg Oral 3 times per day  . sodium chloride  3 mL Intravenous Q12H    Continuous Infusions:    Time spent: 25 minutes  [place] [facility] Pager [phone]. If 7PM-7AM, please contact night-coverage at [email or web address], password [facility] 07/13/2014, 10:40 AM  LOS: 3 days

## 2014-07-13 NOTE — Progress Notes (Signed)
Patient c/o abdominal pain,  N/V, Zofran, 1 mg of morphine iv given. MD notified and came to the floor to see patient. Abdominal xray ordered. Patient feel better after vomiting. Will continue to monitor patient.

## 2014-07-14 LAB — BASIC METABOLIC PANEL
Anion gap: 13 (ref 5–15)
BUN: 9 mg/dL (ref 6–23)
CO2: 25 mEq/L (ref 19–32)
Calcium: 9.3 mg/dL (ref 8.4–10.5)
Chloride: 99 mEq/L (ref 96–112)
Creatinine, Ser: 0.65 mg/dL (ref 0.50–1.10)
GFR calc Af Amer: >90 mL/min (ref >90)
GFR, EST NON AFRICAN AMERICAN: 89 mL/min — AB (ref >90)
Glucose, Bld: 104 mg/dL — ABNORMAL HIGH (ref 70–99)
POTASSIUM: 3.7 meq/L (ref 3.7–5.3)
SODIUM: 137 meq/L (ref 137–147)

## 2014-07-14 MED ORDER — BARIUM SULFATE 2.1 % PO SUSP
900.0000 mL | ORAL | Status: AC
  Administered 2014-07-14: 900 mL via ORAL

## 2014-07-14 NOTE — Progress Notes (Signed)
CARE MANAGEMENT NOTE 07/14/2014  Patient:  Brianna Duncan, Brianna Duncan   Account Number:  [id number]  Date Initiated:  07/14/2014  Documentation initiated by:  [facility]  Subjective/Objective Assessment:   Hypoxia     Action/Plan:   waiting final dc recommendations   Anticipated DC Date:     Anticipated DC Plan:  [place]  CM consult      Choice offered to / List presented to:             Status of service:  Completed, signed off Medicare Important Message given?  YES (If response is "NO", the following Medicare IM given date fields will be blank) Date Medicare IM given:  07/14/2014 Medicare IM given by:  [facility] Date Additional Medicare IM given:   Additional Medicare IM given by:    Discharge Disposition:    Per UR Regulation:    If discussed at Long Length of Stay Meetings, dates discussed:    Comments:

## 2014-07-14 NOTE — Progress Notes (Signed)
PROGRESS NOTE  Brianna Duncan PRF:[id number] DOB: 1946/05/06 DOA: 07/10/2014 PCP: Wendie Agreste, MD  HPI/Recap of past 61 hours: 68 year-old white female admitted for shortness of breath and abdominal pain found to have colitis on CT. 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  Workup for shortness of breath negative for pneumonia, but Echocardiogram noted grade 1 diastolic dysfunction. BNP normal.   Started on diuretics as patient was heavier than she feels like is her baseline. Started on antibiotics for her colitis, although in the last 24-36 hours, abdominal pain worse intermittently  Patient doing okay. Abdominal pain still persisting. Responding well to diuresis and breathing much more comfortable.  Assessment/Plan: Principal Problem:   Hypoxia: Unclear etiology. No signs of COPD or pneumonia. Suspect some component to be heart failure, started diuresis. See below. Active Problems:   Unspecified essential hypertension: Stable.   Colitis: Likely ischemic. Patient did have mild leukocytosis on admission. Continued on antibiotics. If ischemic, will improve by treating heart failure. With persisting abdominal pain, x-ray done which was unremarkable. Lipase unremarkable. Will check repeat CT scan    Liver mass: Patient underwent outpatient liver biopsy on Thursday 8/13. As of Monday afternoon, results still pending  Acute on Chronic diastolic heart failure: BNP normal, but patient feels weight is up from her baseline. Started on Lasix and responded well. Rate has since decreased by 10 pounds  Code Status: Full code  Family Communication: Left message with daughter  Disposition Plan: Home once fully diuresed, colitis/abdominal pain improved. Waiting for liver biopsy results.   Consultants: None  Procedures:  Echocardiogram: Grade 1 diastolic dysfunction  Antibiotics:  Cipro 8/13-present  Flagyl 8/13-present  HPI/Subjective: Patient still has some  abdominal pain intermittently. Pretty much more comfortably  Objective: BP 100/59  Pulse 86  Temp(Src) 98.5 F (36.9 C) (Oral)  Resp 20  Ht 4\' 11"  (1.499 m)  Wt 62.6 kg (138 lb 0.1 oz)  BMI 27.86 kg/m2  SpO2 98%  Intake/Output Summary (Last 24 hours) at 07/14/14 1730 Last data filed at 07/14/14 1500  Gross per 24 hour  Intake   1120 ml  Output   3225 ml  Net  -2105 ml   Filed Weights   07/12/14 0700 07/13/14 0531 07/14/14 0418  Weight: 67.2 kg (148 lb 2.4 oz) 64.093 kg (141 lb 4.8 oz) 62.6 kg (138 lb 0.1 oz)    Exam:   General:  Alert and oriented x3, no acute distress  Cardiovascular: Regular rate and rhythm, S1-S2  Respiratory: Clear to auscultation bilaterally  Abdomen: Soft, mildly distended, tender but with no guarding-mostly midepigastric, moderate bowel sounds  Musculoskeletal: No clubbing or cyanosis, trace edema   Data Reviewed: Basic Metabolic Panel:  Recent Labs Lab 07/10/14 2100 07/11/14 0920 07/12/14 0320 07/13/14 0530 07/14/14 0400  NA 140 135* 138 136* 137  K 3.6* 4.3 3.5* 3.4* 3.7  CL 101 99 103 101 99  CO2 23 24 23 25 25   GLUCOSE 158* 216* 116* 127* 104*  BUN 18 13 11 9 9   CREATININE 0.83 0.67 0.66 0.66 0.65  CALCIUM 8.5 8.5 8.6 8.6 9.3   Liver Function Tests:  Recent Labs Lab 07/10/14 2100 07/11/14 0920 07/12/14 0320  AST 72* 109* 65*  ALT 41* 61* 47*  ALKPHOS 232* 289* 243*  BILITOT 1.2 1.3* 0.8  PROT 6.9 6.3 5.8*  ALBUMIN 3.0* 2.7* 2.5*    Recent Labs Lab 07/10/14 2100 07/13/14 0530  LIPASE 69* 34   No results found for this  basename: AMMONIA,  in the last 168 hours CBC:  Recent Labs Lab 07/10/14 0825 07/10/14 2100 07/11/14 0920 07/12/14 0320 07/13/14 1002  WBC 12.9* 12.4* 8.0 6.4 9.5  NEUTROABS  --  8.9* 7.0  --   --   HGB 11.7* 11.7* 11.0* 10.4* 11.8*  HCT 35.6* 36.1 34.2* 32.2* 36.7  MCV 96.0 96.8 97.7 97.3 98.9  PLT 327 322 275 243 315   Cardiac Enzymes:   No results found for this basename:  CKTOTAL, CKMB, CKMBINDEX, TROPONINI,  in the last 168 hours BNP (last 3 results)  Recent Labs  07/11/14 0920  PROBNP 30.2   CBG: No results found for this basename: GLUCAP,  in the last 168 hours  Recent Results (from the past 240 hour(s))  CLOSTRIDIUM DIFFICILE BY PCR     Status: None   Collection Time    07/12/14  7:15 AM      Result Value Ref Range Status   C difficile by pcr NEGATIVE  NEGATIVE Final     Studies: Dg Abd Portable 1v  07/13/2014   CLINICAL DATA:  Abdominal pain.  Recent liver biopsy.  EXAM: PORTABLE ABDOMEN - 1 VIEW  COMPARISON:  CT, 07/10/2014  FINDINGS: Residual oral contrast from the prior CT is within a nondistended colon. There is no bowel dilation to suggest obstruction. Left lower pole intrarenal calcifications seen. Soft tissues are otherwise unremarkable. No gross free air on this supine study.  IMPRESSION: No acute findings.   Electronically Signed   By: Lajean Manes M.D.   On: 07/13/2014 10:20    Scheduled Meds: . amLODipine  10 mg Oral Daily  . ciprofloxacin  500 mg Oral BID  . enoxaparin (LOVENOX) injection  40 mg Subcutaneous Q24H  . estradiol  0.05 mg Transdermal Weekly  . furosemide  20 mg Intravenous Q12H  . lisinopril  20 mg Oral Daily  . metroNIDAZOLE  500 mg Oral 3 times per day  . sodium chloride  3 mL Intravenous Q12H    Continuous Infusions:    Time spent: 15 minutes  [place] [facility] Pager [phone]. If 7PM-7AM, please contact night-coverage at [email or web address], password [facility] 07/14/2014, 5:30 PM  LOS: 4 days

## 2014-07-15 ENCOUNTER — Encounter (HOSPITAL_COMMUNITY): Payer: Self-pay

## 2014-07-15 ENCOUNTER — Inpatient Hospital Stay (HOSPITAL_COMMUNITY): Payer: Medicare Other

## 2014-07-15 LAB — BASIC METABOLIC PANEL
Anion gap: 14 (ref 5–15)
BUN: 13 mg/dL (ref 6–23)
CHLORIDE: 97 meq/L (ref 96–112)
CO2: 26 mEq/L (ref 19–32)
Calcium: 9.3 mg/dL (ref 8.4–10.5)
Creatinine, Ser: 0.79 mg/dL (ref 0.50–1.10)
GFR, EST NON AFRICAN AMERICAN: 84 mL/min — AB (ref >90)
Glucose, Bld: 107 mg/dL — ABNORMAL HIGH (ref 70–99)
Potassium: 3.6 mEq/L — ABNORMAL LOW (ref 3.7–5.3)
SODIUM: 137 meq/L (ref 137–147)

## 2014-07-15 MED ORDER — OXYCODONE-ACETAMINOPHEN 5-325 MG PO TABS: 2.0000 | ORAL_TABLET | ORAL | Status: DC | PRN

## 2014-07-15 MED ORDER — METRONIDAZOLE 500 MG PO TABS: 500.0000 mg | ORAL_TABLET | Freq: Three times a day (TID) | ORAL | Status: DC

## 2014-07-15 MED ORDER — LEVOFLOXACIN 500 MG PO TABS: 500.0000 mg | ORAL_TABLET | Freq: Every day | ORAL | Status: DC

## 2014-07-15 MED ORDER — ONDANSETRON 4 MG PO TBDP: ORAL_TABLET | ORAL | Status: DC

## 2014-07-15 MED ORDER — ALPRAZOLAM 0.25 MG PO TABS
0.2500 mg | ORAL_TABLET | Freq: Once | ORAL | Status: AC
  Administered 2014-07-15: 0.25 mg via ORAL
  Filled 2014-07-15: qty 1

## 2014-07-15 NOTE — Discharge Summary (Signed)
Physician Discharge Summary  Brianna Duncan GUY:[id number] DOB: 1946-06-13 DOA: 07/10/2014  PCP: Wendie Agreste, MD  Admit date: 07/10/2014 Discharge date: 07/15/2014  Time spent: >35 minutes  Recommendations for Outpatient Follow-up:  F/u with GI in 3-5 days  F/u with Pulmonologist in 1-2 week  F/u with PCP in 1 week   Discharge Diagnoses:  Principal Problem:   Acute diastolic heart failure Active Problems:   Unspecified essential hypertension   Hypoxia   Colitis   Liver mass   Discharge Condition: stable   Diet recommendation: heart heathy   Filed Weights   07/13/14 0531 07/14/14 0418 07/15/14 0647  Weight: 64.093 kg (141 lb 4.8 oz) 62.6 kg (138 lb 0.1 oz) 62.506 kg (137 lb 12.8 oz)    History of present illness:  68 year-old white female admitted for shortness of breath and abdominal pain found to have colitis on CT. 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  -Patient recently underwent liver biopsy for ? Cholangiocarcinoma (pend biopsy results);   Hospital Course:  1. Hypoxia: Unclear etiology. No signs of COPD or pneumonia, but h/o smoking recent CT: Stable anterior basal segment left lower lobe consolidation from the recent CT Abdomen and Pelvis. Superimposed bilateral pulmonary atelectasis. -symptoms resolved; no wheezing, afebrile; no cough  -d/w patient recommended to f/u with pulmonologist evaluation in 1-2 week  -echo: LVEF 56-38%, grade 1 diastolic dysfunction, BnP WNL; clinically euvolemic   2. Hypertension: Stable. Cont home regimen  3. Colitis: suspected ischemic. Patient did have mild leukocytosis on admission.  -resolved on atx; no s/s acute abdomen, no s/s of cholecystitis; cotn PO atx; f/u with GI next week   4. Liver mass ? Cholangiocarcinoma; Pt underwent outpatient liver biopsy on Thursday 8/13. results still pending  -repeat CT abd : Cirrhotic changes involving the liver and possible left hepatic lobe lesions. High attenuation  material the gallbladder likely a combination of sludge, stones and vicarious excretion.  No acute abdominal/pelvic findings or adenopathy. -no s/s of cholecystitis;  -Pt has seen GI recently, underwent colonoscopy; d/w patient suspicious for underlying CA; recommended to f/u with GI in 3-5 days   Procedures: Echo:  Study Conclusions  - Left ventricle: The cavity size was normal. Wall thickness was increased in a pattern of mild LVH. There was mild concentric hypertrophy. Systolic function was normal. The estimated ejection fraction was in the range of 55% to 60%. Wall motion was normal; there were no regional wall motion abnormalities. Doppler parameters are consistent with abnormal left ventricular relaxation (grade 1 diastolic dysfunction). - Mitral valve: Calcified annulus.  Impressions:  - Normal pulmonary artery pressure.   (i.e. Studies not automatically included, echos, thoracentesis, etc; not x-rays)  Consultations:  none  Discharge Exam: Filed Vitals:   07/15/14 0647  BP:   Pulse: 95  Temp: 98.6 F (37 C)  Resp:     General: alert Cardiovascular: s1,s2 rrr Respiratory: CTA BL  Discharge Instructions  Discharge Instructions   Diet - low sodium heart healthy    Complete by:  As directed      Discharge instructions    Complete by:  As directed   Please follow up with primary care doctor in 1 week  Please follow up with gastroenterologist in 3-5 days  Please follow up with pulmonologist in 1-2 week     Increase activity slowly    Complete by:  As directed             Medication List  ALPRAZolam 0.5 MG tablet  Commonly known as:  XANAX  TAKE ONE TABLET BY MOUTH AT BEDTIME AS NEEDED FOR ANXIETY     amLODipine 10 MG tablet  Commonly known as:  NORVASC  Take 10 mg by mouth daily.     estradiol 0.05 MG/24HR patch  Commonly known as:  VIVELLE-DOT  Place 1 patch onto the skin 2 (two) times a week.     fluticasone 50 MCG/ACT nasal spray   Commonly known as:  FLONASE  Place 1 spray into both nostrils daily as needed for allergies or rhinitis.     levofloxacin 500 MG tablet  Commonly known as:  LEVAQUIN  Take 1 tablet (500 mg total) by mouth daily.     lisinopril-hydrochlorothiazide 20-12.5 MG per tablet  Commonly known as:  PRINZIDE,ZESTORETIC  Take 1 tablet by mouth daily.     metroNIDAZOLE 500 MG tablet  Commonly known as:  FLAGYL  Take 1 tablet (500 mg total) by mouth 3 (three) times daily. One po bid x 7 days     ondansetron 4 MG disintegrating tablet  Commonly known as:  ZOFRAN ODT  4mg  ODT q4 hours prn nausea/vomit     oxyCODONE-acetaminophen 5-325 MG per tablet  Commonly known as:  PERCOCET  Take 2 tablets by mouth every 4 (four) hours as needed.       Allergies  Allergen Reactions  . Ivp Dye [Iodinated Diagnostic Agents] Hives       Follow-up Information   Call MEDOFF,JEFFREY R, MD. (Call in the morning to discuss ED visit and close follow up)    Specialty:  Gastroenterology   Contact information:   [place] [address] [phone]       Follow up with [facility]. (If symptoms worsen including worsening pain, fever, passing out or lightheadedness)    Specialty:  Emergency Medicine   Contact information:   [address] [id number] [place] [address] [phone]      Follow up with Wendie Agreste, MD. Schedule an appointment as soon as possible for a visit in 3 days.   Specialties:  Family Medicine, Sports Medicine   Contact information:   [address] [place] [address] [phone]        The results of significant diagnostics from this hospitalization (including imaging, microbiology, ancillary and laboratory) are listed below for reference.    Significant Diagnostic Studies: Ct Abdomen Pelvis Wo Contrast  07/15/2014   CLINICAL DATA:  Abdominal pain.  EXAM: CT ABDOMEN AND PELVIS WITHOUT  CONTRAST  TECHNIQUE: Multidetector CT imaging of the abdomen and pelvis was performed following the standard protocol without IV contrast.  COMPARISON:  07/10/2014  FINDINGS: The lung bases are grossly clear despite motion artifact.  Stable cirrhotic changes involving the liver. Suspected lesions in the left hepatic lobe are not well delineated. High attenuation material in the gallbladder could be a combination of stones, sludge and vicarious excretion. Mild stable common bile duct dilatation. The pancreas is grossly normal. Mild atrophy. The spleen is normal in size. No focal lesions. The adrenal glands and kidneys are unremarkable except for a stable lower pole left renal calculus. No obstructing ureteral calculi or bladder calculi.  The stomach, duodenum, small bowel and colon are unremarkable. No inflammatory changes, mass lesions or obstructive findings. No mesenteric or retroperitoneal mass or adenopathy. The aorta demonstrates stable atherosclerotic calcifications.  The bladder is unremarkable. The uterus is surgically absent. No pelvic mass or free pelvic  fluid collections. No inguinal mass or adenopathy.  IMPRESSION: 1. Cirrhotic changes involving the liver and possible left hepatic lobe lesions. 2. High attenuation material the gallbladder likely a combination of sludge, stones and vicarious excretion. 3. No acute abdominal/pelvic findings or adenopathy. 4. Stable left lower pole renal calculus.   Electronically Signed   By: Kalman Jewels M.D.   On: 07/15/2014 02:20   Ct Abdomen Pelvis Wo Contrast  07/11/2014   CLINICAL DATA:  Upper abdominal pain. Status post liver biopsy today.  EXAM: CT ABDOMEN AND PELVIS WITHOUT CONTRAST  TECHNIQUE: Multidetector CT imaging of the abdomen and pelvis was performed following the standard protocol without IV contrast.  COMPARISON:  CT of the abdomen and pelvis 07/09/2014.  FINDINGS: Lung Bases: Mild dependent atelectasis in the lower lobes of the lungs bilaterally.  Atherosclerotic calcifications in the right coronary artery. Calcifications of the mitral annulus.  Abdomen/Pelvis: Focal contour abnormalities in the left lobe of the liver corresponding to suspected mass (see MRI of the liver 07/02/2014). Heterogeneous hypoattenuation throughout the hepatic parenchyma, compatible with heterogeneous hepatic steatosis. Some focal fatty sparing adjacent to the gallbladder fossa is noted. Small calcified gallstones are noted within the dependent portion of the gallbladder. No findings to suggest acute cholecystitis at this time. No intrahepatic or perihepatic high attenuation fluid collection to suggest post biopsy hemorrhage. The unenhanced appearance of the pancreas, spleen and bilateral adrenal glands is unremarkable. Multiple nonobstructive calculi are noted within the collecting systems of the kidneys bilaterally, largest of which measures 9 mm in the lower pole collecting system of the left kidney. No ureteral stones or findings of urinary tract obstruction are noted at this time.  There is some diffuse colonic wall thickening throughout the sigmoid colon with some subtle surrounding haziness in the mesocolon fat and hypervascularity, particularly in the proximal sigmoid colon, concerning for an acute colitis. Normal appendix. No significant volume of ascites. No pneumoperitoneum. No pathologic distention of small bowel. No definite lymphadenopathy identified in the abdomen or pelvis on today's non contrast CT examination. Status post hysterectomy. Ovaries are not confidently identified may be surgically absent or atrophic. Urinary bladder is normal in appearance.  Musculoskeletal: There are no aggressive appearing lytic or blastic lesions noted in the visualized portions of the skeleton.  IMPRESSION: 1. No intrahepatic or perihepatic high attenuation fluid collection to suggest significant post biopsy hemorrhage. 2. Extensive colonic wall thickening with surrounding  inflammatory changes, most notable in the proximal sigmoid colon, compatible with an acute colitis. 3. Cholelithiasis without evidence to suggest acute cholecystitis at this time. 4. Normal appendix. 5. Multiple nonobstructive calculi within the collecting systems of the kidneys bilaterally, largest of which measures 9 mm in the lower pole collecting system of the left kidney. No ureteral stones or findings of urinary tract obstruction are noted at this time. 6. Extensive atherosclerosis, including right coronary artery disease. Please note that although the presence of coronary artery calcium documents the presence of coronary artery disease, the severity of this disease and any potential stenosis cannot be assessed on this non-gated CT examination. Assessment for potential risk factor modification, dietary therapy or pharmacologic therapy may be warranted, if clinically indicated. 7. Abnormal appearance of the left lobe of the liver corresponding to suspected mass based on prior MRI 07/02/2014. Correlation with forthcoming results of today's liver biopsy is recommended. 8. Hepatic steatosis.   Electronically Signed   By: Vinnie Langton M.D.   On: 07/11/2014 00:32   Ct Abdomen Pelvis Wo Contrast  06/17/2014   CLINICAL DATA:  Vomiting with diarrhea and chills.  Bloody stool.  EXAM: CT ABDOMEN AND PELVIS WITHOUT CONTRAST  TECHNIQUE: Multidetector CT imaging of the abdomen and pelvis was performed following the standard protocol without IV contrast.  COMPARISON:  05/11/2011 CT urogram.  FINDINGS: Lung bases: Clear. No pleural or pericardial effusion. Coronary artery calcifications and a small hiatal hernia are noted.  Liver: There is new diffuse hepatic steatosis. There is new volume loss within the left hepatic lobe. Within the contracted left hepatic lobe, there is central increased density. There is suspicion of underlying low-density lesions measuring 13 mm and 8 mm on image 24. In addition, there is a 1.2 cm  low-density lesion posteriorly in the dome of the right hepatic lobe on image 20.  Gallbladder/Biliary system: There is dependent high-density bile within the gallbladder lumen as well as a small calcified gallstone. There is no gallbladder wall thickening. There is mild extrahepatic biliary dilatation.  Pancreas: Unremarkable.  Spleen: Normal in size without focal abnormality.  Adrenal glands: No adrenal mass.  Kidneys/Ureters/Bladder: There are small nonobstructing renal calculi bilaterally, the largest in the lower pole of the left kidney. There is no evidence of hydronephrosis, ureteral or bladder calculus. There is no perinephric soft tissue stranding or other focal renal abnormality.  Bowel: The stomach and small bowel appear unremarkable. There is eccentric wall thickening within the cecum and right colon. There is also focal eccentric wall thickening in the mid transverse colon (axial image 50). Several other areas of nodular bowel wall thickening are suspected in the colon. The appendix appears normal. There is no evidence of bowel obstruction.  Peritoneum: No ascites or peritoneal nodularity.  Lymph Nodes: There are small ileocolonic mesenteric lymph nodes.  Vascular Structures: There is atherosclerosis of the aorta, its branches and the iliac arteries. There is some distortion of the portal and hepatic veins without occlusion or definite intravascular tumor.  Reproductive Organs: The uterus is surgically absent. There is no adnexal mass.  Abdominal wall: There is a small umbilical hernia. There is also a small amount of fluid in the right groin medial to the femoral vessels, suspicious for fluid within a small hernia.  Musculoskeletal: Degenerative changes are present throughout the lower lumbar spine. There is a chronic appearing superior endplate Schmorl's node at T8. No worrisome osseous findings demonstrated.  IMPRESSION: 1. Interval change in the appearance of the liver with contraction of the left  lobe and multiple low density lesions. Findings are worrisome for malignancy, possibly metastatic disease given the associated colonic findings. Alternatively, the findings could be due to multicentric hepatocellular carcinoma. 2. New multicentric irregular colonic wall thickening, primarily within the right and transverse colon. Appearance is not typical for multifocal colitis, and colon cancer is suspected. Endoscopic evaluation recommended. No evidence of bowel obstruction or perforation. 3. Nonobstructing bilateral renal calculi.   Electronically Signed   By: Camie Patience M.D.   On: 06/17/2014 21:11   Dg Chest 2 View  07/11/2014   CLINICAL DATA:  Hypoxemia.  History of smoking.  EXAM: CHEST  2 VIEW  COMPARISON:  Chest radiograph performed 04/11/2014  FINDINGS: The lungs are well-aerated and clear. There is no evidence of focal opacification, pleural effusion or pneumothorax.  The heart is borderline normal in size; the mediastinal contour is within normal limits. No acute osseous abnormalities are seen.  IMPRESSION: No acute cardiopulmonary process seen.   Electronically Signed   By: Garald Balding M.D.   On: 07/11/2014 03:33  Ct Angio Chest Pe W/cm &/or Wo Cm  07/11/2014   CLINICAL DATA:  68 year old female status post liver mass biopsy, hypoxia, possible abdominal ischemia. Initial encounter.  EXAM: CT ANGIOGRAPHY CHEST WITH CONTRAST  TECHNIQUE: Multidetector CT imaging of the chest was performed using the standard protocol during bolus administration of intravenous contrast. Multiplanar CT image reconstructions and MIPs were obtained to evaluate the vascular anatomy.  CONTRAST:  17mL OMNIPAQUE IOHEXOL 350 MG/ML SOLN  COMPARISON:  Chest radiographs 0301 hr the same day and earlier. CT Abdomen and Pelvis 07/10/2014 and earlier.  FINDINGS: Good contrast bolus timing in the pulmonary arterial tree.  No focal filling defect identified in the pulmonary arterial tree to suggest the presence of acute pulmonary  embolism.  Major airways are patent except for atelectatic changes. There is dependent atelectasis in both upper and lower lobes. There is superimposed left lower lobe anterior and lateral basal segment consolidation, stable from the recent CT Abdomen and Pelvis (series 406, image 70).  Possible superimposed upper lobe centrilobular emphysema. 3 mm nodule in the anterior right middle lobe on image 45.  No pleural effusion. Cardiomegaly. No pericardial effusion. No mediastinal lymphadenopathy. Negative thoracic inlet. No axillary lymphadenopathy.  Mild calcified plaque in the visible aorta. Left coronary artery calcified plaque is evident. No mural or adherent proximal aortic thrombus identified.  Chronic mesenteric fat within a hiatal hernia. Difficult to exclude mild esophageal wall thickening. New small volume fluid or increased gastrohepatic lymph node with in the hernia on series 41, image 64 measuring 9 mm short axis. Advanced hepatic steatosis partially re - identified. No free fluid identified in the upper abdomen.  Thoracic compression fractures affecting the T4, T5, and T8 superior endplates. These appear to be chronic. Incidental benign vertebral body hemangioma in the right T2 pedicle. Overall bone mineralization within normal limits for age. No acute or suspicious osseous lesion identified.  Review of the MIP images confirms the above findings.  IMPRESSION: 1.  No evidence of acute pulmonary embolus. 2. Stable anterior basal segment left lower lobe consolidation from the recent CT Abdomen and Pelvis. Superimposed bilateral pulmonary atelectasis. 3. Anterior right middle lobe 3 mm pulmonary nodule. Recommend followup chest CT in 12 months If the patient is at high risk for bronchogenic carcinoma. This recommendation follows the consensus statement: Guidelines for Management of Small Pulmonary Nodules Detected on CT Scans: A Statement from the [employer] as published in Radiology 2005; 237:395-400.  4. Small volume increased fluid or increased gastrohepatic lymph node within the chronic hiatal mesenteric hernia. Hepatic steatosis re - identified. 5. Mild for age calcified plaque in the visible aorta.   Electronically Signed   By: Lars Pinks M.D.   On: 07/11/2014 07:22   Mr Abdomen W Wo Contrast  07/02/2014   CLINICAL DATA:  Liver mass on prior exam  EXAM: MRI ABDOMEN WITHOUT AND WITH CONTRAST  TECHNIQUE: Multiplanar multisequence MR imaging of the abdomen was performed both before and after the administration of intravenous contrast.  CONTRAST:  6 cc Eovist IV contrast  COMPARISON:  CT abdomen/ pelvis 06/17/2014 at [facility]: There is asymmetric atrophy of the lateral segment left hepatic lobe with abnormal internal T2 weighted hyperintensity, surrounding mild intrahepatic ductal dilatation, and abnormal central enhancement spanning the medial and lateral segment of the left hepatic lobe in a confluent fashion, measuring 6.9 x 3.9 cm. Within this abnormal area of T2 weighted signal intensity and enhancement, there is a rim enhancing 1.4 cm  lesion in the lateral segment left hepatic lobe for example image 16/series 17. Gallstones, sub cm renal cortical cysts, and sub cm hepatic cysts are incidentally noted. Adrenal glands, spleen, and pancreas are unremarkable. Allowing for technique, the previously seen area of apparent right colonic wall thickening is not re-evaluated due to motion. No ascites or lymphadenopathy. Diffuse drop in hepatic signal is compatible with steatosis.  IMPRESSION: Findings highly suggestive for malignancy such as primary hepatic cholangiocarcinoma spanning the lateral and medial segments of the left hepatic lobe, although metastasis with desmoplastic reaction could appear similar.  Hepatic steatosis.  Hepatic and renal cortical cysts.  The colon is not specifically re- evaluated compared to the prior exam, due to inherent differences of MRI technique.  Colonoscopy is again recommended based on previous exam findings.   Electronically Signed   By: Conchita Paris M.D.   On: 07/02/2014 16:05   US Biopsy  07/10/2014   CLINICAL DATA:  Left lobe liver lesion  EXAM: ULTRASOUND-GUIDED BIOPSY OF A LEFT LOBE LIVER LESION.  CORE.  MEDICATIONS AND MEDICAL HISTORY: Versed 2 mg, Fentanyl 100 mcg.  ANESTHESIA/SEDATION: Moderate sedation time: 5 minutes  PROCEDURE: The procedure, risks, benefits, and alternatives were explained to the patient. Questions regarding the procedure were encouraged and answered. The patient understands and consents to the procedure.  The epigastrium was prepped with Betadine in a sterile fashion, and a sterile drape was applied covering the operative field. A sterile gown and sterile gloves were used for the procedure.  Under sonographic guidance, an 17gauge guide needle was advanced into the left lobe liver lesion. Subsequently 4 18 gauge core biopsies were obtained. Gel-Foam slurry was injected into the tract. Final imaging was performed.  Patient tolerated the procedure well without complication. Vital sign monitoring by nursing staff during the procedure will continue as patient is in the special procedures unit for post procedure observation.  FINDINGS: The images document guide needle placement within the left lobe liver lesion. Post biopsy images demonstrate no hemorrhage.  IMPRESSION: Successful ultrasound-guided core biopsy of a left lobe liver lesion.   Electronically Signed   By: Maryclare Bean M.D.   On: 07/10/2014 17:12   Dg Abd Portable 1v  07/13/2014   CLINICAL DATA:  Abdominal pain.  Recent liver biopsy.  EXAM: PORTABLE ABDOMEN - 1 VIEW  COMPARISON:  CT, 07/10/2014  FINDINGS: Residual oral contrast from the prior CT is within a nondistended colon. There is no bowel dilation to suggest obstruction. Left lower pole intrarenal calcifications seen. Soft tissues are otherwise unremarkable. No gross free air on this supine study.   IMPRESSION: No acute findings.   Electronically Signed   By: Lajean Manes M.D.   On: 07/13/2014 10:20   Ct Angio Abd/pel W/ And/or W/o  07/09/2014   ADDENDUM REPORT: 07/09/2014 18:38  ADDENDUM: Not mentioned in the impressions is common duct dilatation, without cause identified. There is a periampullary duodenal diverticulum which could be partially positive. Correlation with bilirubin levels and consideration of ERCP suggested.   Electronically Signed   By: Abigail Miyamoto M.D.   On: 07/09/2014 18:38   07/09/2014   CLINICAL DATA:  Concern for bowel ischemia after abnormal colonoscopy. Decreased appetite. Followup of liver lesions.  EXAM: CTA ABDOMEN AND PELVIS wITHOUT AND WITH CONTRAST  TECHNIQUE: Multidetector CT imaging of the abdomen and pelvis was performed using the standard protocol during bolus administration of intravenous contrast. Multiplanar reconstructed images and MIPs were obtained and reviewed to evaluate the vascular anatomy.  CONTRAST:  148mL OMNIPAQUE IOHEXOL 350 MG/ML SOLN  COMPARISON:  MRI of 07/02/2014.  CT of 06/17/2014 and 05/11/2011.  FINDINGS: Lower Chest: Clear lung bases. Normal heart size without pericardial or pleural effusion. Multivessel coronary artery atherosclerosis.  Abdomen/Pelvis: Hepatic steatosis, moderate. Hepatic dome vague hyper enhancement on image 172/series 4 is similar to on the prior MRI.  Fat sparing with superimposed Portal venous phase ill-defined hypo enhancement traversing the medial and lateral segment left liver lobes. Measures 4.5 x 4.7 cm on image 181/ series 3. Concurrent atrophy and architectural distortion within the surrounding left lobe. The intrahepatic biliary ductal dilatation detailed on the prior MRI is not readily apparent. Somewhat more focal focus of hypoattenuation in the left lateral segment measures 11 mm on image 185/series 4 and corresponds to the abnormality on prior MRI.  Patent hepatic veins.  The portal veins are patent centrally.   Normal spleen, stomach.  Periampullary duodenal diverticulum. Normal gallbladder. Cholelithiasis on prior MRI not well visualized CT.  Common duct is dilated for age, 11 mm on coronal image 58. No pancreatic ductal dilatation or evidence of choledocholithiasis.  Normal adrenal glands.  Bilateral renal cysts.  The celiac axis and superior mesenteric artery are both widely patent. Mildly age advanced aortic and branch vessel atherosclerosis. No significant stenosis at the origin of the renal arteries. The inferior mesenteric artery is patent.  No retroperitoneal or retrocrural adenopathy.  Underdistended sigmoid colon on image 224. Colonic stool burden suggests constipation. The previously described areas of colonic wall thickening are not identified. Normal terminal ileum and appendix.  No small bowel wall thickening to suggest enteritis. No bowel obstruction, ascites, or pneumatosis.  Moderate atherosclerosis involving the pelvic vasculature, without significant stenosis or aneurysm. Both internal iliac arteries are patent.  No pelvic adenopathy. Contrast within the urinary bladder. Hysterectomy. No adnexal mass or significant free fluid.  Bones/Musculoskeletal: No acute osseous abnormality. 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  Review of the MIP images confirms the above findings.  IMPRESSION: 1. Aortic and branch vessel atherosclerosis, without significant stenosis or evidence of bowel ischemia. 2. Dominant left hepatic lobe area of abnormal enhancement with concurrent atrophy and anatomic distortion. Similar in appearance to the recent MRI. Considerations include cholangiocarcinoma or, if the patient has otherwise occult cirrhosis, an area of focal confluent hepatic fibrosis. Tissue sampling should be considered. A hepatic dome focus of vague hyper enhancement is indeterminate and can be re-evaluated on follow-up. 3. The areas of colonic wall thickening have  resolved and were likely due to colitis. Possible constipation.  Electronically Signed: By: Abigail Miyamoto M.D. On: 07/09/2014 17:29    Microbiology: Recent Results (from the past 240 hour(s))  CLOSTRIDIUM DIFFICILE BY PCR     Status: None   Collection Time    07/12/14  7:15 AM      Result Value Ref Range Status   C difficile by pcr NEGATIVE  NEGATIVE Final     Labs: Basic Metabolic Panel:  Recent Labs Lab 07/11/14 0920 07/12/14 0320 07/13/14 0530 07/14/14 0400 07/15/14 0430  NA 135* 138 136* 137 137  K 4.3 3.5* 3.4* 3.7 3.6*  CL 99 103 101 99 97  CO2 24 23 25 25 26   GLUCOSE 216* 116* 127* 104* 107*  BUN 13 11 9 9 13   CREATININE 0.67 0.66 0.66 0.65 0.79  CALCIUM 8.5 8.6 8.6 9.3 9.3   Liver Function Tests:  Recent Labs Lab 07/10/14 2100 07/11/14 0920 07/12/14 0320  AST  72* 109* 65*  ALT 41* 61* 47*  ALKPHOS 232* 289* 243*  BILITOT 1.2 1.3* 0.8  PROT 6.9 6.3 5.8*  ALBUMIN 3.0* 2.7* 2.5*    Recent Labs Lab 07/10/14 2100 07/13/14 0530  LIPASE 69* 34   No results found for this basename: AMMONIA,  in the last 168 hours CBC:  Recent Labs Lab 07/10/14 0825 07/10/14 2100 07/11/14 0920 07/12/14 0320 07/13/14 1002  WBC 12.9* 12.4* 8.0 6.4 9.5  NEUTROABS  --  8.9* 7.0  --   --   HGB 11.7* 11.7* 11.0* 10.4* 11.8*  HCT 35.6* 36.1 34.2* 32.2* 36.7  MCV 96.0 96.8 97.7 97.3 98.9  PLT 327 322 275 243 315   Cardiac Enzymes: No results found for this basename: CKTOTAL, CKMB, CKMBINDEX, TROPONINI,  in the last 168 hours BNP: BNP (last 3 results)  Recent Labs  07/11/14 0920  PROBNP 30.2   CBG: No results found for this basename: GLUCAP,  in the last 168 hours     Signed:  Kinnie Feil  [facility] 07/15/2014, 8:43 AM

## 2014-07-15 NOTE — Progress Notes (Signed)
Discharge instructions given to patient and daughter. Verbalizes understanding of medications.

## 2014-07-18 ENCOUNTER — Ambulatory Visit (INDEPENDENT_AMBULATORY_CARE_PROVIDER_SITE_OTHER): Payer: Medicare Other | Admitting: Family Medicine

## 2014-07-18 VITALS — BP 102/58 | HR 88 | Temp 97.7°F | Resp 16 | Ht 59.25 in | Wt 140.4 lb

## 2014-07-18 DIAGNOSIS — F411 Generalized anxiety disorder: Secondary | ICD-10-CM

## 2014-07-18 DIAGNOSIS — R160 Hepatomegaly, not elsewhere classified: Secondary | ICD-10-CM

## 2014-07-18 DIAGNOSIS — K5289 Other specified noninfective gastroenteritis and colitis: Secondary | ICD-10-CM

## 2014-07-18 DIAGNOSIS — R0902 Hypoxemia: Secondary | ICD-10-CM

## 2014-07-18 DIAGNOSIS — F418 Other specified anxiety disorders: Secondary | ICD-10-CM

## 2014-07-18 DIAGNOSIS — I10 Essential (primary) hypertension: Secondary | ICD-10-CM

## 2014-07-18 DIAGNOSIS — K769 Liver disease, unspecified: Secondary | ICD-10-CM

## 2014-07-18 MED ORDER — ALPRAZOLAM 0.5 MG PO TABS: 0.5000 mg | ORAL_TABLET | Freq: Two times a day (BID) | ORAL | Status: DC | PRN

## 2014-07-18 MED ORDER — AMLODIPINE BESYLATE 10 MG PO TABS: 10.0000 mg | ORAL_TABLET | Freq: Every day | ORAL | Status: DC

## 2014-07-18 MED ORDER — LISINOPRIL-HYDROCHLOROTHIAZIDE 20-12.5 MG PO TABS: 1.0000 | ORAL_TABLET | Freq: Every day | ORAL | Status: DC

## 2014-07-18 NOTE — Patient Instructions (Signed)
Keep follow up with Dr. Earlean Shawl and pulmonary as scheduled. Liver biopsy results still pending. Xanax up to twice per day - let me know if the worry or anxiety is worsening.  If any new abdominal pain, vomiting, fever, or other new or worsening symptoms - return here or emergency room.

## 2014-07-18 NOTE — Progress Notes (Addendum)
Subjective:    Patient ID: Brianna Duncan, female    DOB: Dec 31, 1945, 68 y.o.   MRN: [id number] This chart was scribed for Brianna Ray, MD by Cathie Hoops, ED Scribe. The patient was seen in Room 4. The patient's care was started at 10:43 AM.   07/18/2014  HPI Janella Rogala Standre is a 68 y.o. female Wendie Agreste, MD  Chief Complaint  Patient presents with  . Hospitalization Follow-up    HTN Pt reports she has about one week's supply of medication left. Pt denies new side effects with her medication. Pt reports she has not seen Dr. Einar Gip since leaving the hospital. Pt denies Dr. Einar Gip knew about her abdominal pain.  Hypoxia  Pt reports she has an unknown object on pulmonologist on 9/1. Pt denies having to go home on oxygen. Pt denies SOB.   Abdominal Pain Pt reports she has an appointment with Dr. Earlean Shawl on 8/26. Pt denies nausea, vomiting and abdominal pain since leaving the hospital.  Colitis Pt reports when she last went, she was told her stomach is great. She ahd a CT Angio of the abdomen on 8/7 with aortic and branch vessel atherosclerosis without significant stenosis: IMPRESSION:  1. Aortic and branch vessel atherosclerosis, without significant  stenosis or evidence of bowel ischemia.  2. Dominant left hepatic lobe area of abnormal enhancement with  concurrent atrophy and anatomic distortion. Similar in appearance to  the recent MRI. Considerations include cholangiocarcinoma or, if the  patient has otherwise occult cirrhosis, an area of focal confluent  hepatic fibrosis. Tissue sampling should be considered. A hepatic  dome focus of vague hyper enhancement is indeterminate and can be  re-evaluated on follow-up.  3. The areas of colonic wall thickening have resolved and were  likely due to colitis. Possible constipation sis or bowel ischemia:   Liver Pt reports she had the biopsy of the liver. Pt denies she spoke with the provider about the concerns for  her liver. Pt will see the provider on 8/26.   Colonoscpy Pt reports after her WBC was elevated, her WBC decreased while she was in the hospital.  Pt reports her family is doing well. Pt reports she is doing well. Pt does report she is worried about her medical issues. Pt states she is taking some Xanax 0.5 mg. Pt would like to increase to 2 pills of Xanax just in case she needs it during the day. Pt denies wanting to talk to a counselor about her current medical issues.    Health Maintenance Here for hospital follow-up. Last seen by me on 6/15 with plan of f/u in 2-3 weeks. Seen in the ED on 7/21 with complaint of rectal bleeding along with nausea, vomiting, diarrhea and abdominal cramping. CT scan obtained with several lesions in the liver and small lesions within the colon that were concerning for possible metastatic colon cancer. She was sent to Dr. Earlean Shawl. Office visit on 8/7. Summary per his visit: CT of the abdomen showed irregular colonic wall thickening within the right and transverse colon. Suspicious for colon cancer and appearance of liver suspicious for Hepatic maligancy involving the lateral and medial segments of the left hepatic lobe by the MRI on 8/5. Colonoscopy was attempted on 8/3 but was limited to a sigmoidoscopy because of acute ischemic colitis with a stricture. Per Dr Liliane Channel notes suspected 2 different disease processes occurring. First being possible liver malignancy. Second process ischemic colitis. MR-angiogram pending.   Liver Liver biopsy obtained on 8/13.  Ultrasound guided. She was seen on the ED later that day for upper abdominal pain. She had a CT of abdomen. Noted for acute colitis but no post-biopsy hemorrhage. Plan to start on Cipro and Flagyl but was noted to be hypoxic ABG. Admitted to the hospitalist. Hospitalized 8/13-8/15. Liver mass. Liver biopsy still pending.  Hypoxia Hypoxia unclear ideology without signs of COPD or active pneumonia. She did have a  stable anterior basal segment left lower lobe consolidation from a recent CT and superimposed atelectasis. Symptoms have resolved in the hospital without wheezing or cough. Check back in 1-2 weeks. Had an echocardiogram with an EF of 55-60% and normal BNP.  Colitis Colitis suspected ischemic. She was treated with Cipro and Flagyl. Improved. Plan on follow-up with GI, next week.   Review of Systems  Constitutional: Negative for fever, chills, fatigue and unexpected weight change.  Respiratory: Negative for chest tightness and shortness of breath.   Cardiovascular: Negative for chest pain, palpitations and leg swelling.  Gastrointestinal: Negative for nausea, vomiting, abdominal pain and blood in stool.  Neurological: Negative for dizziness, syncope, light-headedness and headaches.  All other systems reviewed and are negative.  Past Medical History  Diagnosis Date  . Hypertension   . Allergy   . Anxiety   . Osteoporosis    Past Surgical History  Procedure Laterality Date  . Abdominal hysterectomy    . Foot surgery      right foot, left great toe and left second toe surgery   Allergies  Allergen Reactions  . Ivp Dye [Iodinated Diagnostic Agents] Hives   Current Outpatient Prescriptions  Medication Sig Dispense Refill  . ALPRAZolam (XANAX) 0.5 MG tablet TAKE ONE TABLET BY MOUTH AT BEDTIME AS NEEDED FOR ANXIETY  30 tablet  0  . amLODipine (NORVASC) 10 MG tablet Take 10 mg by mouth daily.      Marland Kitchen estradiol (VIVELLE-DOT) 0.05 MG/24HR patch Place 1 patch onto the skin 2 (two) times a week.      . fluticasone (FLONASE) 50 MCG/ACT nasal spray Place 1 spray into both nostrils daily as needed for allergies or rhinitis.      Marland Kitchen levofloxacin (LEVAQUIN) 500 MG tablet Take 1 tablet (500 mg total) by mouth daily.  5 tablet  0  . lisinopril-hydrochlorothiazide (PRINZIDE,ZESTORETIC) 20-12.5 MG per tablet Take 1 tablet by mouth daily.      . metroNIDAZOLE (FLAGYL) 500 MG tablet Take 1 tablet (500 mg  total) by mouth 3 (three) times daily. One po bid x 7 days  15 tablet  0  . ondansetron (ZOFRAN ODT) 4 MG disintegrating tablet 4mg  ODT q4 hours prn nausea/vomit  15 tablet  0  . oxyCODONE-acetaminophen (PERCOCET) 5-325 MG per tablet Take 2 tablets by mouth every 4 (four) hours as needed.  15 tablet  0   No current facility-administered medications for this visit.   Objective:  Physical Exam  Nursing note and vitals reviewed. Constitutional: She is oriented to person, place, and time. She appears well-developed and well-nourished.  HENT:  Head: Normocephalic and atraumatic.  Right Ear: External ear normal.  Left Ear: External ear normal.  Nose: Nose normal.  Mouth/Throat: Oropharynx is clear and moist.  Eyes: Conjunctivae and EOM are normal. Pupils are equal, round, and reactive to light.  Neck: Normal range of motion. Neck supple.  Cardiovascular: Normal rate, regular rhythm, normal heart sounds and intact distal pulses.   Pulmonary/Chest: Effort normal and breath sounds normal.  Abdominal: Soft. Bowel sounds are normal. She exhibits  no distension. There is no tenderness.  Musculoskeletal: Normal range of motion.  No lower extremity edema.  Neurological: She is alert and oriented to person, place, and time. She has normal reflexes.  Skin: Skin is warm and dry.  Psychiatric: She has a normal mood and affect. Her behavior is normal. Judgment and thought content normal.     Filed Vitals:   07/18/14 0853  BP: 102/58  Pulse: 88  Temp: 97.7 F (36.5 C)  TempSrc: Oral  Resp: 16  Height: 4' 11.25" (1.505 m)  Weight: 140 lb 6.4 oz (63.685 kg)  SpO2: 98%    Assessment & Plan:  10:53 AM- Patient informed of current plan for treatment and evaluation and agrees with plan at this time.  Faryn Sieg Dowdle is a 68 y.o. female Essential hypertension - Plan: amLODipine (NORVASC) 10 MG tablet, lisinopril-hydrochlorothiazide (PRINZIDE,ZESTORETIC) 20-12.5 MG per tablet  -stable. meds  refilled, but will have her check home BP's - if on low side, may need to cut back on med doses.  Lab Results  Component Value Date   CREATININE 0.79 07/15/2014   CREATININE 0.65 07/14/2014   CREATININE 0.66 07/13/2014   Other and unspecified noninfectious gastroenteritis and colitis(558.9)  -initially concerning for ischemic colitis. Improved now. Will monitro home BP and make sure not overtreated, to lessen chance of "low flow state" than could contribute to ischemic colitis. Follow up with GI as planned.   Liver mass  -possible hepatocellular CA - biopsy pending and follow up planned with GI. Denies abd pain in office.   Situational anxiety - Plan: ALPRAZolam (XANAX) 0.5 MG tablet  - declines counseling as recommended in past, and she feels she is doing ok even with recent developments. Ok to continue xanax 1/2 -1 of 0.5mg  BID prn, but SED. Encouraged to consider counseling if needed - numbers provided prior. Plan to follow up in next month.   Hypoxia  -in hospital. asx at present. No apparent cause during eval inpatient. Has pulmonary follow up planned.   ER/RTC precautions discussed.   Meds ordered this encounter  Medications  . ALPRAZolam (XANAX) 0.5 MG tablet    Sig: Take 1 tablet (0.5 mg total) by mouth 2 (two) times daily as needed for anxiety.    Dispense:  30 tablet    Refill:  1  . amLODipine (NORVASC) 10 MG tablet    Sig: Take 1 tablet (10 mg total) by mouth daily.    Dispense:  90 tablet    Refill:  1  . lisinopril-hydrochlorothiazide (PRINZIDE,ZESTORETIC) 20-12.5 MG per tablet    Sig: Take 1 tablet by mouth daily.    Dispense:  90 tablet    Refill:  1   Patient Instructions  Keep follow up with Dr. Earlean Shawl and pulmonary as scheduled. Liver biopsy results still pending. Xanax up to twice per day - let me know if the worry or anxiety is worsening.  If any new abdominal pain, vomiting, fever, or other new or worsening symptoms - return here or emergency room.       I personally performed the services described in this documentation, which was scribed in my presence. The recorded information has been reviewed and considered, and addended by me as needed.

## 2014-07-20 ENCOUNTER — Telehealth: Payer: Self-pay | Admitting: Family Medicine

## 2014-07-20 NOTE — Telephone Encounter (Signed)
Please call patient re: last ov and a few other things I wanted to mention:  I gave her a new Rx for Xanax 0.5mg  to reflect twice daily dosing if needed. She should start with 1/2 tablet due to possible sedation and other side effects with this medicine and only take the other half tablet if no relief with initial 1/2 tablet.  She was also prescribed this a week before, so not sure if she had filled prior Rx, but should only have one supply of this at home.   Also, with her colitis she had experienced with hospitalization, this can sometimes be from a low bloodflow state in the body, such as dehydration, or too low of blood pressure. Her BP at OV on Friday was on the lower side. Please have her check her BP outside the office once or twice each day and if remaining below 110 on upper number, we will need to decrease the strength of her medicines. Have her call in with these readings in the next 2-3 days.   Let me know if there are any questions. Thanks - [employer]

## 2014-07-21 NOTE — Telephone Encounter (Signed)
Patient called back to speak with [name]. Advised patient that [name] has already left for the evening and she will return in the AM.

## 2014-07-21 NOTE — Telephone Encounter (Signed)
Patient returning call to Brianna Bolus LPN, please call patient at [phone]. Patient aware it will be later today, after 6pm

## 2014-07-21 NOTE — Telephone Encounter (Signed)
LM for rtn call on home number.

## 2014-07-22 NOTE — Telephone Encounter (Signed)
Spoke to pt- read to her the note from Dr. Carlota Raspberry, [place] verbalized an understanding. She will monitor bp and call if consistently lower than 127 systolic. She will keep a journal for the next week and call us with her BP's in one week as long as it stays above 110.

## 2014-07-24 ENCOUNTER — Encounter (HOSPITAL_COMMUNITY): Payer: Self-pay

## 2014-07-29 ENCOUNTER — Ambulatory Visit (INDEPENDENT_AMBULATORY_CARE_PROVIDER_SITE_OTHER)
Admission: RE | Admit: 2014-07-29 | Discharge: 2014-07-29 | Disposition: A | Discharge status: Home / Self Care | Payer: Medicare Other | Source: Ambulatory Visit | Attending: Emergency Medicine | Admitting: Emergency Medicine

## 2014-07-29 ENCOUNTER — Telehealth: Payer: Self-pay | Admitting: *Deleted

## 2014-07-29 ENCOUNTER — Ambulatory Visit (INDEPENDENT_AMBULATORY_CARE_PROVIDER_SITE_OTHER): Payer: Medicare Other | Admitting: Emergency Medicine

## 2014-07-29 ENCOUNTER — Encounter: Payer: Self-pay | Admitting: Emergency Medicine

## 2014-07-29 VITALS — BP 98/70 | HR 110 | Ht 59.0 in | Wt 137.0 lb

## 2014-07-29 DIAGNOSIS — R0902 Hypoxemia: Secondary | ICD-10-CM

## 2014-07-29 NOTE — Assessment & Plan Note (Addendum)
suspect due to atx and abdominal non-compliance while she was hospitalized, superimposed on poorly characterized COPD. She denies dyspnea, but she has had bronchitis / PNA before (consistent with AE's). Her hypoxemia has resolved.  - CXR today; if evidence for persistent infiltrates then will repeat her CT scan  - full PFT to better assess for underlying COPD - walking oximetry reassuring.  - rov next available.

## 2014-07-29 NOTE — Patient Instructions (Signed)
Walking oximetry today We will perform full pulmonary function testing Chest x-ray today Follow with Dr. Lamonte Sakai next available with full PFT

## 2014-07-29 NOTE — Telephone Encounter (Signed)
Pt called in to report her BP for the past week- please refer to last telephone encounter.   8/26   110/70 p 90 8/27   111/63 p 103 8/28   90/58 p 83 8/29   143/53 p105 8/30   86/52 p104 9/1     98/53 p 88  Pt has continued taking Lisinopril/HCTZ and Amlodipine as prescribed. She wants to find out about any changes that need to be made- she has had some diarrhea but over all is feeling well.

## 2014-07-29 NOTE — Progress Notes (Signed)
Subjective:    Patient ID: Brianna Duncan, female    DOB: [birth year], 68 y.o.   MRN: [id number]  HPI 68 yo woman, former smoker (10 pk-yrs), was admitted August '15 for colitis, noted to have liver lesions, colonic lesions worrisome for malignancy. Liver bx and csy have not been diagnostic so far. She was found to be hypoxemia. CT-PA without any evidence for PE, multilobar basilar atx. She is not now and was not SOB while admitted.    Review of Systems  Constitutional: Negative for fever and unexpected weight change.  HENT: Negative for congestion, dental problem, ear pain, nosebleeds, postnasal drip, rhinorrhea, sinus pressure, sneezing, sore throat and trouble swallowing.   Eyes: Negative for redness and itching.  Respiratory: Negative for cough, chest tightness, shortness of breath and wheezing.   Cardiovascular: Negative for palpitations and leg swelling.  Gastrointestinal: Negative for nausea and vomiting.  Genitourinary: Negative for dysuria.  Musculoskeletal: Negative for joint swelling.  Skin: Negative for rash.  Neurological: Negative for headaches.  Hematological: Does not bruise/bleed easily.  Psychiatric/Behavioral: Negative for dysphoric mood. The patient is not nervous/anxious.    Past Medical History  Diagnosis Date  . Hypertension   . Allergy   . Anxiety   . Osteoporosis      Family History  Problem Relation Age of Onset  . Heart disease Mother   . Stroke Father   . Stroke Sister   . Heart disease Brother      History   Social History  . Marital Status: Divorced    Spouse Name: N/A    Number of Children: 1  . Years of Education: N/A   Occupational History  . retired     retired   Social History Main Topics  . Smoking status: Former Smoker -- 0.75 packs/day for 44 years    Types: Cigarettes    Quit date: 11/29/1999  . Smokeless tobacco: Never Used     Comment: 68  yrs ago  . Alcohol Use: 0.6 oz/week    1 Glasses of wine per week   Comment: wine with dinner and socially  . Drug Use: No  . Sexual Activity: Yes    Birth Control/ Protection: None   Other Topics Concern  . Not on file   Social History Narrative   Patient lives at home with her sister and she is retired.   Education some college.   Right handed.   Caffeine three cups of coffee daily.     Allergies  Allergen Reactions  . Ivp Dye [Iodinated Diagnostic Agents] Hives     Outpatient Prescriptions Prior to Visit  Medication Sig Dispense Refill  . ALPRAZolam (XANAX) 0.5 MG tablet Take 1 tablet (0.5 mg total) by mouth 2 (two) times daily as needed for anxiety.  30 tablet  1  . amLODipine (NORVASC) 10 MG tablet Take 1 tablet (10 mg total) by mouth daily.  90 tablet  1  . estradiol (VIVELLE-DOT) 0.05 MG/24HR patch Place 1 patch onto the skin 2 (two) times a week.      . fluticasone (FLONASE) 50 MCG/ACT nasal spray Place 1 spray into both nostrils daily as needed for allergies or rhinitis.      Marland Kitchen lisinopril-hydrochlorothiazide (PRINZIDE,ZESTORETIC) 20-12.5 MG per tablet Take 1 tablet by mouth daily.  90 tablet  1  . ondansetron (ZOFRAN ODT) 4 MG disintegrating tablet 4mg  ODT q4 hours prn nausea/vomit  15 tablet  0  . oxyCODONE-acetaminophen (PERCOCET) 5-325 MG per tablet Take  2 tablets by mouth every 4 (four) hours as needed.  15 tablet  0  . levofloxacin (LEVAQUIN) 500 MG tablet Take 1 tablet (500 mg total) by mouth daily.  5 tablet  0  . metroNIDAZOLE (FLAGYL) 500 MG tablet Take 1 tablet (500 mg total) by mouth 3 (three) times daily. One po bid x 7 days  15 tablet  0   No facility-administered medications prior to visit.         Objective:   Physical Exam Filed Vitals:   07/29/14 1640  BP: 98/70  Pulse: 110  Height: 4\' 11"  (1.499 m)  Weight: 137 lb (62.143 kg)  SpO2: 94%   Gen: Pleasant, well-nourished, in no distress,  normal affect  ENT: No lesions,  mouth clear,  oropharynx clear, no postnasal drip  Neck: No JVD, no TMG, no carotid  bruits  Lungs: No use of accessory muscles, clear without rales or rhonchi  Cardiovascular: RRR, heart sounds normal, no murmur or gallops, no peripheral edema  Musculoskeletal: some OA changes on hands, no cyanosis or clubbing  Neuro: alert, non focal  Skin: Warm, no lesions or rashes    07/11/14 --  COMPARISON: Chest radiographs 0301 hr the same day and earlier. CT  Abdomen and Pelvis 07/10/2014 and earlier.  FINDINGS:  Good contrast bolus timing in the pulmonary arterial tree.  No focal filling defect identified in the pulmonary arterial tree to  suggest the presence of acute pulmonary embolism.  Major airways are patent except for atelectatic changes. There is  dependent atelectasis in both upper and lower lobes. There is  superimposed left lower lobe anterior and lateral basal segment  consolidation, stable from the recent CT Abdomen and Pelvis (series  406, image 70).  Possible superimposed upper lobe centrilobular emphysema. 3 mm  nodule in the anterior right middle lobe on image 45.  No pleural effusion. Cardiomegaly. No pericardial effusion. No  mediastinal lymphadenopathy. Negative thoracic inlet. No axillary  lymphadenopathy.  Mild calcified plaque in the visible aorta. Left coronary artery  calcified plaque is evident. No mural or adherent proximal aortic  thrombus identified.  Chronic mesenteric fat within a hiatal hernia. Difficult to exclude  mild esophageal wall thickening. New small volume fluid or increased  gastrohepatic lymph node with in the hernia on series 41, image 64  measuring 9 mm short axis. Advanced hepatic steatosis partially re -  identified. No free fluid identified in the upper abdomen.  Thoracic compression fractures affecting the T4, T5, and T8 superior  endplates. These appear to be chronic. Incidental benign vertebral  body hemangioma in the right T2 pedicle. Overall bone mineralization  within normal limits for age. No acute or suspicious  osseous lesion  identified.  Review of the MIP images confirms the above findings.  IMPRESSION:  1. No evidence of acute pulmonary embolus.  2. Stable anterior basal segment left lower lobe consolidation from  the recent CT Abdomen and Pelvis. Superimposed bilateral pulmonary  atelectasis.  3. Anterior right middle lobe 3 mm pulmonary nodule. Recommend  followup chest CT in 12 months If the patient is at high risk for  bronchogenic carcinoma. This recommendation follows the consensus  statement: Guidelines for Management of Small Pulmonary Nodules  Detected on CT Scans: A Statement from the [employer] as  published in Radiology 2005; 237:395-400.  4. Small volume increased fluid or increased gastrohepatic lymph  node within the chronic hiatal mesenteric hernia. Hepatic steatosis  re - identified.  5. Mild for age  calcified plaque in the visible aorta.      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 She denies dyspnea, but she has had bronchitis / PNA before (consistent with AE's). Her hypoxemia has resolved.  - CXR today; if evidence for persistent infiltrates then will repeat her CT scan  - full PFT to better assess for underlying COPD - walking oximetry reassuring.  - rov next available.

## 2014-07-29 NOTE — Telephone Encounter (Signed)
Other than one isolated reading of 071 systolic, her BP's are running too low.  Cut back to 1/2 of amlodipine (totoal dose of 5mg  each day), and call with readings over next week.  If lightheadedness, dizziness or other worsening sx's sooner - rtc or ER.

## 2014-07-29 NOTE — Addendum Note (Signed)
Addended by: Maurice March on: 07/29/2014 05:19 PM   Modules accepted: Orders

## 2014-07-31 NOTE — Telephone Encounter (Signed)
Pt called back. Pt understands directions to take 1/2 amlodipine. Pt will call in 1 week to report BP's again. Advised pt to RTC or ED if any symptoms. Pt verbalized an understanding.

## 2014-08-05 ENCOUNTER — Telehealth: Payer: Self-pay | Admitting: Emergency Medicine

## 2014-08-05 NOTE — Telephone Encounter (Signed)
Notes Recorded by Collene Gobble, MD on 07/30/2014 at 10:01 AM Please inform pt that her CXR is normal.  LMTCB x 1

## 2014-08-05 NOTE — Telephone Encounter (Signed)
Pt returned call. Informed pt of results per RB. Pt verbalized understanding and denied any further questions or concerns at this time.

## 2014-08-05 NOTE — Progress Notes (Signed)
Quick Note:  Pt returned call. Informed pt of results per RB. Pt verbalized understanding and denied any further questions or concerns at this time. ______

## 2014-08-07 ENCOUNTER — Ambulatory Visit
Admission: RE | Admit: 2014-08-07 | Discharge: 2014-08-07 | Disposition: A | Discharge status: Home / Self Care | Payer: Medicare Other | Source: Ambulatory Visit | Attending: Gastroenterology | Admitting: Gastroenterology

## 2014-08-07 ENCOUNTER — Other Ambulatory Visit: Payer: Self-pay | Admitting: Family Medicine

## 2014-08-07 ENCOUNTER — Other Ambulatory Visit: Payer: Self-pay | Admitting: Gastroenterology

## 2014-08-07 DIAGNOSIS — R109 Unspecified abdominal pain: Secondary | ICD-10-CM

## 2014-08-07 NOTE — Telephone Encounter (Signed)
Blood pressure readings for Dr. Carlota Raspberry  Dates unknown, time around 1:30   88/54 103 101/59 106 95/63 110 86/56 101 98/57 98  [phone]

## 2014-08-08 NOTE — Telephone Encounter (Signed)
Dr Carlota Raspberry, see BP readings below. It looks like pt is a little early requesting more RFs on her alprazolam.

## 2014-08-09 NOTE — Telephone Encounter (Signed)
Still running too low on BP. Call patient - can stop amlodipine all together at this point and check home BP readings.  Return for repeat ov in 1 week with these readings.  It looks like this is an early refill of alprazolam.  How often is she using these?

## 2014-08-10 ENCOUNTER — Emergency Department (HOSPITAL_COMMUNITY): Payer: Medicare Other

## 2014-08-10 ENCOUNTER — Encounter (HOSPITAL_COMMUNITY): Payer: Self-pay | Admitting: Emergency Medicine

## 2014-08-10 ENCOUNTER — Inpatient Hospital Stay (HOSPITAL_COMMUNITY)
Admission: EM | Admit: 2014-08-10 | Discharge: 2014-08-25 | DRG: 329 | Disposition: A | Discharge status: Home Health | Payer: Medicare Other | Attending: Internal Medicine | Admitting: Internal Medicine

## 2014-08-10 DIAGNOSIS — K7460 Unspecified cirrhosis of liver: Secondary | ICD-10-CM | POA: Diagnosis present

## 2014-08-10 DIAGNOSIS — K631 Perforation of intestine (nontraumatic): Principal | ICD-10-CM | POA: Diagnosis present

## 2014-08-10 DIAGNOSIS — Z91041 Radiographic dye allergy status: Secondary | ICD-10-CM

## 2014-08-10 DIAGNOSIS — Z87891 Personal history of nicotine dependence: Secondary | ICD-10-CM

## 2014-08-10 DIAGNOSIS — K659 Peritonitis, unspecified: Secondary | ICD-10-CM | POA: Diagnosis present

## 2014-08-10 DIAGNOSIS — R160 Hepatomegaly, not elsewhere classified: Secondary | ICD-10-CM | POA: Diagnosis present

## 2014-08-10 DIAGNOSIS — E876 Hypokalemia: Secondary | ICD-10-CM | POA: Diagnosis not present

## 2014-08-10 DIAGNOSIS — R1033 Periumbilical pain: Secondary | ICD-10-CM | POA: Diagnosis not present

## 2014-08-10 DIAGNOSIS — R6510 Systemic inflammatory response syndrome (SIRS) of non-infectious origin without acute organ dysfunction: Secondary | ICD-10-CM | POA: Diagnosis present

## 2014-08-10 DIAGNOSIS — Z23 Encounter for immunization: Secondary | ICD-10-CM

## 2014-08-10 DIAGNOSIS — I10 Essential (primary) hypertension: Secondary | ICD-10-CM | POA: Diagnosis present

## 2014-08-10 DIAGNOSIS — K55059 Acute (reversible) ischemia of intestine, part and extent unspecified: Secondary | ICD-10-CM | POA: Diagnosis present

## 2014-08-10 DIAGNOSIS — Z823 Family history of stroke: Secondary | ICD-10-CM

## 2014-08-10 DIAGNOSIS — Z8249 Family history of ischemic heart disease and other diseases of the circulatory system: Secondary | ICD-10-CM

## 2014-08-10 DIAGNOSIS — I5032 Chronic diastolic (congestive) heart failure: Secondary | ICD-10-CM | POA: Diagnosis present

## 2014-08-10 DIAGNOSIS — F411 Generalized anxiety disorder: Secondary | ICD-10-CM | POA: Diagnosis present

## 2014-08-10 DIAGNOSIS — K551 Chronic vascular disorders of intestine: Secondary | ICD-10-CM | POA: Diagnosis present

## 2014-08-10 DIAGNOSIS — I509 Heart failure, unspecified: Secondary | ICD-10-CM | POA: Diagnosis present

## 2014-08-10 DIAGNOSIS — Z7982 Long term (current) use of aspirin: Secondary | ICD-10-CM

## 2014-08-10 DIAGNOSIS — K560 Paralytic ileus: Secondary | ICD-10-CM | POA: Diagnosis not present

## 2014-08-10 DIAGNOSIS — R1030 Lower abdominal pain, unspecified: Secondary | ICD-10-CM

## 2014-08-10 DIAGNOSIS — M810 Age-related osteoporosis without current pathological fracture: Secondary | ICD-10-CM | POA: Diagnosis present

## 2014-08-10 DIAGNOSIS — K529 Noninfective gastroenteritis and colitis, unspecified: Secondary | ICD-10-CM

## 2014-08-10 HISTORY — DX: Hormone replacement therapy: Z79.890

## 2014-08-10 HISTORY — DX: Personal history of urinary calculi: Z87.442

## 2014-08-10 LAB — URINALYSIS, ROUTINE W REFLEX MICROSCOPIC
BILIRUBIN URINE: NEGATIVE
Glucose, UA: NEGATIVE mg/dL
HGB URINE DIPSTICK: NEGATIVE
KETONES UR: 15 mg/dL — AB
Leukocytes, UA: NEGATIVE
Nitrite: NEGATIVE
Protein, ur: NEGATIVE mg/dL
SPECIFIC GRAVITY, URINE: 1.012 (ref 1.005–1.030)
UROBILINOGEN UA: 0.2 mg/dL (ref 0.0–1.0)
pH: 6 (ref 5.0–8.0)

## 2014-08-10 LAB — CBC WITH DIFFERENTIAL/PLATELET
Basophils Absolute: 0.1 10*3/uL (ref 0.0–0.1)
Basophils Relative: 1 % (ref 0–1)
Eosinophils Absolute: 0.1 10*3/uL (ref 0.0–0.7)
Eosinophils Relative: 1 % (ref 0–5)
HCT: 40.9 % (ref 36.0–46.0)
HEMOGLOBIN: 13.6 g/dL (ref 12.0–15.0)
LYMPHS ABS: 2.5 10*3/uL (ref 0.7–4.0)
Lymphocytes Relative: 20 % (ref 12–46)
MCH: 32.2 pg (ref 26.0–34.0)
MCHC: 33.3 g/dL (ref 30.0–36.0)
MCV: 96.7 fL (ref 78.0–100.0)
Monocytes Absolute: 1.5 10*3/uL — ABNORMAL HIGH (ref 0.1–1.0)
Monocytes Relative: 12 % (ref 3–12)
NEUTROS ABS: 8.5 10*3/uL — AB (ref 1.7–7.7)
NEUTROS PCT: 67 % (ref 43–77)
Platelets: 389 10*3/uL (ref 150–400)
RBC: 4.23 MIL/uL (ref 3.87–5.11)
RDW: 14.7 % (ref 11.5–15.5)
WBC: 12.7 10*3/uL — ABNORMAL HIGH (ref 4.0–10.5)

## 2014-08-10 LAB — COMPREHENSIVE METABOLIC PANEL
ALBUMIN: 3.3 g/dL — AB (ref 3.5–5.2)
ALK PHOS: 162 U/L — AB (ref 39–117)
ALT: 17 U/L (ref 0–35)
ANION GAP: 14 (ref 5–15)
AST: 35 U/L (ref 0–37)
BILIRUBIN TOTAL: 0.4 mg/dL (ref 0.3–1.2)
BUN: 11 mg/dL (ref 6–23)
CHLORIDE: 98 meq/L (ref 96–112)
CO2: 23 meq/L (ref 19–32)
CREATININE: 1.22 mg/dL — AB (ref 0.50–1.10)
Calcium: 9.6 mg/dL (ref 8.4–10.5)
GFR, EST AFRICAN AMERICAN: 52 mL/min — AB (ref >90)
GFR, EST NON AFRICAN AMERICAN: 44 mL/min — AB (ref >90)
GLUCOSE: 113 mg/dL — AB (ref 70–99)
POTASSIUM: 4.2 meq/L (ref 3.7–5.3)
Sodium: 135 mEq/L — ABNORMAL LOW (ref 137–147)
Total Protein: 7.2 g/dL (ref 6.0–8.3)

## 2014-08-10 LAB — LIPASE, BLOOD: LIPASE: 17 U/L (ref 11–59)

## 2014-08-10 MED ORDER — HYDROMORPHONE HCL PF 1 MG/ML IJ SOLN
1.0000 mg | Freq: Once | INTRAMUSCULAR | Status: AC
  Administered 2014-08-10: 1 mg via INTRAVENOUS
  Filled 2014-08-10: qty 1

## 2014-08-10 MED ORDER — KETOROLAC TROMETHAMINE 30 MG/ML IJ SOLN
30.0000 mg | Freq: Once | INTRAMUSCULAR | Status: AC
  Administered 2014-08-10: 30 mg via INTRAVENOUS
  Filled 2014-08-10: qty 1

## 2014-08-10 MED ORDER — OXYCODONE-ACETAMINOPHEN 5-325 MG PO TABS
1.0000 | ORAL_TABLET | Freq: Once | ORAL | Status: AC
  Administered 2014-08-10: 1 via ORAL
  Filled 2014-08-10: qty 1

## 2014-08-10 MED ORDER — SODIUM CHLORIDE 0.9 % IV BOLUS (SEPSIS)
1000.0000 mL | Freq: Once | INTRAVENOUS | Status: AC
  Administered 2014-08-10: 1000 mL via INTRAVENOUS

## 2014-08-10 MED ORDER — ONDANSETRON 4 MG PO TBDP
8.0000 mg | ORAL_TABLET | Freq: Once | ORAL | Status: AC
  Administered 2014-08-10: 8 mg via ORAL
  Filled 2014-08-10: qty 2

## 2014-08-10 NOTE — ED Notes (Signed)
Pt reports lower abd pain and tenderness, pressure in rectum. Dr. Mingo Amber at bedside.

## 2014-08-10 NOTE — ED Provider Notes (Signed)
CSN: [id number]     Arrival date & time 08/10/14  1822 History   First MD Initiated Contact with Patient 08/10/14 1939     Chief Complaint  Patient presents with  . Abdominal Pain     (Consider location/radiation/quality/duration/timing/severity/associated sxs/prior Treatment) Patient is a 68 y.o. female presenting with abdominal pain. The history is provided by the patient.  Abdominal Pain Pain location:  Suprapubic Pain quality: aching and sharp   Pain radiates to:  Does not radiate Pain severity:  Moderate Onset quality:  Gradual Duration:  3 days Timing:  Constant Progression:  Worsening Chronicity:  New Context: not recent illness   Relieved by:  Nothing Worsened by:  Nothing tried Associated symptoms: diarrhea and vomiting   Associated symptoms: no chest pain, no chills, no cough, no fever and no shortness of breath     Past Medical History  Diagnosis Date  . Hypertension   . Allergy   . Anxiety   . Osteoporosis    Past Surgical History  Procedure Laterality Date  . Abdominal hysterectomy    . Foot surgery      right foot, left great toe and left second toe surgery  . Colonoscopy     Family History  Problem Relation Age of Onset  . Heart disease Mother   . Stroke Father   . Stroke Sister   . Heart disease Brother    History  Substance Use Topics  . Smoking status: Former Smoker -- 0.75 packs/day for 44 years    Types: Cigarettes    Quit date: 11/29/1999  . Smokeless tobacco: Never Used     Comment: quit 15  yrs ago  . Alcohol Use: 0.6 oz/week    1 Glasses of wine per week     Comment: wine with dinner and socially   OB History   Grav Para Term Preterm Abortions TAB SAB Ect Mult Living                 Review of Systems  Constitutional: Negative for fever and chills.  Respiratory: Negative for cough and shortness of breath.   Cardiovascular: Negative for chest pain and leg swelling.  Gastrointestinal: Positive for vomiting, abdominal pain and  diarrhea.  All other systems reviewed and are negative.     Allergies  Ivp dye  Home Medications   Prior to Admission medications   Medication Sig Start Date End Date Taking? Authorizing Provider  ALPRAZolam Duanne Moron) 0.5 MG tablet Take 1 tablet (0.5 mg total) by mouth 2 (two) times daily as needed for anxiety. 07/18/14   Wendie Agreste, MD  amLODipine (NORVASC) 10 MG tablet Take 1 tablet (10 mg total) by mouth daily. 07/18/14   Wendie Agreste, MD  aspirin 81 MG tablet Take 81 mg by mouth daily.    Historical Provider, MD  estradiol (VIVELLE-DOT) 0.05 MG/24HR patch Place 1 patch onto the skin 2 (two) times a week.    Historical Provider, MD  fluticasone (FLONASE) 50 MCG/ACT nasal spray Place 1 spray into both nostrils daily as needed for allergies or rhinitis.    Historical Provider, MD  lisinopril-hydrochlorothiazide (PRINZIDE,ZESTORETIC) 20-12.5 MG per tablet Take 1 tablet by mouth daily. 07/18/14   Wendie Agreste, MD  Multiple Vitamin (MULTIVITAMIN) capsule Take 1 capsule by mouth daily.    Historical Provider, MD  ondansetron (ZOFRAN ODT) 4 MG disintegrating tablet 4mg  ODT q4 hours prn nausea/vomit 07/15/14   Kinnie Feil, MD  oxyCODONE-acetaminophen (PERCOCET) 5-325 MG per tablet  Take 2 tablets by mouth every 4 (four) hours as needed. 07/15/14   Kinnie Feil, MD   BP 127/76  Pulse 117  Temp(Src) 98.6 F (37 C) (Oral)  Resp 20  Ht 4\' 11"  (1.499 m)  SpO2 97% Physical Exam  Nursing note and vitals reviewed. Constitutional: She is oriented to person, place, and time. She appears well-developed and well-nourished. No distress.  HENT:  Head: Normocephalic and atraumatic.  Mouth/Throat: Oropharynx is clear and moist.  Eyes: EOM are normal. Pupils are equal, round, and reactive to light.  Neck: Normal range of motion. Neck supple.  Cardiovascular: Normal rate and regular rhythm.  Exam reveals no friction rub.   No murmur heard. Pulmonary/Chest: Effort normal and breath  sounds normal. No respiratory distress. She has no wheezes. She has no rales.  Abdominal: Soft. She exhibits no distension. There is tenderness (lower abdomen). There is no rebound.  Musculoskeletal: Normal range of motion. She exhibits no edema.  Neurological: She is alert and oriented to person, place, and time.  Skin: No rash noted. She is not diaphoretic.    ED Course  Procedures (including critical care time) Labs Review Labs Reviewed  CBC WITH DIFFERENTIAL - Abnormal; Notable for the following:    WBC 12.7 (*)    Neutro Abs 8.5 (*)    Monocytes Absolute 1.5 (*)    All other components within normal limits  COMPREHENSIVE METABOLIC PANEL - Abnormal; Notable for the following:    Sodium 135 (*)    Glucose, Bld 113 (*)    Creatinine, Ser 1.22 (*)    Albumin 3.3 (*)    Alkaline Phosphatase 162 (*)    GFR calc non Af Amer 44 (*)    GFR calc Af Amer 52 (*)    All other components within normal limits  URINALYSIS, ROUTINE W REFLEX MICROSCOPIC - Abnormal; Notable for the following:    APPearance CLOUDY (*)    Ketones, ur 15 (*)    All other components within normal limits  LIPASE, BLOOD    Imaging Review Ct Abdomen Pelvis Wo Contrast  08/10/2014   CLINICAL DATA:  Lower abdominal pain and diarrhea.  EXAM: CT ABDOMEN AND PELVIS WITHOUT CONTRAST  TECHNIQUE: Multidetector CT imaging of the abdomen and pelvis was performed following the standard protocol without IV contrast.  COMPARISON:  CT of the abdomen and pelvis performed 07/14/2014, and MRI of the abdomen performed 07/02/2014  FINDINGS: The visualized lung bases are clear. Diffuse coronary artery calcifications are seen.  The suspected hepatic malignancy is difficult to characterize on noncontrast CT, spanning the lateral and medial segments of the left hepatic lobe as described on prior MRI. The spleen is unremarkable in appearance. A small stone is noted within the gallbladder. The gallbladder is otherwise unremarkable. The pancreas  and adrenal glands are within normal limits. There is mild atrophy of the pancreas.  An 8 mm stone is noted at the lower pole of the left kidney. A 1.3 cm cyst is noted at the interpole region of the right kidney. A smaller 4 mm calcification is noted at the upper pole of the right kidney. The kidneys are otherwise unremarkable in appearance. There is no definite evidence of hydronephrosis. No obstructing ureteral stones are seen.  No free fluid is identified. The small bowel is unremarkable in appearance. The stomach is within normal limits. No acute vascular abnormalities are seen. Diffuse calcification is seen along the abdominal aorta and its branches.  The appendix is diminutive and  grossly unremarkable in appearance. The cecum is flipped anteriorly. Much of the colon is filled with stool and contrast. There is marked wall thickening along the mid to distal sigmoid colon, with associated soft tissue inflammation, compatible with colitis. There is no definite evidence of perforation. Scattered diverticula are noted along the proximal sigmoid colon.  The bladder is mildly distended and grossly unremarkable. The patient is status post hysterectomy. No suspicious adnexal masses are seen. No inguinal lymphadenopathy is seen.  No acute osseous abnormalities are identified. Vacuum phenomenon and disc space narrowing noted at L5-S1, with minimal grade 1 retrolisthesis of L5 on S1 and endplate sclerotic change.  IMPRESSION: 1. Acute colitis noted involving the mid to distal sigmoid colon, with marked colonic wall thickening and associated soft tissue inflammation. No definite evidence of perforation. 2. The colon is filled with stool and contrast, to the level of the colitis, compatible with associated dysmotility. Scattered diverticula noted along the proximal sigmoid colon. 3. Suspected hepatic malignancy is not well assessed on noncontrast CT, spanning the lateral and medial segments of the left hepatic lobe as  described on prior MRI. 4. Diffuse coronary artery calcifications seen. 5. Cholelithiasis; gallbladder otherwise unremarkable. 6. Bilateral nonobstructing renal stones and small right renal cysts seen. 7. Diffuse calcification along the abdominal aorta and its branches.   Electronically Signed   By: Garald Balding M.D.   On: 08/10/2014 23:24     EKG Interpretation None      MDM   Final diagnoses:  Colitis  Lower abdominal pain    67F with hx of colitis, HTN presents with abdominal pain for several days, diarrhea, vomiting, weakness. Her GI doctor, Dr. Earlean Shawl, instructed her to come to the ED. She has a hx of liver lesions that have been biopsied, colitis. Recently admitted for hypoxia of unknown etiology. Had many bouts of abdominal pain, this is new and extremely severe. Pain in lower abdomen on exam, no guarding or rebound. Will scan with her complex hx of colitis and liver lesions. CT shows evidence of colitis. Plan for admission. I have reviewed all labs and imaging and considered them in my medical decision making.   Evelina Bucy, MD 08/10/14 [phone]

## 2014-08-10 NOTE — ED Notes (Signed)
The pt is c/o lower abd pain since Friday and she has had diarrhea for the past 1-2 months.

## 2014-08-11 DIAGNOSIS — K551 Chronic vascular disorders of intestine: Secondary | ICD-10-CM | POA: Diagnosis present

## 2014-08-11 DIAGNOSIS — Z87891 Personal history of nicotine dependence: Secondary | ICD-10-CM | POA: Diagnosis not present

## 2014-08-11 DIAGNOSIS — Z7982 Long term (current) use of aspirin: Secondary | ICD-10-CM | POA: Diagnosis not present

## 2014-08-11 DIAGNOSIS — K55059 Acute (reversible) ischemia of intestine, part and extent unspecified: Secondary | ICD-10-CM | POA: Diagnosis present

## 2014-08-11 DIAGNOSIS — F411 Generalized anxiety disorder: Secondary | ICD-10-CM | POA: Diagnosis present

## 2014-08-11 DIAGNOSIS — Z91041 Radiographic dye allergy status: Secondary | ICD-10-CM | POA: Diagnosis not present

## 2014-08-11 DIAGNOSIS — K7460 Unspecified cirrhosis of liver: Secondary | ICD-10-CM | POA: Diagnosis present

## 2014-08-11 DIAGNOSIS — I10 Essential (primary) hypertension: Secondary | ICD-10-CM

## 2014-08-11 DIAGNOSIS — R6510 Systemic inflammatory response syndrome (SIRS) of non-infectious origin without acute organ dysfunction: Secondary | ICD-10-CM | POA: Diagnosis present

## 2014-08-11 DIAGNOSIS — I509 Heart failure, unspecified: Secondary | ICD-10-CM | POA: Diagnosis present

## 2014-08-11 DIAGNOSIS — K631 Perforation of intestine (nontraumatic): Secondary | ICD-10-CM | POA: Diagnosis present

## 2014-08-11 DIAGNOSIS — R1033 Periumbilical pain: Secondary | ICD-10-CM | POA: Diagnosis present

## 2014-08-11 DIAGNOSIS — K529 Noninfective gastroenteritis and colitis, unspecified: Secondary | ICD-10-CM | POA: Diagnosis present

## 2014-08-11 DIAGNOSIS — K659 Peritonitis, unspecified: Secondary | ICD-10-CM | POA: Diagnosis present

## 2014-08-11 DIAGNOSIS — Z8249 Family history of ischemic heart disease and other diseases of the circulatory system: Secondary | ICD-10-CM | POA: Diagnosis not present

## 2014-08-11 DIAGNOSIS — I5032 Chronic diastolic (congestive) heart failure: Secondary | ICD-10-CM | POA: Diagnosis present

## 2014-08-11 DIAGNOSIS — K769 Liver disease, unspecified: Secondary | ICD-10-CM

## 2014-08-11 DIAGNOSIS — M810 Age-related osteoporosis without current pathological fracture: Secondary | ICD-10-CM | POA: Diagnosis present

## 2014-08-11 DIAGNOSIS — Z823 Family history of stroke: Secondary | ICD-10-CM | POA: Diagnosis not present

## 2014-08-11 DIAGNOSIS — Z23 Encounter for immunization: Secondary | ICD-10-CM | POA: Diagnosis not present

## 2014-08-11 DIAGNOSIS — K560 Paralytic ileus: Secondary | ICD-10-CM | POA: Diagnosis not present

## 2014-08-11 DIAGNOSIS — K5289 Other specified noninfective gastroenteritis and colitis: Secondary | ICD-10-CM

## 2014-08-11 DIAGNOSIS — E876 Hypokalemia: Secondary | ICD-10-CM | POA: Diagnosis not present

## 2014-08-11 LAB — COMPREHENSIVE METABOLIC PANEL
ALT: 13 U/L (ref 0–35)
ANION GAP: 11 (ref 5–15)
AST: 24 U/L (ref 0–37)
Albumin: 2.4 g/dL — ABNORMAL LOW (ref 3.5–5.2)
Alkaline Phosphatase: 119 U/L — ABNORMAL HIGH (ref 39–117)
BUN: 11 mg/dL (ref 6–23)
CALCIUM: 8.1 mg/dL — AB (ref 8.4–10.5)
CO2: 22 meq/L (ref 19–32)
Chloride: 101 mEq/L (ref 96–112)
Creatinine, Ser: 1.09 mg/dL (ref 0.50–1.10)
GFR calc non Af Amer: 51 mL/min — ABNORMAL LOW (ref >90)
GFR, EST AFRICAN AMERICAN: 59 mL/min — AB (ref >90)
GLUCOSE: 148 mg/dL — AB (ref 70–99)
Potassium: 4.2 mEq/L (ref 3.7–5.3)
Sodium: 134 mEq/L — ABNORMAL LOW (ref 137–147)
Total Bilirubin: 0.5 mg/dL (ref 0.3–1.2)
Total Protein: 5.6 g/dL — ABNORMAL LOW (ref 6.0–8.3)

## 2014-08-11 LAB — CLOSTRIDIUM DIFFICILE BY PCR: Toxigenic C. Difficile by PCR: NEGATIVE

## 2014-08-11 LAB — CBC
HCT: 33.6 % — ABNORMAL LOW (ref 36.0–46.0)
Hemoglobin: 11.1 g/dL — ABNORMAL LOW (ref 12.0–15.0)
MCH: 32.3 pg (ref 26.0–34.0)
MCHC: 33 g/dL (ref 30.0–36.0)
MCV: 97.7 fL (ref 78.0–100.0)
PLATELETS: 287 10*3/uL (ref 150–400)
RBC: 3.44 MIL/uL — AB (ref 3.87–5.11)
RDW: 14.9 % (ref 11.5–15.5)
WBC: 20.8 10*3/uL — ABNORMAL HIGH (ref 4.0–10.5)

## 2014-08-11 LAB — SEDIMENTATION RATE: SED RATE: 21 mm/h (ref 0–22)

## 2014-08-11 LAB — LACTIC ACID, PLASMA: LACTIC ACID, VENOUS: 1.2 mmol/L (ref 0.5–2.2)

## 2014-08-11 LAB — PROCALCITONIN: Procalcitonin: 0.64 ng/mL

## 2014-08-11 MED ORDER — AMLODIPINE BESYLATE 10 MG PO TABS
10.0000 mg | ORAL_TABLET | Freq: Every day | ORAL | Status: DC
  Administered 2014-08-11: 10 mg via ORAL
  Filled 2014-08-11 (×2): qty 1

## 2014-08-11 MED ORDER — KCL IN DEXTROSE-NACL 20-5-0.9 MEQ/L-%-% IV SOLN
INTRAVENOUS | Status: DC
  Administered 2014-08-11 (×3): via INTRAVENOUS
  Filled 2014-08-11 (×4): qty 1000

## 2014-08-11 MED ORDER — SODIUM CHLORIDE 0.9 % IV SOLN
INTRAVENOUS | Status: DC
  Administered 2014-08-11 – 2014-08-13 (×4): via INTRAVENOUS
  Administered 2014-08-14: 125 mL/h via INTRAVENOUS
  Administered 2014-08-14 – 2014-08-15 (×3): via INTRAVENOUS

## 2014-08-11 MED ORDER — HYDROMORPHONE HCL PF 1 MG/ML IJ SOLN
1.0000 mg | INTRAMUSCULAR | Status: DC | PRN
  Administered 2014-08-11 – 2014-08-13 (×12): 1 mg via INTRAVENOUS
  Filled 2014-08-11 (×12): qty 1

## 2014-08-11 MED ORDER — CIPROFLOXACIN IN D5W 400 MG/200ML IV SOLN
400.0000 mg | Freq: Two times a day (BID) | INTRAVENOUS | Status: DC
  Administered 2014-08-11: 400 mg via INTRAVENOUS
  Filled 2014-08-11 (×3): qty 200

## 2014-08-11 MED ORDER — ALPRAZOLAM 0.5 MG PO TABS
0.5000 mg | ORAL_TABLET | Freq: Two times a day (BID) | ORAL | Status: DC | PRN
  Administered 2014-08-11 – 2014-08-14 (×2): 0.5 mg via ORAL
  Filled 2014-08-11 (×2): qty 1

## 2014-08-11 MED ORDER — PIPERACILLIN-TAZOBACTAM 3.375 G IVPB
3.3750 g | Freq: Three times a day (TID) | INTRAVENOUS | Status: AC
  Administered 2014-08-11 – 2014-08-20 (×27): 3.375 g via INTRAVENOUS
  Filled 2014-08-11 (×30): qty 50

## 2014-08-11 MED ORDER — FENTANYL CITRATE 0.05 MG/ML IJ SOLN
50.0000 ug | Freq: Once | INTRAMUSCULAR | Status: AC
  Administered 2014-08-11: 50 ug via INTRAVENOUS
  Filled 2014-08-11: qty 2

## 2014-08-11 MED ORDER — LISINOPRIL-HYDROCHLOROTHIAZIDE 20-12.5 MG PO TABS: 1.0000 | ORAL_TABLET | Freq: Every day | ORAL | Status: DC

## 2014-08-11 MED ORDER — ACETAMINOPHEN 325 MG PO TABS
650.0000 mg | ORAL_TABLET | ORAL | Status: DC | PRN
  Administered 2014-08-11 – 2014-08-13 (×6): 650 mg via ORAL
  Filled 2014-08-11 (×5): qty 2

## 2014-08-11 MED ORDER — HYDROCHLOROTHIAZIDE 12.5 MG PO CAPS
12.5000 mg | ORAL_CAPSULE | Freq: Every day | ORAL | Status: DC
  Administered 2014-08-11: 12.5 mg via ORAL
  Filled 2014-08-11: qty 1

## 2014-08-11 MED ORDER — ASPIRIN EC 81 MG PO TBEC
81.0000 mg | DELAYED_RELEASE_TABLET | Freq: Every day | ORAL | Status: DC
  Administered 2014-08-11 – 2014-08-13 (×3): 81 mg via ORAL
  Filled 2014-08-11 (×4): qty 1

## 2014-08-11 MED ORDER — ONDANSETRON HCL 4 MG PO TABS: 4.0000 mg | ORAL_TABLET | Freq: Four times a day (QID) | ORAL | Status: DC | PRN

## 2014-08-11 MED ORDER — PIPERACILLIN-TAZOBACTAM 3.375 G IVPB 30 MIN
3.3750 g | Freq: Once | INTRAVENOUS | Status: AC
  Administered 2014-08-11: 3.375 g via INTRAVENOUS
  Filled 2014-08-11: qty 50

## 2014-08-11 MED ORDER — SACCHAROMYCES BOULARDII 250 MG PO CAPS
250.0000 mg | ORAL_CAPSULE | Freq: Two times a day (BID) | ORAL | Status: DC
  Administered 2014-08-11 – 2014-08-14 (×7): 250 mg via ORAL
  Filled 2014-08-11 (×9): qty 1

## 2014-08-11 MED ORDER — ENOXAPARIN SODIUM 40 MG/0.4ML ~~LOC~~ SOLN
40.0000 mg | SUBCUTANEOUS | Status: DC
  Filled 2014-08-11: qty 0.4

## 2014-08-11 MED ORDER — LISINOPRIL 20 MG PO TABS
20.0000 mg | ORAL_TABLET | Freq: Every day | ORAL | Status: DC
  Administered 2014-08-11: 20 mg via ORAL
  Filled 2014-08-11: qty 1

## 2014-08-11 MED ORDER — ONDANSETRON HCL 4 MG/2ML IJ SOLN
4.0000 mg | Freq: Four times a day (QID) | INTRAMUSCULAR | Status: DC | PRN
  Administered 2014-08-23 – 2014-08-24 (×2): 4 mg via INTRAVENOUS
  Filled 2014-08-11: qty 2

## 2014-08-11 MED ORDER — METRONIDAZOLE IN NACL 5-0.79 MG/ML-% IV SOLN
500.0000 mg | Freq: Three times a day (TID) | INTRAVENOUS | Status: DC
  Administered 2014-08-11 (×2): 500 mg via INTRAVENOUS
  Filled 2014-08-11 (×4): qty 100

## 2014-08-11 NOTE — Progress Notes (Addendum)
Patient ID: Brianna Duncan  female  ZOX:[id number]    DOB: 04-03-1946    DOA: 08/10/2014  PCP: Wendie Agreste, MD  Addendum:  Just spoke with Dr Cher Nakai who reported that patient had colonoscopy in August 2015 after she had a CT abdomen which was suspicious for colon cancer and appearance of hepatic malignancy involving the lateral and medial segments of left hepatic lobe. Colonoscopy was attempted on 8/3 however was limited to sigmoidoscopy because of acute ischemic colitis with stricture. Liver biopsy was obtained on 8/13 which came back likely cirrhosis read by pathology in [place] and [place]. Dr. Cher Nakai was planning to have another liver biopsy outpatient and also rule out any vasculitis or PAN. He had ordered vasculitis workup but results were pending. Dr Cher Nakai will fax the results to the floor.  Given her new constellation of symptoms with abdominal pain, diarrhea, fevers, Dr Cher Nakai will evaluate patient, requested for surgical consult also. - Paged surgery consult pager   Newport Wenzlick M.D. [facility] 08/11/2014, 5:27 PM  Pager: [phone]      Addendum Patient spiking fevers now, BP borderline 102/53 - Will check blood cultures, UA and cultures, lactic acid, Procalcitonin,  - Place on IV Zosyn, dc'ed cipro - Hold lisinopril, HCTZ, placed on IV fluids - [facility] called but they requested Dr Cher Nakai to be called first   Sampson Self M.D. [facility] 08/11/2014, 4:23 PM  Pager: [phone]     Assessment/Plan: Principal Problem:   Acute colitis: Patient also reports chronic history of diarrhea, follows GI closely, Dr Thana Farr, feels significantly improved from yesterday - Cdiff negative, GI pathogen panel pending - Continue IV ciprofloxacin and Flagyl, florastor - Advance diet to full liquids, continue IV fluids and pain control - Had colonoscopy which was somewhat incomplete in August 2015, repeat colonoscopy or flexible sigmoidoscopy outpatient per GI, Dr  Thana Farr  Active Problems:   Unspecified essential hypertension - Currently stable    Liver lesion - Per patient she had liver biopsy done, in August 2015, 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 - Per patient Dr.Madoff, gastroenterology has been closely following her. - Will defer to outpatient GI for further workup if needed    Chronic diastolic heart failure - Currently stable, follow closely for any fluid overload  DVT Prophylaxis: SCDs  Code Status: Full code  Family Communication:  Disposition: Hopefully tomorrow if tolerating solids  Consultants:  None  Procedures:  None  Antibiotics:  IV ciprofloxacin  IV Flagyl  Subjective: Patient seen and examined, states she's feeling significantly better, no nausea, vomiting or abdominal pain, diarrhea improved  Objective: Weight change:   Intake/Output Summary (Last 24 hours) at 08/11/14 1009 Last data filed at 08/11/14 0645  Gross per 24 hour  Intake    500 ml  Output      0 ml  Net    500 ml   Blood pressure 113/55, pulse 115, temperature 100.7 F (38.2 C), temperature source Oral, resp. rate 20, height 4\' 11"  (1.499 m), weight 61.1 kg (134 lb 11.2 oz), SpO2 92.00%.  Physical Exam: General: Alert and awake, oriented x3, not in any acute distress. CVS: S1-S2 clear, no murmur rubs or gallops Chest: clear to auscultation bilaterally, no wheezing, rales or rhonchi Abdomen: soft nontender, nondistended, normal bowel sounds  Extremities: no cyanosis, clubbing or edema noted bilaterally Neuro: Cranial nerves II-XII intact, no focal neurological deficits  Lab Results: Basic Metabolic Panel:  Recent Labs Lab 08/10/14 1915 08/11/14 0731  NA 135* 134*  K  4.2 4.2  CL 98 101  CO2 23 22  GLUCOSE 113* 148*  BUN 11 11  CREATININE 1.22* 1.09  CALCIUM 9.6 8.1*   Liver Function Tests:  Recent Labs Lab 08/10/14 1915 08/11/14 0731  AST 35 24  ALT 17 13  ALKPHOS 162* 119*  BILITOT 0.4 0.5  PROT 7.2  5.6*  ALBUMIN 3.3* 2.4*    Recent Labs Lab 08/10/14 1915  LIPASE 17   No results found for this basename: AMMONIA,  in the last 168 hours CBC:  Recent Labs Lab 08/10/14 1915 08/11/14 0731  WBC 12.7* 20.8*  NEUTROABS 8.5*  --   HGB 13.6 11.1*  HCT 40.9 33.6*  MCV 96.7 97.7  PLT 389 287   Cardiac Enzymes: No results found for this basename: CKTOTAL, CKMB, CKMBINDEX, TROPONINI,  in the last 168 hours BNP: No components found with this basename: POCBNP,  CBG: No results found for this basename: GLUCAP,  in the last 168 hours   Micro Results: Recent Results (from the past 240 hour(s))  CLOSTRIDIUM DIFFICILE BY PCR     Status: None   Collection Time    08/11/14  2:06 AM      Result Value Ref Range Status   C difficile by pcr NEGATIVE  NEGATIVE Final    Studies/Results: Ct Abdomen Pelvis Wo Contrast  08/10/2014   CLINICAL DATA:  Lower abdominal pain and diarrhea.  EXAM: CT ABDOMEN AND PELVIS WITHOUT CONTRAST  TECHNIQUE: Multidetector CT imaging of the abdomen and pelvis was performed following the standard protocol without IV contrast.  COMPARISON:  CT of the abdomen and pelvis performed 07/14/2014, and MRI of the abdomen performed 07/02/2014  FINDINGS: The visualized lung bases are clear. Diffuse coronary artery calcifications are seen.  The suspected hepatic malignancy is difficult to characterize on noncontrast CT, spanning the lateral and medial segments of the left hepatic lobe as described on prior MRI. The spleen is unremarkable in appearance. A small stone is noted within the gallbladder. The gallbladder is otherwise unremarkable. The pancreas and adrenal glands are within normal limits. There is mild atrophy of the pancreas.  An 8 mm stone is noted at the lower pole of the left kidney. A 1.3 cm cyst is noted at the interpole region of the right kidney. A smaller 4 mm calcification is noted at the upper pole of the right kidney. The kidneys are otherwise unremarkable in  appearance. There is no definite evidence of hydronephrosis. No obstructing ureteral stones are seen.  No free fluid is identified. The small bowel is unremarkable in appearance. The stomach is within normal limits. No acute vascular abnormalities are seen. Diffuse calcification is seen along the abdominal aorta and its branches.  The appendix is diminutive and grossly unremarkable in appearance. The cecum is flipped anteriorly. Much of the colon is filled with stool and contrast. There is marked wall thickening along the mid to distal sigmoid colon, with associated soft tissue inflammation, compatible with colitis. There is no definite evidence of perforation. Scattered diverticula are noted along the proximal sigmoid colon.  The bladder is mildly distended and grossly unremarkable. The patient is status post hysterectomy. No suspicious adnexal masses are seen. No inguinal lymphadenopathy is seen.  No acute osseous abnormalities are identified. Vacuum phenomenon and disc space narrowing noted at L5-S1, with minimal grade 1 retrolisthesis of L5 on S1 and endplate sclerotic change.  IMPRESSION: 1. Acute colitis noted involving the mid to distal sigmoid colon, with marked colonic wall thickening and  associated soft tissue inflammation. No definite evidence of perforation. 2. The colon is filled with stool and contrast, to the level of the colitis, compatible with associated dysmotility. Scattered diverticula noted along the proximal sigmoid colon. 3. Suspected hepatic malignancy is not well assessed on noncontrast CT, spanning the lateral and medial segments of the left hepatic lobe as described on prior MRI. 4. Diffuse coronary artery calcifications seen. 5. Cholelithiasis; gallbladder otherwise unremarkable. 6. Bilateral nonobstructing renal stones and small right renal cysts seen. 7. Diffuse calcification along the abdominal aorta and its branches.   Electronically Signed   By: Garald Balding M.D.   On: 08/10/2014  23:24   Ct Abdomen Pelvis Wo Contrast  07/15/2014   CLINICAL DATA:  Abdominal pain.  EXAM: CT ABDOMEN AND PELVIS WITHOUT CONTRAST  TECHNIQUE: Multidetector CT imaging of the abdomen and pelvis was performed following the standard protocol without IV contrast.  COMPARISON:  07/10/2014  FINDINGS: The lung bases are grossly clear despite motion artifact.  Stable cirrhotic changes involving the liver. Suspected lesions in the left hepatic lobe are not well delineated. High attenuation material in the gallbladder could be a combination of stones, sludge and vicarious excretion. Mild stable common bile duct dilatation. The pancreas is grossly normal. Mild atrophy. The spleen is normal in size. No focal lesions. The adrenal glands and kidneys are unremarkable except for a stable lower pole left renal calculus. No obstructing ureteral calculi or bladder calculi.  The stomach, duodenum, small bowel and colon are unremarkable. No inflammatory changes, mass lesions or obstructive findings. No mesenteric or retroperitoneal mass or adenopathy. The aorta demonstrates stable atherosclerotic calcifications.  The bladder is unremarkable. The uterus is surgically absent. No pelvic mass or free pelvic fluid collections. No inguinal mass or adenopathy.  IMPRESSION: 1. Cirrhotic changes involving the liver and possible left hepatic lobe lesions. 2. High attenuation material the gallbladder likely a combination of sludge, stones and vicarious excretion. 3. No acute abdominal/pelvic findings or adenopathy. 4. Stable left lower pole renal calculus.   Electronically Signed   By: Kalman Jewels M.D.   On: 07/15/2014 02:20   Dg Chest 2 View  07/30/2014   CLINICAL DATA:  Hypoxia, prior history of smoking  EXAM: CHEST  2 VIEW  COMPARISON:  Prior chest x-ray 07/11/2014  FINDINGS: Cardiac and mediastinal contours are within normal limits. Trace atherosclerotic calcification in the transverse aorta. No focal airspace consolidation, pleural  effusion, pneumothorax or suspicious pulmonary nodule. Stable mild bronchitic changes. No acute osseous abnormality. Retained oral contrast material noted in the splenic flexure of the colon.  IMPRESSION: No active cardiopulmonary disease.   Electronically Signed   By: Jacqulynn Cadet M.D.   On: 07/30/2014 08:11   Dg Abd 1 View  08/07/2014   CLINICAL DATA:  Abdominal pain  EXAM: ABDOMEN - 1 VIEW  COMPARISON:  Abdominal radiographs July 13, 2014; CT abdomen and pelvis July 14, 2014  FINDINGS: There is contrast in the colon. There is diffuse stool throughout the colon. The bowel gas pattern overall is unremarkable. No obstruction or free air is seen on this supine examination.  IMPRESSION: Diffuse stool throughout colon. Overall bowel gas pattern unremarkable.   Electronically Signed   By: Lowella Grip M.D.   On: 08/07/2014 15:54   Dg Abd Portable 1v  07/13/2014   CLINICAL DATA:  Abdominal pain.  Recent liver biopsy.  EXAM: PORTABLE ABDOMEN - 1 VIEW  COMPARISON:  CT, 07/10/2014  FINDINGS: Residual oral contrast from the prior CT  is within a nondistended colon. There is no bowel dilation to suggest obstruction. Left lower pole intrarenal calcifications seen. Soft tissues are otherwise unremarkable. No gross free air on this supine study.  IMPRESSION: No acute findings.   Electronically Signed   By: Lajean Manes M.D.   On: 07/13/2014 10:20    Medications: Scheduled Meds: . amLODipine  10 mg Oral Daily  . aspirin EC  81 mg Oral Daily  . ciprofloxacin  400 mg Intravenous Q12H  . enoxaparin (LOVENOX) injection  40 mg Subcutaneous Q24H  . lisinopril  20 mg Oral Daily   And  . hydrochlorothiazide  12.5 mg Oral Daily  . metronidazole  500 mg Intravenous Q8H  . saccharomyces boulardii  250 mg Oral BID      LOS: 1 day   Lashara Urey M.D. [facility] 08/11/2014, 10:09 AM Pager: [phone]  If 7PM-7AM, please contact night-coverage [email or web address] Password [email or web address]  **Disclaimer: This  note was dictated with voice recognition software. Similar sounding words can inadvertently be transcribed and this note may contain transcription errors which may not have been corrected upon publication of note.**

## 2014-08-11 NOTE — Consult Note (Signed)
Primary Gastroenterologist:  Dr. Earlean Shawl  Reason for Consultation:  Abdominal pain, diarrhea   HPI: Brianna Duncan is a 68 y.o. female    Past Medical History  Diagnosis Date  . Hypertension   . Allergy   . Anxiety   . Osteoporosis     Past Surgical History  Procedure Laterality Date  . Abdominal hysterectomy    . Foot surgery      right foot, left great toe and left second toe surgery  . Colonoscopy      Prior to Admission medications   Medication Sig Start Date End Date Taking? Authorizing Provider  ALPRAZolam Duanne Moron) 0.5 MG tablet Take 1 tablet (0.5 mg total) by mouth 2 (two) times daily as needed for anxiety. 07/18/14  Yes Wendie Agreste, MD  amLODipine (NORVASC) 10 MG tablet Take 1 tablet (10 mg total) by mouth daily. 07/18/14  Yes Wendie Agreste, MD  aspirin 81 MG tablet Take 81 mg by mouth daily.   Yes Historical Provider, MD  estradiol (VIVELLE-DOT) 0.05 MG/24HR patch Place 1 patch onto the skin 2 (two) times a week.   Yes Historical Provider, MD  hyoscyamine (LEVBID) 0.375 MG 12 hr tablet Take 0.375 mg by mouth at bedtime.   Yes Historical Provider, MD  lisinopril-hydrochlorothiazide (PRINZIDE,ZESTORETIC) 20-12.5 MG per tablet Take 1 tablet by mouth daily. 07/18/14  Yes Wendie Agreste, MD  Pancrelipase, Lip-Prot-Amyl, (ZENPEP) 40000 UNITS CPEP Take 40,000 Units by mouth 3 (three) times daily with meals.    Yes Historical Provider, MD  Probiotic Product (ALIGN) 4 MG CAPS Take 4 mg by mouth daily.   Yes Historical Provider, MD    Current Facility-Administered Medications  Medication Dose Route Frequency Provider Last Rate Last Dose  . 0.9 %  sodium chloride infusion   Intravenous Continuous Ripudeep Krystal Eaton, MD 125 mL/hr at 08/11/14 1728    . acetaminophen (TYLENOL) tablet 650 mg  650 mg Oral Q4H PRN Ripudeep Krystal Eaton, MD   650 mg at 08/11/14 1515  . ALPRAZolam Duanne Moron) tablet 0.5 mg  0.5 mg Oral BID PRN Tresa Garter, MD   0.5 mg at 08/11/14 1812  .  amLODipine (NORVASC) tablet 10 mg  10 mg Oral Daily Tresa Garter, MD   10 mg at 08/11/14 1107  . aspirin EC tablet 81 mg  81 mg Oral Daily Tresa Garter, MD   81 mg at 08/11/14 1106  . HYDROmorphone (DILAUDID) injection 1 mg  1 mg Intravenous Q4H PRN Tresa Garter, MD   1 mg at 08/11/14 1802  . ondansetron (ZOFRAN) tablet 4 mg  4 mg Oral Q6H PRN Tresa Garter, MD       Or  . ondansetron (ZOFRAN) injection 4 mg  4 mg Intravenous Q6H PRN Tresa Garter, MD      . piperacillin-tazobactam (ZOSYN) IVPB 3.375 g  3.375 g Intravenous 3 times per day Eudelia Bunch, RPH      . saccharomyces boulardii (FLORASTOR) capsule 250 mg  250 mg Oral BID Ripudeep K Rai, MD   250 mg at 08/11/14 1106    Allergies as of 08/10/2014 - Review Complete 08/10/2014  Allergen Reaction Noted  . Ivp dye [iodinated diagnostic agents] Hives 01/18/2012    Family History  Problem Relation Age of Onset  . Heart disease Mother   . Stroke Father   . Stroke Sister   . Heart disease Brother     History   Social History  .  Marital Status: Divorced    Spouse Name: N/A    Number of Children: 1  . Years of Education: N/A   Occupational History  . retired     retired   Social History Main Topics  . Smoking status: Former Smoker -- 0.75 packs/day for 44 years    Types: Cigarettes    Quit date: 11/29/1999  . Smokeless tobacco: Never Used     Comment: quit 15  yrs ago  . Alcohol Use: 0.6 oz/week    1 Glasses of wine per week     Comment: wine with dinner and socially  . Drug Use: No  . Sexual Activity: Yes    Birth Control/ Protection: None   Other Topics Concern  . Not on file   Social History Narrative   Patient lives at home with her sister and she is retired.   Education some college.   Right handed.   Caffeine three cups of coffee daily.    Review of Systems: Gen: + fever, chills, sweats, anorexia, fatigue, weakness, malaise, weight loss. CV: Denies chest pain, angina,  palpitations, syncope, orthopnea, PND, peripheral edema, and claudication. Resp: Denies dyspnea at rest, dyspnea with exercise, cough, sputum, wheezing, coughing up blood, and pleurisy. GI: Denies vomiting blood, jaundice, and fecal incontinence.   Denies dysphagia or odynophagia. GU : Denies urinary burning, blood in urine, urinary frequency, urinary hesitancy, nocturnal urination, and urinary incontinence. MS: Denies joint pain, limitation of movement, and swelling, stiffness, low back pain, extremity pain. Denies muscle weakness, cramps, atrophy.  Derm: Denies rash, itching, dry skin, hives, moles, warts, or unhealing ulcers.  Psych: Denies depression, anxiety, memory loss, suicidal ideation, hallucinations, paranoia, and confusion. Heme: Denies bruising, bleeding, and enlarged lymph nodes. Neuro:  Denies any headaches, dizziness, paresthesias. Endo:  Denies any problems with DM, thyroid, adrenal function.  Physical Exam: Vital signs in last 24 hours: Temp:  [97.3 F (36.3 C)-102.7 F (39.3 C)] 101.1 F (38.4 C) (09/14 1813) Pulse Rate:  [79-124] 111 (09/14 1813) Resp:  [17-22] 18 (09/14 1813) BP: (102-123)/(53-69) 123/66 mmHg (09/14 1813) SpO2:  [90 %-95 %] 94 % (09/14 1813) Weight:  [61.1 kg (134 lb 11.2 oz)] 61.1 kg (134 lb 11.2 oz) (09/14 0208) Last BM Date: 08/11/14 General:   Alert,  Well-developed, well-nourished, pleasant and cooperative in NAD Head:  Normocephalic and atraumatic. Eyes:  Sclera clear, no icterus.   Conjunctiva pink. Ears:  Normal auditory acuity. Nose:  No deformity, discharge,  or lesions. Mouth:  No deformity or lesions.   Neck:  Supple; no masses or thyromegaly. Lungs:  Clear throughout to auscultation.   No wheezes, crackles, or rhonchi. Heart:  Regular rate and rhythm; no murmurs, clicks, rubs,  or gallops. Abdomen:  Bowel sounds are active.  Diffuse tenderness, early lower abdominal rebound.  Hepar edge palpable in LUQ.  No mass. Rectal:  Deferred   Msk:  Symmetrical without gross deformities. . Pulses:  Normal pulses noted. Extremities:  Without clubbing or edema. Neurologic:  Alert and  oriented x4;  grossly normal neurologically. Skin:  Intact without significant lesions or rashes.. Psych:  Alert and cooperative. Normal mood and affect.  Intake/Output from previous day: 09/13 0701 - 09/14 0700 In: 500 [I.V.:500] Out: -  Intake/Output this shift:    Lab Results:  Recent Labs  08/10/14 1915 08/11/14 0731  WBC 12.7* 20.8*  HGB 13.6 11.1*  HCT 40.9 33.6*  PLT 389 287   BMET  Recent Labs  08/10/14 1915 08/11/14 0731  NA 135* 134*  K 4.2 4.2  CL 98 101  CO2 23 22  GLUCOSE 113* 148*  BUN 11 11  CREATININE 1.22* 1.09  CALCIUM 9.6 8.1*   LFT  Recent Labs  08/11/14 0731  PROT 5.6*  ALBUMIN 2.4*  AST 24  ALT 13  ALKPHOS 119*  BILITOT 0.5   PT/INR No results found for this basename: LABPROT, INR,  in the last 72 hours Hepatitis Panel No results found for this basename: HEPBSAG, HCVAB, HEPAIGM, HEPBIGM,  in the last 72 hours    Studies/Results:    MPRESSION:  Chronic ischemic colitis.  Onset of illness over a month ago without resolution despite conservative medical management.  Worsening with fever, leucocytosis, increased pain leading to current admission.  Probable surgical intervention needed for possible necrotic colon.  Responding initially to antibiotic and narcotic analgesia.  Poor prognosis without more definitive treatment based on limited improvement to date.    Hepatic lesion remains unexplained.  No malignancy seen on biopsy.  If surgery is performed for colectomy, biopsy of uninvolved right hepatic lobe and repeat biopsy of involved left hepatic lobe will be needed.  PLAN: 1.  Stool c/s 2.  Surgery consult 3.  Agree with broad spectrum antibiotic coverage 4.  IV fluid support and narcotic analgesia. 5.  Call me if any additional questions arise:  [phone].   Birdena Kingma R   08/11/2014, 7:41 PM

## 2014-08-11 NOTE — Consult Note (Deleted)
  Recent Labs  08/10/14 1915 08/11/14 0731  NA 135* 134*  K 4.2 4.2  CL 98 101  CO2 23 22  GLUCOSE 113* 148*  BUN 11 11  CREATININE 1.22* 1.09  CALCIUM 9.6 8.1*

## 2014-08-11 NOTE — Progress Notes (Signed)
ANTIBIOTIC CONSULT NOTE - INITIAL  Pharmacy Consult for zosyn Indication: Intra-abdominal Infection   Allergies  Allergen Reactions  . Ivp Dye [Iodinated Diagnostic Agents] Hives    Patient Measurements: Height: 4\' 11"  (149.9 cm) Weight: 134 lb 11.2 oz (61.1 kg) IBW/kg (Calculated) : 43.2   Vital Signs: Temp: 102.7 F (39.3 C) (09/14 1402) Temp src: Oral (09/14 1402) BP: 102/53 mmHg (09/14 1402) Pulse Rate: 94 (09/14 1402) Intake/Output from previous day: 09/13 0701 - 09/14 0700 In: 500 [I.V.:500] Out: -  Intake/Output from this shift: Total I/O In: 330 [P.O.:330] Out: -   Labs:  Recent Labs  08/10/14 1915 08/11/14 0731  WBC 12.7* 20.8*  HGB 13.6 11.1*  PLT 389 287  CREATININE 1.22* 1.09   Estimated Creatinine Clearance: 39.3 ml/min (by C-G formula based on Cr of 1.09). No results found for this basename: VANCOTROUGH, Corlis Leak, VANCORANDOM, [place], GENTPEAK, GENTRANDOM, TOBRATROUGH, TOBRAPEAK, TOBRARND, AMIKACINPEAK, AMIKACINTROU, AMIKACIN,  in the last 72 hours   Microbiology: Recent Results (from the past 720 hour(s))  CLOSTRIDIUM DIFFICILE BY PCR     Status: None   Collection Time    08/11/14  2:06 AM      Result Value Ref Range Status   C difficile by pcr NEGATIVE  NEGATIVE Final    Medical History: Past Medical History  Diagnosis Date  . Hypertension   . Allergy   . Anxiety   . Osteoporosis     Medications:  Prescriptions prior to admission  Medication Sig Dispense Refill  . ALPRAZolam (XANAX) 0.5 MG tablet Take 1 tablet (0.5 mg total) by mouth 2 (two) times daily as needed for anxiety.  30 tablet  1  . amLODipine (NORVASC) 10 MG tablet Take 1 tablet (10 mg total) by mouth daily.  90 tablet  1  . aspirin 81 MG tablet Take 81 mg by mouth daily.      Marland Kitchen estradiol (VIVELLE-DOT) 0.05 MG/24HR patch Place 1 patch onto the skin 2 (two) times a week.      . hyoscyamine (LEVBID) 0.375 MG 12 hr tablet Take 0.375 mg by mouth at bedtime.      Marland Kitchen  lisinopril-hydrochlorothiazide (PRINZIDE,ZESTORETIC) 20-12.5 MG per tablet Take 1 tablet by mouth daily.  90 tablet  1  . Pancrelipase, Lip-Prot-Amyl, (ZENPEP) 40000 UNITS CPEP Take 40,000 Units by mouth 3 (three) times daily with meals.       . Probiotic Product (ALIGN) 4 MG CAPS Take 4 mg by mouth daily.       Assessment: Pharmacy consulted to dose zosyn for Intra-abdominal Infection.  68 yo F with acute colitis, c diff negative, on IV cipro and flagyl and florastor.  Now cipro/flagyl to be changed to zosyn. WBC 12.7>>20.8, tmax up to 102.7.  Creat 1.09, wt 61 kg  9/14 c diff negative 9/14 bc x2 9/14 ucx   9/14 cipro x 1 dose 9/14 flagyl x 2 doses 9/14 zosyn>>>   Goal of Therapy:  eradicate infection  Plan:  Zosyn 3.375 gm IV x 1 dose over 30 minutes, then zosyn 3.375 gm IV q8h, infuse each dose over 4 hours  Eudelia Bunch, Pharm.D. [phone] 08/11/2014 4:12 PM

## 2014-08-11 NOTE — H&P (Signed)
[facility] History and Physical  Brianna Duncan PJA:[id number] DOB: [birth year] DOA: 08/10/2014  PCP: Wendie Agreste, MD   Chief Complaint: Abdominal pain x2 days duration associated with diarrhea  HPI: Brianna Duncan is a 68 y.o. female with history of hypertension, generalized anxiety, osteoporosis, and liver lesions status post biopsy came to the ED today because of severe abdominal pain and diarrhea. Diarrhea is mostly frequent defecation, watery but small quantity at the time associated with anal pressure, no blood in stool at this time though she used to have bloody stool. She had colonoscopy in July 2015, incomplete because the GI specialist could not advance beyond a visible mass and stricture in the colon. She was diagnosed with acute colitis in August 2015 at the same time she had a liver biopsy for a lesion, but biopsy is non-diagnostic. In the ER patient had CT abdomen done which showed active colitis as reported below.  General: The patient denies anorexia, fever, weight loss Cardiac: Denies chest pain, syncope, palpitations, pedal edema  Respiratory: Denies cough, shortness of breath, wheezing GI: Denies severe indigestion/heartburn, abdominal pain, nausea, vomiting, diarrhea and constipation GU: Denies hematuria, incontinence, dysuria  Musculoskeletal: Denies arthritis  Skin: Denies suspicious skin lesions Neurologic: Denies focal weakness or numbness, change in vision  Past Medical History  Diagnosis Date  . Hypertension   . Allergy   . Anxiety   . Osteoporosis     Past Surgical History  Procedure Laterality Date  . Abdominal hysterectomy    . Foot surgery      right foot, left great toe and left second toe surgery  . Colonoscopy      Social History: Drinks alcohol occasionally, quit smoking 15 years ago Lives at home Intact ADLs  Allergies  Allergen Reactions  . Ivp Dye [Iodinated Diagnostic Agents] Hives    Family History  Problem  Relation Age of Onset  . Heart disease Mother   . Stroke Father   . Stroke Sister   . Heart disease Brother       Prior to Admission medications   Medication Sig Start Date End Date Taking? Authorizing Provider  ALPRAZolam Duanne Moron) 0.5 MG tablet Take 1 tablet (0.5 mg total) by mouth 2 (two) times daily as needed for anxiety. 07/18/14  Yes Wendie Agreste, MD  amLODipine (NORVASC) 10 MG tablet Take 1 tablet (10 mg total) by mouth daily. 07/18/14  Yes Wendie Agreste, MD  aspirin 81 MG tablet Take 81 mg by mouth daily.   Yes Historical Provider, MD  estradiol (VIVELLE-DOT) 0.05 MG/24HR patch Place 1 patch onto the skin 2 (two) times a week.   Yes Historical Provider, MD  hyoscyamine (LEVBID) 0.375 MG 12 hr tablet Take 0.375 mg by mouth at bedtime.   Yes Historical Provider, MD  lisinopril-hydrochlorothiazide (PRINZIDE,ZESTORETIC) 20-12.5 MG per tablet Take 1 tablet by mouth daily. 07/18/14  Yes Wendie Agreste, MD  Pancrelipase, Lip-Prot-Amyl, (ZENPEP) 40000 UNITS CPEP Take 40,000 Units by mouth 3 (three) times daily with meals.    Yes Historical Provider, MD  Probiotic Product (ALIGN) 4 MG CAPS Take 4 mg by mouth daily.   Yes Historical Provider, MD     Physical Exam: Filed Vitals:   08/10/14 2230 08/10/14 2300 08/11/14 0045 08/11/14 0100  BP: 108/63 102/63 112/59 112/55  Pulse: 98 88 79 105  Temp:      TempSrc:      Resp: 17 22 17 20   Height:      SpO2: 90%  92% 95% 95%     General: Middle-aged woman, not in obvious respiratory distress, mildly dehydrated, not pale HEENT: Normocephalic and Atraumatic, Mucous membranes pink                PERRLA; EOM intact; No scleral icterus,                 Nares: Patent, Oropharynx: Clear, Fair Dentition                 Neck: FROM, no cervical lymphadenopathy, thyromegaly, carotid bruit or JVD;  Breasts: deferred CHEST WALL: No tenderness  CHEST: Normal respiration, clear to auscultation bilaterally  HEART: Regular rate and rhythm; no  murmurs rubs or gallops  BACK: No kyphosis or scoliosis; no CVA tenderness  ABDOMEN: Positive Bowel Sounds, soft, mildly tender mostly on the left iliac fossa, no organomegaly Rectal Exam: deferred EXTREMITIES: No cyanosis, clubbing, or edema Genitalia: not examined  SKIN:  no rash or ulceration  CNS: Alert and Oriented x 4, Nonfocal exam, CN 2-12 intact  Labs on Admission:  Basic Metabolic Panel:  Recent Labs Lab 08/10/14 1915  NA 135*  K 4.2  CL 98  CO2 23  GLUCOSE 113*  BUN 11  CREATININE 1.22*  CALCIUM 9.6   Liver Function Tests:  Recent Labs Lab 08/10/14 1915  AST 35  ALT 17  ALKPHOS 162*  BILITOT 0.4  PROT 7.2  ALBUMIN 3.3*    Recent Labs Lab 08/10/14 1915  LIPASE 17   No results found for this basename: AMMONIA,  in the last 168 hours CBC:  Recent Labs Lab 08/10/14 1915  WBC 12.7*  NEUTROABS 8.5*  HGB 13.6  HCT 40.9  MCV 96.7  PLT 389   Cardiac Enzymes: No results found for this basename: CKTOTAL, CKMB, CKMBINDEX, TROPONINI,  in the last 168 hours  BNP (last 3 results)  Recent Labs  07/11/14 0920  PROBNP 30.2   CBG: No results found for this basename: GLUCAP,  in the last 168 hours  Radiological Exams on Admission: Ct Abdomen Pelvis Wo Contrast  08/10/2014   CLINICAL DATA:  Lower abdominal pain and diarrhea.  EXAM: CT ABDOMEN AND PELVIS WITHOUT CONTRAST  TECHNIQUE: Multidetector CT imaging of the abdomen and pelvis was performed following the standard protocol without IV contrast.  COMPARISON:  CT of the abdomen and pelvis performed 07/14/2014, and MRI of the abdomen performed 07/02/2014  FINDINGS: The visualized lung bases are clear. Diffuse coronary artery calcifications are seen.  The suspected hepatic malignancy is difficult to characterize on noncontrast CT, spanning the lateral and medial segments of the left hepatic lobe as described on prior MRI. The spleen is unremarkable in appearance. A small stone is noted within the  gallbladder. The gallbladder is otherwise unremarkable. The pancreas and adrenal glands are within normal limits. There is mild atrophy of the pancreas.  An 8 mm stone is noted at the lower pole of the left kidney. A 1.3 cm cyst is noted at the interpole region of the right kidney. A smaller 4 mm calcification is noted at the upper pole of the right kidney. The kidneys are otherwise unremarkable in appearance. There is no definite evidence of hydronephrosis. No obstructing ureteral stones are seen.  No free fluid is identified. The small bowel is unremarkable in appearance. The stomach is within normal limits. No acute vascular abnormalities are seen. Diffuse calcification is seen along the abdominal aorta and its branches.  The appendix is diminutive and grossly unremarkable in  appearance. The cecum is flipped anteriorly. Much of the colon is filled with stool and contrast. There is marked wall thickening along the mid to distal sigmoid colon, with associated soft tissue inflammation, compatible with colitis. There is no definite evidence of perforation. Scattered diverticula are noted along the proximal sigmoid colon.  The bladder is mildly distended and grossly unremarkable. The patient is status post hysterectomy. No suspicious adnexal masses are seen. No inguinal lymphadenopathy is seen.  No acute osseous abnormalities are identified. Vacuum phenomenon and disc space narrowing noted at L5-S1, with minimal grade 1 retrolisthesis of L5 on S1 and endplate sclerotic change.  IMPRESSION: 1. Acute colitis noted involving the mid to distal sigmoid colon, with marked colonic wall thickening and associated soft tissue inflammation. No definite evidence of perforation. 2. The colon is filled with stool and contrast, to the level of the colitis, compatible with associated dysmotility. Scattered diverticula noted along the proximal sigmoid colon. 3. Suspected hepatic malignancy is not well assessed on noncontrast CT,  spanning the lateral and medial segments of the left hepatic lobe as described on prior MRI. 4. Diffuse coronary artery calcifications seen. 5. Cholelithiasis; gallbladder otherwise unremarkable. 6. Bilateral nonobstructing renal stones and small right renal cysts seen. 7. Diffuse calcification along the abdominal aorta and its branches.   Electronically Signed   By: Garald Balding M.D.   On: 08/10/2014 23:24    EKG: Independently reviewed.   Assessment/Plan Active Problems:   Acute colitis   IV ciprofloxacin and metronidazole  Pain control  Stool test for C. Difficile  N.p.o. for now  Rehydration with IV D5 normal saline   Urinalysis  Labs in a.m.  Restart home medications for hypertension and anxiety.  Consulted: Patient will need GI consult  Code Status: Full code  Family Communication: Discussed with daughter at the bedside DVT Prophylaxis: Lovenox  Time spent: 67 minutes  Dorthie Santini, MD [facility]  If 7PM-7AM, please contact night-coverage [email or web address] 08/11/2014, 1:31 AM

## 2014-08-12 ENCOUNTER — Encounter (HOSPITAL_COMMUNITY): Payer: Self-pay | Admitting: General Practice

## 2014-08-12 ENCOUNTER — Other Ambulatory Visit: Payer: Self-pay | Admitting: Internal Medicine

## 2014-08-12 ENCOUNTER — Inpatient Hospital Stay (HOSPITAL_COMMUNITY): Payer: Medicare Other

## 2014-08-12 DIAGNOSIS — R109 Unspecified abdominal pain: Secondary | ICD-10-CM

## 2014-08-12 LAB — GI PATHOGEN PANEL BY PCR, STOOL
C difficile toxin A/B: NEGATIVE
CAMPYLOBACTER BY PCR: NEGATIVE
Cryptosporidium by PCR: NEGATIVE
E coli (ETEC) LT/ST: NEGATIVE
E coli (STEC): NEGATIVE
E coli 0157 by PCR: NEGATIVE
G lamblia by PCR: NEGATIVE
NOROVIRUS G1/G2: NEGATIVE
ROTAVIRUS A BY PCR: NEGATIVE
SALMONELLA BY PCR: NEGATIVE
Shigella by PCR: NEGATIVE

## 2014-08-12 LAB — CBC
HCT: 32.3 % — ABNORMAL LOW (ref 36.0–46.0)
Hemoglobin: 10.5 g/dL — ABNORMAL LOW (ref 12.0–15.0)
MCH: 31 pg (ref 26.0–34.0)
MCHC: 32.5 g/dL (ref 30.0–36.0)
MCV: 95.3 fL (ref 78.0–100.0)
PLATELETS: 255 10*3/uL (ref 150–400)
RBC: 3.39 MIL/uL — AB (ref 3.87–5.11)
RDW: 15.1 % (ref 11.5–15.5)
WBC: 20.1 10*3/uL — ABNORMAL HIGH (ref 4.0–10.5)

## 2014-08-12 LAB — BASIC METABOLIC PANEL
Anion gap: 11 (ref 5–15)
BUN: 10 mg/dL (ref 6–23)
CALCIUM: 7.8 mg/dL — AB (ref 8.4–10.5)
CO2: 22 mEq/L (ref 19–32)
Chloride: 103 mEq/L (ref 96–112)
Creatinine, Ser: 0.96 mg/dL (ref 0.50–1.10)
GFR calc Af Amer: 69 mL/min — ABNORMAL LOW (ref >90)
GFR, EST NON AFRICAN AMERICAN: 59 mL/min — AB (ref >90)
GLUCOSE: 102 mg/dL — AB (ref 70–99)
POTASSIUM: 4.1 meq/L (ref 3.7–5.3)
Sodium: 136 mEq/L — ABNORMAL LOW (ref 137–147)

## 2014-08-12 LAB — URINALYSIS, ROUTINE W REFLEX MICROSCOPIC
Bilirubin Urine: NEGATIVE
GLUCOSE, UA: NEGATIVE mg/dL
HGB URINE DIPSTICK: NEGATIVE
KETONES UR: NEGATIVE mg/dL
Leukocytes, UA: NEGATIVE
Nitrite: NEGATIVE
PROTEIN: NEGATIVE mg/dL
Specific Gravity, Urine: 1.014 (ref 1.005–1.030)
Urobilinogen, UA: 0.2 mg/dL (ref 0.0–1.0)
pH: 5.5 (ref 5.0–8.0)

## 2014-08-12 LAB — C-REACTIVE PROTEIN: CRP: 13.9 mg/dL — ABNORMAL HIGH (ref <0.60)

## 2014-08-12 LAB — PROTIME-INR
INR: 1.56 — ABNORMAL HIGH (ref 0.00–1.49)
Prothrombin Time: 18.7 seconds — ABNORMAL HIGH (ref 11.6–15.2)

## 2014-08-12 MED ORDER — INFLUENZA VAC SPLIT QUAD 0.5 ML IM SUSY
0.5000 mL | PREFILLED_SYRINGE | INTRAMUSCULAR | Status: DC
  Filled 2014-08-12: qty 0.5

## 2014-08-12 MED ORDER — VANCOMYCIN HCL 500 MG IV SOLR
500.0000 mg | Freq: Two times a day (BID) | INTRAVENOUS | Status: DC
  Administered 2014-08-12 – 2014-08-14 (×4): 500 mg via INTRAVENOUS
  Filled 2014-08-12 (×7): qty 500

## 2014-08-12 MED ORDER — HEPARIN SODIUM (PORCINE) 5000 UNIT/ML IJ SOLN
5000.0000 [IU] | INTRAMUSCULAR | Status: AC
  Administered 2014-08-13: 5000 [IU] via SUBCUTANEOUS
  Filled 2014-08-12: qty 1

## 2014-08-12 NOTE — Progress Notes (Signed)
ANTIBIOTIC CONSULT NOTE - INITIAL  Pharmacy Consult for Vancomycin (continuing Zosyn) Indication: Intra-abdominal Infection, Sepsis   Allergies  Allergen Reactions  . Ivp Dye [Iodinated Diagnostic Agents] Hives    Patient Measurements: Height: 4\' 11"  (149.9 cm) Weight: 134 lb 11.2 oz (61.1 kg) IBW/kg (Calculated) : 43.2   Vital Signs: Temp: 103.1 F (39.5 C) (09/15 1416) Temp src: Oral (09/15 1416) BP: 102/54 mmHg (09/15 1416) Pulse Rate: 105 (09/15 1416) Intake/Output from previous day: 09/14 0701 - 09/15 0700 In: 330 [P.O.:330] Out: -  Intake/Output from this shift: Total I/O In: 480 [P.O.:480] Out: 250 [Stool:250]  Labs:  Recent Labs  08/10/14 1915 08/11/14 0731 08/12/14 0458  WBC 12.7* 20.8* 20.1*  HGB 13.6 11.1* 10.5*  PLT 389 287 255  CREATININE 1.22* 1.09 0.96   Estimated Creatinine Clearance: 44.6 ml/min (by C-G formula based on Cr of 0.96). No results found for this basename: VANCOTROUGH, VANCOPEAK, VANCORANDOM, [place], [place], GENTRANDOM, TOBRATROUGH, TOBRAPEAK, TOBRARND, AMIKACINPEAK, AMIKACINTROU, AMIKACIN,  in the last 72 hours   Microbiology: Recent Results (from the past 720 hour(s))  CLOSTRIDIUM DIFFICILE BY PCR     Status: None   Collection Time    08/11/14  2:06 AM      Result Value Ref Range Status   C difficile by pcr NEGATIVE  NEGATIVE Final  CULTURE, BLOOD (ROUTINE X 2)     Status: None   Collection Time    08/11/14  4:34 PM      Result Value Ref Range Status   Specimen Description BLOOD RIGHT ARM   Final   Special Requests BOTTLES DRAWN AEROBIC AND ANAEROBIC B 10CC R 5CC   Final   Culture  Setup Time     Final   Value: 08/11/2014 22:28     Performed at [facility]   Culture     Final   Value:        BLOOD CULTURE RECEIVED NO GROWTH TO DATE CULTURE WILL BE HELD FOR 5 DAYS BEFORE ISSUING A FINAL NEGATIVE REPORT     Performed at [facility]   Report Status PENDING   Incomplete  CULTURE, BLOOD (ROUTINE X 2)      Status: None   Collection Time    08/11/14  4:44 PM      Result Value Ref Range Status   Specimen Description BLOOD LEFT HAND   Final   Special Requests BOTTLES DRAWN AEROBIC AND ANAEROBIC B 10CC R [place]   Final   Culture  Setup Time     Final   Value: 08/11/2014 22:34     Performed at [facility]   Culture     Final   Value:        BLOOD CULTURE RECEIVED NO GROWTH TO DATE CULTURE WILL BE HELD FOR 5 DAYS BEFORE ISSUING A FINAL NEGATIVE REPORT     Note: Culture results may be compromised due to an excessive volume of blood received in culture bottles.     Performed at [facility]   Report Status PENDING   Incomplete    Medical History: Past Medical History  Diagnosis Date  . Hypertension   . Allergy     seasonal  . Anxiety   . Osteoporosis     Was on   . History of kidney stones   . On hormone replacement therapy     Patch    Medications:  Prescriptions prior to admission  Medication Sig Dispense Refill  . ALPRAZolam Duanne Moron)  0.5 MG tablet Take 1 tablet (0.5 mg total) by mouth 2 (two) times daily as needed for anxiety.  30 tablet  1  . amLODipine (NORVASC) 10 MG tablet Take 1 tablet (10 mg total) by mouth daily.  90 tablet  1  . aspirin 81 MG tablet Take 81 mg by mouth daily.      Marland Kitchen estradiol (VIVELLE-DOT) 0.05 MG/24HR patch Place 1 patch onto the skin 2 (two) times a week.      . hyoscyamine (LEVBID) 0.375 MG 12 hr tablet Take 0.375 mg by mouth at bedtime.      Marland Kitchen lisinopril-hydrochlorothiazide (PRINZIDE,ZESTORETIC) 20-12.5 MG per tablet Take 1 tablet by mouth daily.  90 tablet  1  . Pancrelipase, Lip-Prot-Amyl, (ZENPEP) 40000 UNITS CPEP Take 40,000 Units by mouth 3 (three) times daily with meals.       . Probiotic Product (ALIGN) 4 MG CAPS Take 4 mg by mouth daily.       Assessment: Patient is febrile to 103.1 currently. Pharmacy consulted to add Vancomycin to antibiotic regimen for sepsis in this 68 y.o female with an intra-abdominal Infection. Continuing  on Zosyn.  WBC remains elevated 20.1.C WBC 12.7>>20.8, tmax up to 102.7.  SCr stable at 0.96 today, wt 61 kg, estimated CrCl ~ 44 ml/min.   9/14 c diff negative 9/14 bc x2  NGTD 9/14 ucx  sent  9/14 cipro x 1 dose 9/14 flagyl x 2 doses 9/14 zosyn>>>   Goal of Therapy:  eradicate infection Vancomycin trough 15-20 mcg/ml   Plan:  Vancomycin 500 mg IV q12h  Continue Zosyn 3.375 gm IV q8h, infuse each dose over 4 hours.  Monitor clinical status, renal function, culture results daily.  Check vancomycin trough at steady state as needed per protocol.    Nicole Cella, RPh Clinical Pharmacist Pager: [phone] 08/12/2014 4:39 PM

## 2014-08-12 NOTE — Consult Note (Signed)
[facility] Surgery Consult Note  Brianna Duncan September 24, 1946  [id number].    Requesting MD: Dr. Tana Coast Chief Complaint/Reason for Consult: Colitis  HPI:  68 y.o. female with history of HTN, anxiety, osteoporosis, and liver lesions came to [facility] on 07/11/14 because of severe abdominal pain and diarrhea. She has been having these symptoms on and off since the end of July.  The symptoms worsened in mid August and she was admitted for further workup.  C/o diffuse crampy/"spasm type" abdominal pain, worse in LLQ and suprapubic.  C/o fatigue, weight loss, chills, nausea, weakness, anorexia, intolerance to solid food.  Diarrhea is mostly frequent defecation, watery but small quantity at the time associated with anal pressure and some inability to pass stool, no blood in stool at this time though she used to have bloody stool in July. She had her screening colonoscopy scheduled for July 2015, which was incomplete because the gastroenterologist could not advance beyond a visible mass and stricture in the sigmoid colon. She was diagnosed with acute colitis in August 2015 at the same time she had an IR image guided liver biopsy (Dr. Barbie Banner) on 07/10/14 for a liver lesion, but biopsy is non-diagnostic, but reportedly showed probable cirrhosis. In the ER patient had CT abdomen done which showed active colitis of the mid to distal sigmoid colon with marked wall thickening associated with soft tissue inflammation.  The colon was filled with stool and contrast until the level of the colitis.  WBC 20.8, no 20.1 today.  Up until the end of July she reports never having an abdominal problems, denies h/o GERD or IBD.  Only abdominal surgery is hysterectomy in 2006.  No h/o hernias or cancer.  No precipitating or alleviating factors.  No radiating pain.  Denies CP/SOB, fevers, difficulty urinating, dizziness.   ROS: All systems reviewed and otherwise negative except for as above  Family History  Problem Relation Age of  Onset  . Heart disease Mother   . Stroke Father   . Stroke Sister   . Heart disease Brother     Past Medical History  Diagnosis Date  . Hypertension   . Allergy   . Anxiety   . Osteoporosis   . History of kidney stones     Past Surgical History  Procedure Laterality Date  . Abdominal hysterectomy    . Foot surgery      right foot, left great toe and left second toe surgery  . Colonoscopy      Social History:  reports that she quit smoking about 14 years ago. Her smoking use included Cigarettes. She has a 33 pack-year smoking history. She has never used smokeless tobacco. She reports that she drinks about .6 ounces of alcohol per week. She reports that she does not use illicit drugs.  Allergies:  Allergies  Allergen Reactions  . Ivp Dye [Iodinated Diagnostic Agents] Hives    Medications Prior to Admission  Medication Sig Dispense Refill  . ALPRAZolam (XANAX) 0.5 MG tablet Take 1 tablet (0.5 mg total) by mouth 2 (two) times daily as needed for anxiety.  30 tablet  1  . amLODipine (NORVASC) 10 MG tablet Take 1 tablet (10 mg total) by mouth daily.  90 tablet  1  . aspirin 81 MG tablet Take 81 mg by mouth daily.      Marland Kitchen estradiol (VIVELLE-DOT) 0.05 MG/24HR patch Place 1 patch onto the skin 2 (two) times a week.      . hyoscyamine (LEVBID) 0.375 MG 12  hr tablet Take 0.375 mg by mouth at bedtime.      Marland Kitchen lisinopril-hydrochlorothiazide (PRINZIDE,ZESTORETIC) 20-12.5 MG per tablet Take 1 tablet by mouth daily.  90 tablet  1  . Pancrelipase, Lip-Prot-Amyl, (ZENPEP) 40000 UNITS CPEP Take 40,000 Units by mouth 3 (three) times daily with meals.       . Probiotic Product (ALIGN) 4 MG CAPS Take 4 mg by mouth daily.        Blood pressure 102/60, pulse 95, temperature 98.8 F (37.1 C), temperature source Oral, resp. rate 15, height 4' 11"  (1.499 m), weight 134 lb 11.2 oz (61.1 kg), SpO2 90.00%. Physical Exam: General: pleasant, WD/WN white female who is laying in bed in NAD HEENT: head is  normocephalic, atraumatic.  Sclera are noninjected.  PERRL.  Ears and nose without any masses or lesions.  Mouth is pink and moist.  Patient is tearful.   Heart: regular, rate, and rhythm.  No obvious murmurs, gallops, or rubs noted.  Palpable pedal pulses bilaterally Lungs: CTAB, no wheezes, rhonchi, or rales noted.  Respiratory effort nonlabored Abd: soft, ND tender in the LLQ/suprapubic region, few BS, no masses, hernias, or organomegaly, lower horizontal well healed scar (hysterectomy) MS: all 4 extremities are symmetrical with no cyanosis, clubbing, or edema. Skin: warm and dry with no masses, lesions, or rashes Psych: A&Ox3 with an appropriate affect.    Results for orders placed during the hospital encounter of 08/10/14 (from the past 48 hour(s))  URINALYSIS, ROUTINE W REFLEX MICROSCOPIC     Status: Abnormal   Collection Time    08/10/14  7:06 PM      Result Value Ref Range   Color, Urine YELLOW  YELLOW   APPearance CLOUDY (*) CLEAR   Specific Gravity, Urine 1.012  1.005 - 1.030   pH 6.0  5.0 - 8.0   Glucose, UA NEGATIVE  NEGATIVE mg/dL   Hgb urine dipstick NEGATIVE  NEGATIVE   Bilirubin Urine NEGATIVE  NEGATIVE   Ketones, ur 15 (*) NEGATIVE mg/dL   Protein, ur NEGATIVE  NEGATIVE mg/dL   Urobilinogen, UA 0.2  0.0 - 1.0 mg/dL   Nitrite NEGATIVE  NEGATIVE   Leukocytes, UA NEGATIVE  NEGATIVE   Comment: MICROSCOPIC NOT DONE ON URINES WITH NEGATIVE PROTEIN, BLOOD, LEUKOCYTES, NITRITE, OR GLUCOSE <1000 mg/dL.  CBC WITH DIFFERENTIAL     Status: Abnormal   Collection Time    08/10/14  7:15 PM      Result Value Ref Range   WBC 12.7 (*) 4.0 - 10.5 K/uL   RBC 4.23  3.87 - 5.11 MIL/uL   Hemoglobin 13.6  12.0 - 15.0 g/dL   HCT 40.9  36.0 - 46.0 %   MCV 96.7  78.0 - 100.0 fL   MCH 32.2  26.0 - 34.0 pg   MCHC 33.3  30.0 - 36.0 g/dL   RDW 14.7  11.5 - 15.5 %   Platelets 389  150 - 400 K/uL   Neutrophils Relative % 67  43 - 77 %   Neutro Abs 8.5 (*) 1.7 - 7.7 K/uL   Lymphocytes  Relative 20  12 - 46 %   Lymphs Abs 2.5  0.7 - 4.0 K/uL   Monocytes Relative 12  3 - 12 %   Monocytes Absolute 1.5 (*) 0.1 - 1.0 K/uL   Eosinophils Relative 1  0 - 5 %   Eosinophils Absolute 0.1  0.0 - 0.7 K/uL   Basophils Relative 1  0 - 1 %   Basophils  Absolute 0.1  0.0 - 0.1 K/uL  COMPREHENSIVE METABOLIC PANEL     Status: Abnormal   Collection Time    08/10/14  7:15 PM      Result Value Ref Range   Sodium 135 (*) 137 - 147 mEq/L   Potassium 4.2  3.7 - 5.3 mEq/L   Chloride 98  96 - 112 mEq/L   CO2 23  19 - 32 mEq/L   Glucose, Bld 113 (*) 70 - 99 mg/dL   BUN 11  6 - 23 mg/dL   Creatinine, Ser 1.22 (*) 0.50 - 1.10 mg/dL   Calcium 9.6  8.4 - 10.5 mg/dL   Total Protein 7.2  6.0 - 8.3 g/dL   Albumin 3.3 (*) 3.5 - 5.2 g/dL   AST 35  0 - 37 U/L   ALT 17  0 - 35 U/L   Alkaline Phosphatase 162 (*) 39 - 117 U/L   Total Bilirubin 0.4  0.3 - 1.2 mg/dL   GFR calc non Af Amer 44 (*) >90 mL/min   GFR calc Af Amer 52 (*) >90 mL/min   Comment: (NOTE)     The eGFR has been calculated using the CKD EPI equation.     This calculation has not been validated in all clinical situations.     eGFR's persistently <90 mL/min signify possible Chronic Kidney     Disease.   Anion gap 14  5 - 15  LIPASE, BLOOD     Status: None   Collection Time    08/10/14  7:15 PM      Result Value Ref Range   Lipase 17  11 - 59 U/L  CLOSTRIDIUM DIFFICILE BY PCR     Status: None   Collection Time    08/11/14  2:06 AM      Result Value Ref Range   C difficile by pcr NEGATIVE  NEGATIVE  COMPREHENSIVE METABOLIC PANEL     Status: Abnormal   Collection Time    08/11/14  7:31 AM      Result Value Ref Range   Sodium 134 (*) 137 - 147 mEq/L   Potassium 4.2  3.7 - 5.3 mEq/L   Chloride 101  96 - 112 mEq/L   CO2 22  19 - 32 mEq/L   Glucose, Bld 148 (*) 70 - 99 mg/dL   BUN 11  6 - 23 mg/dL   Creatinine, Ser 1.09  0.50 - 1.10 mg/dL   Calcium 8.1 (*) 8.4 - 10.5 mg/dL   Total Protein 5.6 (*) 6.0 - 8.3 g/dL   Albumin  2.4 (*) 3.5 - 5.2 g/dL   AST 24  0 - 37 U/L   ALT 13  0 - 35 U/L   Alkaline Phosphatase 119 (*) 39 - 117 U/L   Total Bilirubin 0.5  0.3 - 1.2 mg/dL   GFR calc non Af Amer 51 (*) >90 mL/min   GFR calc Af Amer 59 (*) >90 mL/min   Comment: (NOTE)     The eGFR has been calculated using the CKD EPI equation.     This calculation has not been validated in all clinical situations.     eGFR's persistently <90 mL/min signify possible Chronic Kidney     Disease.   Anion gap 11  5 - 15  CBC     Status: Abnormal   Collection Time    08/11/14  7:31 AM      Result Value Ref Range   WBC 20.8 (*) 4.0 -  10.5 K/uL   Comment: REPEATED TO VERIFY   RBC 3.44 (*) 3.87 - 5.11 MIL/uL   Hemoglobin 11.1 (*) 12.0 - 15.0 g/dL   Comment: REPEATED TO VERIFY   HCT 33.6 (*) 36.0 - 46.0 %   MCV 97.7  78.0 - 100.0 fL   MCH 32.3  26.0 - 34.0 pg   MCHC 33.0  30.0 - 36.0 g/dL   RDW 14.9  11.5 - 15.5 %   Platelets 287  150 - 400 K/uL   Comment: REPEATED TO VERIFY  LACTIC ACID, PLASMA     Status: None   Collection Time    08/11/14  4:34 PM      Result Value Ref Range   Lactic Acid, Venous 1.2  0.5 - 2.2 mmol/L  PROCALCITONIN     Status: None   Collection Time    08/11/14  4:36 PM      Result Value Ref Range   Procalcitonin 0.64     Comment:            Interpretation:     PCT > 0.5 ng/mL and <= 2 ng/mL:     Systemic infection (sepsis) is possible,     but other conditions are known to elevate     PCT as well.     (NOTE)             ICU PCT Algorithm               Non ICU PCT Algorithm        ----------------------------     ------------------------------             PCT < 0.25 ng/mL                 PCT < 0.1 ng/mL         Stopping of antibiotics            Stopping of antibiotics           strongly encouraged.               strongly encouraged.        ----------------------------     ------------------------------           PCT level decrease by               PCT < 0.25 ng/mL           >= 80% from peak  PCT           OR PCT 0.25 - 0.5 ng/mL          Stopping of antibiotics                                                 encouraged.         Stopping of antibiotics               encouraged.        ----------------------------     ------------------------------           PCT level decrease by              PCT >= 0.25 ng/mL           < 80% from peak PCT            AND PCT >= 0.5 ng/mL  Continuing antibiotics                                                  encouraged.           Continuing antibiotics                encouraged.        ----------------------------     ------------------------------         PCT level increase compared          PCT > 0.5 ng/mL             with peak PCT AND              PCT >= 0.5 ng/mL             Escalation of antibiotics                                              strongly encouraged.          Escalation of antibiotics            strongly encouraged.  SEDIMENTATION RATE     Status: None   Collection Time    08/11/14  4:36 PM      Result Value Ref Range   Sed Rate 21  0 - 22 mm/hr  C-REACTIVE PROTEIN     Status: Abnormal   Collection Time    08/11/14  6:28 PM      Result Value Ref Range   CRP 13.9 (*) <0.60 mg/dL   Comment: Performed at [place]     Status: None   Collection Time    08/12/14  2:50 AM      Result Value Ref Range   Color, Urine YELLOW  YELLOW   APPearance CLEAR  CLEAR   Specific Gravity, Urine 1.014  1.005 - 1.030   pH 5.5  5.0 - 8.0   Glucose, UA NEGATIVE  NEGATIVE mg/dL   Hgb urine dipstick NEGATIVE  NEGATIVE   Bilirubin Urine NEGATIVE  NEGATIVE   Ketones, ur NEGATIVE  NEGATIVE mg/dL   Protein, ur NEGATIVE  NEGATIVE mg/dL   Urobilinogen, UA 0.2  0.0 - 1.0 mg/dL   Nitrite NEGATIVE  NEGATIVE   Leukocytes, UA NEGATIVE  NEGATIVE   Comment: MICROSCOPIC NOT DONE ON URINES WITH NEGATIVE PROTEIN, BLOOD, LEUKOCYTES, NITRITE, OR GLUCOSE <1000 mg/dL.  BASIC METABOLIC PANEL      Status: Abnormal   Collection Time    08/12/14  4:58 AM      Result Value Ref Range   Sodium 136 (*) 137 - 147 mEq/L   Potassium 4.1  3.7 - 5.3 mEq/L   Chloride 103  96 - 112 mEq/L   CO2 22  19 - 32 mEq/L   Glucose, Bld 102 (*) 70 - 99 mg/dL   BUN 10  6 - 23 mg/dL   Creatinine, Ser 0.96  0.50 - 1.10 mg/dL   Calcium 7.8 (*) 8.4 - 10.5 mg/dL   GFR calc non Af Amer 59 (*) >90 mL/min   GFR calc Af Amer 69 (*) >90 mL/min   Comment: (NOTE)     The eGFR  has been calculated using the CKD EPI equation.     This calculation has not been validated in all clinical situations.     eGFR's persistently <90 mL/min signify possible Chronic Kidney     Disease.   Anion gap 11  5 - 15  CBC     Status: Abnormal   Collection Time    08/12/14  4:58 AM      Result Value Ref Range   WBC 20.1 (*) 4.0 - 10.5 K/uL   RBC 3.39 (*) 3.87 - 5.11 MIL/uL   Hemoglobin 10.5 (*) 12.0 - 15.0 g/dL   HCT 32.3 (*) 36.0 - 46.0 %   MCV 95.3  78.0 - 100.0 fL   MCH 31.0  26.0 - 34.0 pg   MCHC 32.5  30.0 - 36.0 g/dL   RDW 15.1  11.5 - 15.5 %   Platelets 255  150 - 400 K/uL   Ct Abdomen Pelvis Wo Contrast  08/10/2014   CLINICAL DATA:  Lower abdominal pain and diarrhea.  EXAM: CT ABDOMEN AND PELVIS WITHOUT CONTRAST  TECHNIQUE: Multidetector CT imaging of the abdomen and pelvis was performed following the standard protocol without IV contrast.  COMPARISON:  CT of the abdomen and pelvis performed 07/14/2014, and MRI of the abdomen performed 07/02/2014  FINDINGS: The visualized lung bases are clear. Diffuse coronary artery calcifications are seen.  The suspected hepatic malignancy is difficult to characterize on noncontrast CT, spanning the lateral and medial segments of the left hepatic lobe as described on prior MRI. The spleen is unremarkable in appearance. A small stone is noted within the gallbladder. The gallbladder is otherwise unremarkable. The pancreas and adrenal glands are within normal limits. There is mild atrophy  of the pancreas.  An 8 mm stone is noted at the lower pole of the left kidney. A 1.3 cm cyst is noted at the interpole region of the right kidney. A smaller 4 mm calcification is noted at the upper pole of the right kidney. The kidneys are otherwise unremarkable in appearance. There is no definite evidence of hydronephrosis. No obstructing ureteral stones are seen.  No free fluid is identified. The small bowel is unremarkable in appearance. The stomach is within normal limits. No acute vascular abnormalities are seen. Diffuse calcification is seen along the abdominal aorta and its branches.  The appendix is diminutive and grossly unremarkable in appearance. The cecum is flipped anteriorly. Much of the colon is filled with stool and contrast. There is marked wall thickening along the mid to distal sigmoid colon, with associated soft tissue inflammation, compatible with colitis. There is no definite evidence of perforation. Scattered diverticula are noted along the proximal sigmoid colon.  The bladder is mildly distended and grossly unremarkable. The patient is status post hysterectomy. No suspicious adnexal masses are seen. No inguinal lymphadenopathy is seen.  No acute osseous abnormalities are identified. Vacuum phenomenon and disc space narrowing noted at L5-S1, with minimal grade 1 retrolisthesis of L5 on S1 and endplate sclerotic change.  IMPRESSION: 1. Acute colitis noted involving the mid to distal sigmoid colon, with marked colonic wall thickening and associated soft tissue inflammation. No definite evidence of perforation. 2. The colon is filled with stool and contrast, to the level of the colitis, compatible with associated dysmotility. Scattered diverticula noted along the proximal sigmoid colon. 3. Suspected hepatic malignancy is not well assessed on noncontrast CT, spanning the lateral and medial segments of the left hepatic lobe as described on prior MRI. 4. Diffuse coronary  artery calcifications seen.  5. Cholelithiasis; gallbladder otherwise unremarkable. 6. Bilateral nonobstructing renal stones and small right renal cysts seen. 7. Diffuse calcification along the abdominal aorta and its branches.   Electronically Signed   By: Garald Balding M.D.   On: 08/10/2014 23:24     Assessment/Plan Colitis ? Infectious vs IBD Abdominal pain Diarrhea Liver lesion  Plan: 1.  GI following.  She's not completely obstructed at this point.  She's had 2 good BM's today compared to the runny small quantities she's been having.  WBC remains high.  Lactic acid was normal yesterday.  Her pain remains significant, but she does not have peritonitis.   2.  Switch to ice chips and sips of clears only, IVF, pain control, antiemetics, IV antibiotics (zosyn Day #2) 3.  SCD's, on aspirin 4.  Ambulate and IS 5.  Order DG colon with WS enema today to evaluate for purposes for pre-op 6.  Dr. Donne Hazel to discuss whether surgery is an option or whether conservative management is preferred to get her out of the acute phase. - NPO after MN, pre-op heparin   Coralie Keens, [facility] Surgery 08/12/2014, 10:21 AM Pager: [phone]

## 2014-08-12 NOTE — Consult Note (Signed)
I think she has had progressive large bowel obstruction. Etiology unclear, unable to really evaluate colon on endoscopy.  Will obtain enema today to evaluated this from below.  Concern still for hepatic issues despite her biopsy.  I think she will end up just requiring exploration for this obstruction.

## 2014-08-12 NOTE — Progress Notes (Signed)
Jinny Blossom Dort PA aware of temp 103

## 2014-08-12 NOTE — Progress Notes (Signed)
Patient ID: Brianna Duncan  female  WUJ:[id number]    DOB: Sep 19, 1946    DOA: 08/10/2014  PCP: Wendie Agreste, MD   Brief history of present illness Patient is a 68 y.o. female with history of hypertension, generalized anxiety, osteoporosis, and liver lesions status post biopsy came to the ED today because of severe abdominal pain and diarrhea. CT abdomen showed acute colitis. She is followed closely by her gastroenterologist, Dr Earlean Shawl. On 9/14, patient started spiking fever, 102.9, SIRS with leukocytosis and tachycardia. Gastroenterology was consulted and patient was seen by her own gastroenterologist, Dr. Earlean Shawl.    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 Per my discussion with Dr Earlean Shawl, patient had colonoscopy in August 2015 after she had a CT abdomen which was suspicious for colon cancer and appearance of hepatic malignancy involving the lateral and medial segments of left hepatic lobe. Colonoscopy was attempted on 8/3 however was limited to sigmoidoscopy because of acute ischemic colitis with stricture. Liver biopsy was obtained on 8/13 which came back likely cirrhosis read by pathology in [place] and [place]. Dr. Cher Nakai was planning to have another liver biopsy outpatient and also rule out any vasculitis or PAN.  - Cdiff negative, GI pathogen panel pending, stool cx pending - Continue IV Zosyn, blood cultures negative so far, florastor - Continue clears, IV fluids and pain control - I have discussed with Dr. 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 - May need  another liver biopsy, will follow recommendations from GI and surgery.     Chronic diastolic heart failure - Currently stable, follow closely for any fluid overload, dec IVF to 100cc/hr  DVT Prophylaxis: SCDs  Code Status: Full code  Family Communication: Discussed in detail with patient's daughter at the bedside  Disposition: Hopefully tomorrow if tolerating solids  Consultants:  None  Procedures:  None  Antibiotics:  IV ciprofloxacin dc'ed 9/14  IV Flagyldc'ed 9/14  IV Zosyn 9/14>>  Subjective: Patient seen and examined feels somewhat better, no nausea vomiting, diarrhea improving, stool was formed today. still having abdominal pain describes as "sore"  Objective: Weight change:   Intake/Output Summary (Last 24 hours) at 08/12/14 1004 Last data filed at 08/12/14 0830  Gross per 24 hour  Intake    450 ml  Output    250 ml  Net    200 ml   Blood pressure 102/60, pulse 95, temperature 98.8 F (37.1 C), temperature source Oral, resp. rate 15, height 4\' 11"  (1.499 m), weight 61.1 kg (134 lb 11.2 oz), SpO2 90.00%.  Physical Exam: General: Alert and awake, oriented x3, not in any acute distress. CVS: S1-S2 clear, no murmur rubs or gallops Chest: clear to auscultation bilaterally, no wheezing, rales or rhonchi Abdomen: soft minimal tenderness periumbilical area, nondistended, normal bowel sounds  Extremities: no cyanosis, clubbing or edema noted bilaterally   Lab Results: Basic Metabolic Panel:  Recent Labs Lab 08/11/14 0731 08/12/14 0458  NA 134* 136*  K 4.2 4.1  CL 101 103  CO2 22 22  GLUCOSE 148* 102*  BUN 11 10  CREATININE 1.09 0.96  CALCIUM 8.1* 7.8*   Liver Function  Tests:  Recent Labs Lab 08/10/14 1915 08/11/14 0731  AST 35 24  ALT 17 13  ALKPHOS 162* 119*  BILITOT 0.4 0.5  PROT 7.2 5.6*  ALBUMIN 3.3* 2.4*    Recent Labs Lab 08/10/14 1915  LIPASE 17   No results found for this basename: AMMONIA,  in the last 168  hours CBC:  Recent Labs Lab 08/10/14 1915 08/11/14 0731 08/12/14 0458  WBC 12.7* 20.8* 20.1*  NEUTROABS 8.5*  --   --   HGB 13.6 11.1* 10.5*  HCT 40.9 33.6* 32.3*  MCV 96.7 97.7 95.3  PLT 389 287 255   Cardiac Enzymes: No results found for this basename: CKTOTAL, CKMB, CKMBINDEX, TROPONINI,  in the last 168 hours BNP: No components found with this basename: POCBNP,  CBG: No results found for this basename: GLUCAP,  in the last 168 hours   Micro Results: Recent Results (from the past 240 hour(s))  CLOSTRIDIUM DIFFICILE BY PCR     Status: None   Collection Time    08/11/14  2:06 AM      Result Value Ref Range Status   C difficile by pcr NEGATIVE  NEGATIVE Final    Studies/Results: Ct Abdomen Pelvis Wo Contrast  08/10/2014   CLINICAL DATA:  Lower abdominal pain and diarrhea.  EXAM: CT ABDOMEN AND PELVIS WITHOUT CONTRAST  TECHNIQUE: Multidetector CT imaging of the abdomen and pelvis was performed following the standard protocol without IV contrast.  COMPARISON:  CT of the abdomen and pelvis performed 07/14/2014, and MRI of the abdomen performed 07/02/2014  FINDINGS: The visualized lung bases are clear. Diffuse coronary artery calcifications are seen.  The suspected hepatic malignancy is difficult to characterize on noncontrast CT, spanning the lateral and medial segments of the left hepatic lobe as described on prior MRI. The spleen is unremarkable in appearance. A small stone is noted within the gallbladder. The gallbladder is otherwise unremarkable. The pancreas and adrenal glands are within normal limits. There is mild atrophy of the pancreas.  An 8 mm stone is noted at the lower pole of the left kidney. A 1.3 cm cyst is noted at the interpole region of the right kidney. A smaller 4 mm calcification is noted at the upper pole of the right kidney. The kidneys are otherwise unremarkable in appearance. There is no definite evidence of hydronephrosis. No obstructing ureteral stones are  seen.  No free fluid is identified. The small bowel is unremarkable in appearance. The stomach is within normal limits. No acute vascular abnormalities are seen. Diffuse calcification is seen along the abdominal aorta and its branches.  The appendix is diminutive and grossly unremarkable in appearance. The cecum is flipped anteriorly. Much of the colon is filled with stool and contrast. There is marked wall thickening along the mid to distal sigmoid colon, with associated soft tissue inflammation, compatible with colitis. There is no definite evidence of perforation. Scattered diverticula are noted along the proximal sigmoid colon.  The bladder is mildly distended and grossly unremarkable. The patient is status post hysterectomy. No suspicious adnexal masses are seen. No inguinal lymphadenopathy is seen.  No acute osseous abnormalities are identified. Vacuum phenomenon and disc space narrowing noted at L5-S1, with minimal grade 1 retrolisthesis of L5 on S1 and endplate sclerotic change.  IMPRESSION: 1. Acute colitis noted involving the mid to distal sigmoid colon, with marked colonic wall thickening and associated soft tissue inflammation. No definite evidence of perforation. 2. The colon is filled with stool and contrast, to the  level of the colitis, compatible with associated dysmotility. Scattered diverticula noted along the proximal sigmoid colon. 3. Suspected hepatic malignancy is not well assessed on noncontrast CT, spanning the lateral and medial segments of the left hepatic lobe as described on prior MRI. 4. Diffuse coronary artery calcifications seen. 5. Cholelithiasis; gallbladder otherwise unremarkable. 6. Bilateral nonobstructing renal stones and small right renal cysts seen. 7. Diffuse calcification along the abdominal aorta and its branches.   Electronically Signed   By: Garald Balding M.D.   On: 08/10/2014 23:24   Ct Abdomen Pelvis Wo Contrast  07/15/2014   CLINICAL DATA:  Abdominal pain.  EXAM: CT  ABDOMEN AND PELVIS WITHOUT CONTRAST  TECHNIQUE: Multidetector CT imaging of the abdomen and pelvis was performed following the standard protocol without IV contrast.  COMPARISON:  07/10/2014  FINDINGS: The lung bases are grossly clear despite motion artifact.  Stable cirrhotic changes involving the liver. Suspected lesions in the left hepatic lobe are not well delineated. High attenuation material in the gallbladder could be a combination of stones, sludge and vicarious excretion. Mild stable common bile duct dilatation. The pancreas is grossly normal. Mild atrophy. The spleen is normal in size. No focal lesions. The adrenal glands and kidneys are unremarkable except for a stable lower pole left renal calculus. No obstructing ureteral calculi or bladder calculi.  The stomach, duodenum, small bowel and colon are unremarkable. No inflammatory changes, mass lesions or obstructive findings. No mesenteric or retroperitoneal mass or adenopathy. The aorta demonstrates stable atherosclerotic calcifications.  The bladder is unremarkable. The uterus is surgically absent. No pelvic mass or free pelvic fluid collections. No inguinal mass or adenopathy.  IMPRESSION: 1. Cirrhotic changes involving the liver and possible left hepatic lobe lesions. 2. High attenuation material the gallbladder likely a combination of sludge, stones and vicarious excretion. 3. No acute abdominal/pelvic findings or adenopathy. 4. Stable left lower pole renal calculus.   Electronically Signed   By: Kalman Jewels M.D.   On: 07/15/2014 02:20   Dg Chest 2 View  07/30/2014   CLINICAL DATA:  Hypoxia, prior history of smoking  EXAM: CHEST  2 VIEW  COMPARISON:  Prior chest x-ray 07/11/2014  FINDINGS: Cardiac and mediastinal contours are within normal limits. Trace atherosclerotic calcification in the transverse aorta. No focal airspace consolidation, pleural effusion, pneumothorax or suspicious pulmonary nodule. Stable mild bronchitic changes. No acute  osseous abnormality. Retained oral contrast material noted in the splenic flexure of the colon.  IMPRESSION: No active cardiopulmonary disease.   Electronically Signed   By: Jacqulynn Cadet M.D.   On: 07/30/2014 08:11   Dg Abd 1 View  08/07/2014   CLINICAL DATA:  Abdominal pain  EXAM: ABDOMEN - 1 VIEW  COMPARISON:  Abdominal radiographs July 13, 2014; CT abdomen and pelvis July 14, 2014  FINDINGS: There is contrast in the colon. There is diffuse stool throughout the colon. The bowel gas pattern overall is unremarkable. No obstruction or free air is seen on this supine examination.  IMPRESSION: Diffuse stool throughout colon. Overall bowel gas pattern unremarkable.   Electronically Signed   By: Lowella Grip M.D.   On: 08/07/2014 15:54   Dg Abd Portable 1v  07/13/2014   CLINICAL DATA:  Abdominal pain.  Recent liver biopsy.  EXAM: PORTABLE ABDOMEN - 1 VIEW  COMPARISON:  CT, 07/10/2014  FINDINGS: Residual oral contrast from the prior CT is within a nondistended colon. There is no bowel dilation to suggest obstruction. Left lower pole intrarenal calcifications seen. Soft  tissues are otherwise unremarkable. No gross free air on this supine study.  IMPRESSION: No acute findings.   Electronically Signed   By: Lajean Manes M.D.   On: 07/13/2014 10:20    Medications: Scheduled Meds: . amLODipine  10 mg Oral Daily  . aspirin EC  81 mg Oral Daily  . piperacillin-tazobactam (ZOSYN)  IV  3.375 g Intravenous 3 times per day  . saccharomyces boulardii  250 mg Oral BID      LOS: 2 days   Hargun Spurling M.D. [facility] 08/12/2014, 10:04 AM Pager: [phone]  If 7PM-7AM, please contact night-coverage [email or web address] Password [email or web address]  **Disclaimer: This note was dictated with voice recognition software. Similar sounding words can inadvertently be transcribed and this note may contain transcription errors which may not have been corrected upon publication of note.**

## 2014-08-13 LAB — URINE CULTURE: Colony Count: 3000

## 2014-08-13 LAB — SURGICAL PCR SCREEN
MRSA, PCR: NEGATIVE
Staphylococcus aureus: NEGATIVE

## 2014-08-13 LAB — CBC
HCT: 30.7 % — ABNORMAL LOW (ref 36.0–46.0)
Hemoglobin: 10 g/dL — ABNORMAL LOW (ref 12.0–15.0)
MCH: 31 pg (ref 26.0–34.0)
MCHC: 32.6 g/dL (ref 30.0–36.0)
MCV: 95 fL (ref 78.0–100.0)
PLATELETS: 255 10*3/uL (ref 150–400)
RBC: 3.23 MIL/uL — ABNORMAL LOW (ref 3.87–5.11)
RDW: 15.2 % (ref 11.5–15.5)
WBC: 13.9 10*3/uL — ABNORMAL HIGH (ref 4.0–10.5)

## 2014-08-13 LAB — BASIC METABOLIC PANEL
Anion gap: 10 (ref 5–15)
BUN: 10 mg/dL (ref 6–23)
CALCIUM: 7.5 mg/dL — AB (ref 8.4–10.5)
CO2: 22 mEq/L (ref 19–32)
Chloride: 102 mEq/L (ref 96–112)
Creatinine, Ser: 0.76 mg/dL (ref 0.50–1.10)
GFR calc Af Amer: >90 mL/min (ref >90)
GFR, EST NON AFRICAN AMERICAN: 85 mL/min — AB (ref >90)
GLUCOSE: 84 mg/dL (ref 70–99)
Potassium: 3.6 mEq/L — ABNORMAL LOW (ref 3.7–5.3)
SODIUM: 134 meq/L — AB (ref 137–147)

## 2014-08-13 LAB — COMPLEMENT, TOTAL: Compl, Total (CH50): 57 U/mL (ref 31–60)

## 2014-08-13 LAB — PROCALCITONIN: Procalcitonin: 0.93 ng/mL

## 2014-08-13 MED ORDER — HYDROMORPHONE HCL 1 MG/ML IJ SOLN
1.0000 mg | INTRAMUSCULAR | Status: DC | PRN
  Administered 2014-08-13 – 2014-08-14 (×8): 1 mg via INTRAVENOUS
  Filled 2014-08-13 (×2): qty 1

## 2014-08-13 MED ORDER — HYDROMORPHONE HCL PF 1 MG/ML IJ SOLN
0.5000 mg | Freq: Once | INTRAMUSCULAR | Status: DC
Start: 2014-08-13 — End: 2014-08-14

## 2014-08-13 MED ORDER — HYDROMORPHONE HCL PF 1 MG/ML IJ SOLN
1.0000 mg | INTRAMUSCULAR | Status: DC | PRN
  Filled 2014-08-13 (×7): qty 1

## 2014-08-13 NOTE — Progress Notes (Signed)
Temp 102.4 Dr Wendee Beavers aware 2 tylenol given pt's daughter talking with Dr

## 2014-08-13 NOTE — Progress Notes (Signed)
[facility] PROGRESS NOTE  Brianna Duncan HWE:[id number] DOB: 11-22-1946 DOA: 08/10/2014 PCP: Wendie Agreste, MD  Brief Narrative from prior PN: Patient is a 68 y.o. female with history of hypertension, generalized anxiety, osteoporosis, and liver lesions status post biopsy came to the ED today because of severe abdominal pain and diarrhea. CT abdomen showed acute colitis. She is followed closely by her gastroenterologist, Dr Earlean Shawl.  On 9/14, patient started spiking fever, 102.9, SIRS with leukocytosis and tachycardia. Gastroenterology was consulted and patient was seen by her own gastroenterologist, Dr. Earlean Shawl.    Assessment/Plan: Principal Problem:   Acute colitis - Cdiff negative, GI pathogen panel pending, stool cx pending  - Continue IV Zosyn, blood cultures negative to date, florastor  - Continue clears, IV fluids and pain control  - General surgery on board. Considering exploratory operation  Active Problems:   Unspecified essential hypertension - agree with holding amlodipine given soft blood pressures and active infection    Liver mass - General surgery on board and considering further work up options. Please refer to their note for details.    Chronic diastolic heart failure - stable and compensated currently.   Code Status: full Family Communication: None at bedside Disposition Plan: Pending further work up and recommendations.   Consultants:  General surgery: Dr. Donne Hazel  Procedures:  None  Antibiotics:  Vancomycin and Zosyn  HPI/Subjective: Brianna Duncan has no new complaints. No acute issues reported overnight. Pain tolerable with pain medications.  Objective: Filed Vitals:   08/13/14 0737  BP: 108/58  Pulse: 108  Temp: 100.4 F (38 C)  Resp: 19    Intake/Output Summary (Last 24 hours) at 08/13/14 1055 Last data filed at 08/12/14 1804  Gross per 24 hour  Intake    730 ml  Output      0 ml  Net    730 ml   Filed Weights   08/11/14  0208  Weight: 61.1 kg (134 lb 11.2 oz)    Exam:   General:  Brianna Duncan in nad, alert and awake  Cardiovascular: rrr, no mrg  Respiratory: cta bl, no wheezes  Abdomen: soft, generalized discomfort with palpation, no guarding no rebound tenderness  Musculoskeletal: no cyanosis or clubbing   Data Reviewed: Basic Metabolic Panel:  Recent Labs Lab 08/10/14 1915 08/11/14 0731 08/12/14 0458 08/13/14 0650  NA 135* 134* 136* 134*  K 4.2 4.2 4.1 3.6*  CL 98 101 103 102  CO2 23 22 22 22   GLUCOSE 113* 148* 102* 84  BUN 11 11 10 10   CREATININE 1.22* 1.09 0.96 0.76  CALCIUM 9.6 8.1* 7.8* 7.5*   Liver Function Tests:  Recent Labs Lab 08/10/14 1915 08/11/14 0731  AST 35 24  ALT 17 13  ALKPHOS 162* 119*  BILITOT 0.4 0.5  PROT 7.2 5.6*  ALBUMIN 3.3* 2.4*    Recent Labs Lab 08/10/14 1915  LIPASE 17   No results found for this basename: AMMONIA,  in the last 168 hours CBC:  Recent Labs Lab 08/10/14 1915 08/11/14 0731 08/12/14 0458 08/13/14 0650  WBC 12.7* 20.8* 20.1* 13.9*  NEUTROABS 8.5*  --   --   --   HGB 13.6 11.1* 10.5* 10.0*  HCT 40.9 33.6* 32.3* 30.7*  MCV 96.7 97.7 95.3 95.0  PLT 389 287 255 255   Cardiac Enzymes: No results found for this basename: CKTOTAL, CKMB, CKMBINDEX, TROPONINI,  in the last 168 hours BNP (last 3 results)  Recent Labs  07/11/14 0920  PROBNP 30.2   CBG:  No results found for this basename: GLUCAP,  in the last 168 hours  Recent Results (from the past 240 hour(s))  CLOSTRIDIUM DIFFICILE BY PCR     Status: None   Collection Time    08/11/14  2:06 AM      Result Value Ref Range Status   C difficile by pcr NEGATIVE  NEGATIVE Final  CULTURE, BLOOD (ROUTINE X 2)     Status: None   Collection Time    08/11/14  4:34 PM      Result Value Ref Range Status   Specimen Description BLOOD RIGHT ARM   Final   Special Requests BOTTLES DRAWN AEROBIC AND ANAEROBIC B 10CC R 5CC   Final   Culture  Setup Time     Final   Value: 08/11/2014  22:28     Performed at [facility]   Culture     Final   Value:        BLOOD CULTURE RECEIVED NO GROWTH TO DATE CULTURE WILL BE HELD FOR 5 DAYS BEFORE ISSUING A FINAL NEGATIVE REPORT     Performed at [facility]   Report Status PENDING   Incomplete  CULTURE, BLOOD (ROUTINE X 2)     Status: None   Collection Time    08/11/14  4:44 PM      Result Value Ref Range Status   Specimen Description BLOOD LEFT HAND   Final   Special Requests BOTTLES DRAWN AEROBIC AND ANAEROBIC B 10CC R 6CC   Final   Culture  Setup Time     Final   Value: 08/11/2014 22:34     Performed at [facility]   Culture     Final   Value:        BLOOD CULTURE RECEIVED NO GROWTH TO DATE CULTURE WILL BE HELD FOR 5 DAYS BEFORE ISSUING A FINAL NEGATIVE REPORT     Note: Culture results may be compromised due to an excessive volume of blood received in culture bottles.     Performed at [facility]   Report Status PENDING   Incomplete  SURGICAL PCR SCREEN     Status: None   Collection Time    08/12/14  8:49 PM      Result Value Ref Range Status   MRSA, PCR NEGATIVE  NEGATIVE Final   Staphylococcus aureus NEGATIVE  NEGATIVE Final   Comment:            The Xpert SA Assay (FDA     approved for NASAL specimens     in patients over 20 years of age),     is one component of     a comprehensive surveillance     program.  Test performance has     been validated by [employer] for patients greater     than or equal to 23 year old.     It is not intended     to diagnose infection nor to     guide or monitor treatment.     Studies: Dg Colon W/water Sol Cm  08/12/2014   CLINICAL DATA:  Evaluate for distal bowel obstruction  EXAM: COLON WITH WATER SOLUTION CONTRAST  COMPARISON:  08/10/2014  FINDINGS: On the scout radiograph there is enteric contrast material identified within the colon and distal small bowel loops. No abnormal small or large bowel dilatation identified. The enema tip was  carefully placed in the rectum and retaining balloon was insufflated.  Under gravity, the rectum and distal colon was opacified in a retrograde fashion. Contrast opacification to the proximal sigmoid colon was achieved. No obstructing lesion identified within the sigmoid colon and rectum. Post evacuation images were obtained which demonstrate decompression of the rectum, sigmoid colon.  IMPRESSION: 1. No evidence for obstruction of the sigmoid colon or rectum. 2. 3. These results were called by telephone at the time of interpretation on 08/12/2014 at 5:15 pm to Dr. Dr. Donne Hazel, who verbally acknowledged these results.   Electronically Signed   By: Kerby Moors M.D.   On: 08/12/2014 20:16    Scheduled Meds: . aspirin EC  81 mg Oral Daily  .  HYDROmorphone (DILAUDID) injection  0.5 mg Intravenous Once  . Influenza vac split quadrivalent PF  0.5 mL Intramuscular Tomorrow-1000  . piperacillin-tazobactam (ZOSYN)  IV  3.375 g Intravenous 3 times per day  . saccharomyces boulardii  250 mg Oral BID  . vancomycin  500 mg Intravenous Q12H   Continuous Infusions: . sodium chloride 125 mL/hr at 08/13/14 0236     Time spent: > 35 minutes    Velvet Bathe  [facility] Pager [phone] If 7PM-7AM, please contact night-coverage at [email or web address], password [facility] 08/13/2014, 10:55 AM  LOS: 3 days

## 2014-08-13 NOTE — Progress Notes (Signed)
Not obstructed, wbc better, still tender, I think she may end up needing diagnostic laparoscopy but will reevaluate in am

## 2014-08-13 NOTE — Progress Notes (Signed)
Patient ID: Brianna Duncan, female   DOB: November 26, 1946, 68 y.o.   MRN: [id number]     [place]      [address] [place], [place] [address]    Phone: [phone] FAX: [phone]     Subjective: Still has bloating and pain.  Had a formed stool.  No n/v.  WBC is down.  Still having temps.   Objective:  Vital signs:  Filed Vitals:   08/12/14 2027 08/12/14 2200 08/13/14 0529 08/13/14 0737  BP: 93/56 110/60 95/59 108/58  Pulse: 118  102 108  Temp: 99.9 F (37.7 C)  99.6 F (37.6 C) 100.4 F (38 C)  TempSrc:    Oral  Resp: 18  18 19   Height:      Weight:      SpO2: 92%  91% 92%    Last BM Date: 08/12/14  Intake/Output   Yesterday:  09/15 0701 - 09/16 0700 In: 1210 [P.O.:960; IV Piggyback:250] Out: 250 [Stool:250] This shift:    I/O last 3 completed shifts: In: 84 [P.O.:960; IV Piggyback:250] Out: 250 [Stool:250]     Physical Exam: General: Pt awake/alert/oriented x4 in no acute distress Abdomen: +BS. Soft.  Nondistended.  Mild TTP to lower abdomen.   No evidence of peritonitis.  No incarcerated hernias.   Problem List:   Principal Problem:   Acute colitis Active Problems:   Unspecified essential hypertension   Liver mass   Chronic diastolic heart failure    Results:   Labs: Results for orders placed during the hospital encounter of 08/10/14 (from the past 48 hour(s))  CULTURE, BLOOD (ROUTINE X 2)     Status: None   Collection Time    08/11/14  4:34 PM      Result Value Ref Range   Specimen Description BLOOD RIGHT ARM     Special Requests BOTTLES DRAWN AEROBIC AND ANAEROBIC B 10CC R 5CC     Culture  Setup Time       Value: 08/11/2014 22:28     Performed at [facility]   Culture       Value:        BLOOD CULTURE RECEIVED NO GROWTH TO DATE CULTURE WILL BE HELD FOR 5 DAYS BEFORE ISSUING A FINAL NEGATIVE REPORT     Performed at [facility]   Report Status PENDING    LACTIC  ACID, PLASMA     Status: None   Collection Time    08/11/14  4:34 PM      Result Value Ref Range   Lactic Acid, Venous 1.2  0.5 - 2.2 mmol/L  PROCALCITONIN     Status: None   Collection Time    08/11/14  4:36 PM      Result Value Ref Range   Procalcitonin 0.64     Comment:            Interpretation:     PCT > 0.5 ng/mL and <= 2 ng/mL:     Systemic infection (sepsis) is possible,     but other conditions are known to elevate     PCT as well.     (NOTE)             ICU PCT Algorithm               Non ICU PCT Algorithm        ----------------------------     ------------------------------  PCT < 0.25 ng/mL                 PCT < 0.1 ng/mL         Stopping of antibiotics            Stopping of antibiotics           strongly encouraged.               strongly encouraged.        ----------------------------     ------------------------------           PCT level decrease by               PCT < 0.25 ng/mL           >= 80% from peak PCT           OR PCT 0.25 - 0.5 ng/mL          Stopping of antibiotics                                                 encouraged.         Stopping of antibiotics               encouraged.        ----------------------------     ------------------------------           PCT level decrease by              PCT >= 0.25 ng/mL           < 80% from peak PCT            AND PCT >= 0.5 ng/mL            Continuing antibiotics                                                  encouraged.           Continuing antibiotics                encouraged.        ----------------------------     ------------------------------         PCT level increase compared          PCT > 0.5 ng/mL             with peak PCT AND              PCT >= 0.5 ng/mL             Escalation of antibiotics                                              strongly encouraged.          Escalation of antibiotics            strongly encouraged.  SEDIMENTATION RATE     Status: None   Collection Time     08/11/14  4:36 PM      Result Value Ref Range   Sed Rate 21  0 - 22 mm/hr  CULTURE, BLOOD (ROUTINE X 2)     Status: None   Collection Time    08/11/14  4:44 PM      Result Value Ref Range   Specimen Description BLOOD LEFT HAND     Special Requests BOTTLES DRAWN AEROBIC AND ANAEROBIC B 10CC R 6CC     Culture  Setup Time       Value: 08/11/2014 22:34     Performed at [facility]   Culture       Value:        BLOOD CULTURE RECEIVED NO GROWTH TO DATE CULTURE WILL BE HELD FOR 5 DAYS BEFORE ISSUING A FINAL NEGATIVE REPORT     Note: Culture results may be compromised due to an excessive volume of blood received in culture bottles.     Performed at [facility]   Report Status PENDING    GI PATHOGEN PANEL BY PCR, STOOL     Status: None   Collection Time    08/11/14  6:09 PM      Result Value Ref Range   Campylobacter by PCR Negative     C difficile toxin A/B Negative     E coli 0157 by PCR Negative     E coli (ETEC) LT/ST Negative     E coli (STEC) Negative     Salmonella by PCR Negative     Shigella by PCR Negative     Norovirus G!/G2 Negative     Rotavirus A by PCR Negative     G lamblia by PCR Negative     Cryptosporidium by PCR Negative     Comment: (NOTE)      ** Normal Reference Range for each Analyte: Not Detected **           The detection and identification of specific gastrointestinal     microbial nucleic acid from individuals exhibiting signs and     symptoms of gastrointestinal infection aids in the diagnosis     of gastrointestinal infection when used in conjunction with     clinical evaluation, laboratory findings, and epidemiological     information.     ** The xTAG Gastrointestinal Pathogen Panel results are presumptive     and must be confirmed by FDA-cleared tests or other acceptable     reference methods.  The results of this test should not be used as     the sole basis for diagnosis, treatment, or other patient management     decisions.      Performed using the [employer] xTAG Gastrointestinal Pathogen Panel test     kit.     Performed at [facility]     Status: Abnormal   Collection Time    08/11/14  6:28 PM      Result Value Ref Range   CRP 13.9 (*) <0.60 mg/dL   Comment: Performed at [place]     Status: None   Collection Time    08/12/14  2:50 AM      Result Value Ref Range   Color, Urine YELLOW  YELLOW   APPearance CLEAR  CLEAR   Specific Gravity, Urine 1.014  1.005 - 1.030   pH 5.5  5.0 - 8.0   Glucose, UA NEGATIVE  NEGATIVE mg/dL   Hgb urine dipstick NEGATIVE  NEGATIVE   Bilirubin Urine NEGATIVE  NEGATIVE   Ketones, ur NEGATIVE  NEGATIVE mg/dL  Protein, ur NEGATIVE  NEGATIVE mg/dL   Urobilinogen, UA 0.2  0.0 - 1.0 mg/dL   Nitrite NEGATIVE  NEGATIVE   Leukocytes, UA NEGATIVE  NEGATIVE   Comment: MICROSCOPIC NOT DONE ON URINES WITH NEGATIVE PROTEIN, BLOOD, LEUKOCYTES, NITRITE, OR GLUCOSE <1000 mg/dL.  BASIC METABOLIC PANEL     Status: Abnormal   Collection Time    08/12/14  4:58 AM      Result Value Ref Range   Sodium 136 (*) 137 - 147 mEq/L   Potassium 4.1  3.7 - 5.3 mEq/L   Chloride 103  96 - 112 mEq/L   CO2 22  19 - 32 mEq/L   Glucose, Bld 102 (*) 70 - 99 mg/dL   BUN 10  6 - 23 mg/dL   Creatinine, Ser 0.96  0.50 - 1.10 mg/dL   Calcium 7.8 (*) 8.4 - 10.5 mg/dL   GFR calc non Af Amer 59 (*) >90 mL/min   GFR calc Af Amer 69 (*) >90 mL/min   Comment: (NOTE)     The eGFR has been calculated using the CKD EPI equation.     This calculation has not been validated in all clinical situations.     eGFR's persistently <90 mL/min signify possible Chronic Kidney     Disease.   Anion gap 11  5 - 15  CBC     Status: Abnormal   Collection Time    08/12/14  4:58 AM      Result Value Ref Range   WBC 20.1 (*) 4.0 - 10.5 K/uL   RBC 3.39 (*) 3.87 - 5.11 MIL/uL   Hemoglobin 10.5 (*) 12.0 - 15.0 g/dL   HCT 32.3 (*) 36.0 - 46.0 %    MCV 95.3  78.0 - 100.0 fL   MCH 31.0  26.0 - 34.0 pg   MCHC 32.5  30.0 - 36.0 g/dL   RDW 15.1  11.5 - 15.5 %   Platelets 255  150 - 400 K/uL  PROTIME-INR     Status: Abnormal   Collection Time    08/12/14  3:25 PM      Result Value Ref Range   Prothrombin Time 18.7 (*) 11.6 - 15.2 seconds   INR 1.56 (*) 0.00 - 1.49  SURGICAL PCR SCREEN     Status: None   Collection Time    08/12/14  8:49 PM      Result Value Ref Range   MRSA, PCR NEGATIVE  NEGATIVE   Staphylococcus aureus NEGATIVE  NEGATIVE   Comment:            The Xpert SA Assay (FDA     approved for NASAL specimens     in patients over 90 years of age),     is one component of     a comprehensive surveillance     program.  Test performance has     been validated by [employer] for patients greater     than or equal to 7 year old.     It is not intended     to diagnose infection nor to     guide or monitor treatment.  BASIC METABOLIC PANEL     Status: Abnormal   Collection Time    08/13/14  6:50 AM      Result Value Ref Range   Sodium 134 (*) 137 - 147 mEq/L   Potassium 3.6 (*) 3.7 - 5.3 mEq/L   Chloride 102  96 - 112  mEq/L   CO2 22  19 - 32 mEq/L   Glucose, Bld 84  70 - 99 mg/dL   BUN 10  6 - 23 mg/dL   Creatinine, Ser 0.76  0.50 - 1.10 mg/dL   Calcium 7.5 (*) 8.4 - 10.5 mg/dL   GFR calc non Af Amer 85 (*) >90 mL/min   GFR calc Af Amer >90  >90 mL/min   Comment: (NOTE)     The eGFR has been calculated using the CKD EPI equation.     This calculation has not been validated in all clinical situations.     eGFR's persistently <90 mL/min signify possible Chronic Kidney     Disease.   Anion gap 10  5 - 15  CBC     Status: Abnormal   Collection Time    08/13/14  6:50 AM      Result Value Ref Range   WBC 13.9 (*) 4.0 - 10.5 K/uL   RBC 3.23 (*) 3.87 - 5.11 MIL/uL   Hemoglobin 10.0 (*) 12.0 - 15.0 g/dL   HCT 30.7 (*) 36.0 - 46.0 %   MCV 95.0  78.0 - 100.0 fL   MCH 31.0  26.0 - 34.0 pg   MCHC 32.6  30.0 -  36.0 g/dL   RDW 15.2  11.5 - 15.5 %   Platelets 255  150 - 400 K/uL  PROCALCITONIN     Status: None   Collection Time    08/13/14  6:50 AM      Result Value Ref Range   Procalcitonin 0.93     Comment:            Interpretation:     PCT > 0.5 ng/mL and <= 2 ng/mL:     Systemic infection (sepsis) is possible,     but other conditions are known to elevate     PCT as well.     (NOTE)             ICU PCT Algorithm               Non ICU PCT Algorithm        ----------------------------     ------------------------------             PCT < 0.25 ng/mL                 PCT < 0.1 ng/mL         Stopping of antibiotics            Stopping of antibiotics           strongly encouraged.               strongly encouraged.        ----------------------------     ------------------------------           PCT level decrease by               PCT < 0.25 ng/mL           >= 80% from peak PCT           OR PCT 0.25 - 0.5 ng/mL          Stopping of antibiotics  encouraged.         Stopping of antibiotics               encouraged.        ----------------------------     ------------------------------           PCT level decrease by              PCT >= 0.25 ng/mL           < 80% from peak PCT            AND PCT >= 0.5 ng/mL            Continuing antibiotics                                                  encouraged.           Continuing antibiotics                encouraged.        ----------------------------     ------------------------------         PCT level increase compared          PCT > 0.5 ng/mL             with peak PCT AND              PCT >= 0.5 ng/mL             Escalation of antibiotics                                              strongly encouraged.          Escalation of antibiotics            strongly encouraged.    Imaging / Studies: Dg Colon W/water Sol Cm  08/12/2014   CLINICAL DATA:  Evaluate for distal bowel obstruction  EXAM: COLON  WITH WATER SOLUTION CONTRAST  COMPARISON:  08/10/2014  FINDINGS: On the scout radiograph there is enteric contrast material identified within the colon and distal small bowel loops. No abnormal small or large bowel dilatation identified. The enema tip was carefully placed in the rectum and retaining balloon was insufflated. Under gravity, the rectum and distal colon was opacified in a retrograde fashion. Contrast opacification to the proximal sigmoid colon was achieved. No obstructing lesion identified within the sigmoid colon and rectum. Post evacuation images were obtained which demonstrate decompression of the rectum, sigmoid colon.  IMPRESSION: 1. No evidence for obstruction of the sigmoid colon or rectum. 2. 3. These results were called by telephone at the time of interpretation on 08/12/2014 at 5:15 pm to Dr. Dr. Donne Hazel, who verbally acknowledged these results.   Electronically Signed   By: Kerby Moors M.D.   On: 08/12/2014 20:16    Medications / Allergies:  Scheduled Meds: . aspirin EC  81 mg Oral Daily  .  HYDROmorphone (DILAUDID) injection  0.5 mg Intravenous Once  . Influenza vac split quadrivalent PF  0.5 mL Intramuscular Tomorrow-1000  . piperacillin-tazobactam (ZOSYN)  IV  3.375 g Intravenous 3 times per day  . saccharomyces boulardii  250 mg Oral BID  . vancomycin  500 mg Intravenous Q12H  Continuous Infusions: . sodium chloride 125 mL/hr at 08/13/14 0236   PRN Meds:.acetaminophen, ALPRAZolam, HYDROmorphone (DILAUDID) injection, ondansetron (ZOFRAN) IV, ondansetron  Antibiotics: Anti-infectives   Start     Dose/Rate Route Frequency Ordered Stop   08/12/14 1800  vancomycin (VANCOCIN) 500 mg in sodium chloride 0.9 % 100 mL IVPB     500 mg 100 mL/hr over 60 Minutes Intravenous Every 12 hours 08/12/14 1649     08/11/14 2200  piperacillin-tazobactam (ZOSYN) IVPB 3.375 g     3.375 g 12.5 mL/hr over 240 Minutes Intravenous 3 times per day 08/11/14 1610     08/11/14 1700   piperacillin-tazobactam (ZOSYN) IVPB 3.375 g     3.375 g 100 mL/hr over 30 Minutes Intravenous  Once 08/11/14 1610 08/11/14 1853   08/11/14 0400  ciprofloxacin (CIPRO) IVPB 400 mg  Status:  Discontinued     400 mg 200 mL/hr over 60 Minutes Intravenous Every 12 hours 08/11/14 0206 08/11/14 1606   08/11/14 0400  metroNIDAZOLE (FLAGYL) IVPB 500 mg  Status:  Discontinued     500 mg 100 mL/hr over 60 Minutes Intravenous Every 8 hours 08/11/14 0206 08/11/14 1606        Assessment/Plan Colitis ? Infectious vs IBD  Abdominal pain  Diarrhea  Liver lesion water soluble enema did not show an obstruction as previously evident on the colonoscopy. Abdomen is soft, no evidence of peritonitis, WBC is trending down.  We recommend continuing with IV antibiotics.  She does not appear to have necrotic bowel, does not appear toxic and therefore no surgical indicatons. Would recommend repeating a colonoscopy once she recovers from the colitis.  Further recommendations per GI and primary team.  Will follow along.   Erby Pian, [facility] Surgery Pager [phone]) For consults and floor pages call [phone]([id number])  08/13/2014 11:31 AM

## 2014-08-14 ENCOUNTER — Encounter (HOSPITAL_COMMUNITY): Payer: Medicare Other | Admitting: Anesthesiology

## 2014-08-14 ENCOUNTER — Other Ambulatory Visit: Payer: Self-pay | Admitting: *Deleted

## 2014-08-14 ENCOUNTER — Encounter (HOSPITAL_COMMUNITY): Admission: EM | Disposition: A | Discharge status: Home Health | Payer: Self-pay | Source: Home / Self Care

## 2014-08-14 ENCOUNTER — Inpatient Hospital Stay (HOSPITAL_COMMUNITY): Payer: Medicare Other | Admitting: Anesthesiology

## 2014-08-14 ENCOUNTER — Encounter (HOSPITAL_COMMUNITY): Payer: Self-pay | Admitting: Anesthesiology

## 2014-08-14 DIAGNOSIS — F418 Other specified anxiety disorders: Secondary | ICD-10-CM

## 2014-08-14 HISTORY — PX: COLECTOMY: SHX59

## 2014-08-14 HISTORY — PX: LAPAROTOMY: SHX154

## 2014-08-14 LAB — CBC
HCT: 33.4 % — ABNORMAL LOW (ref 36.0–46.0)
HEMOGLOBIN: 11 g/dL — AB (ref 12.0–15.0)
MCH: 32 pg (ref 26.0–34.0)
MCHC: 32.9 g/dL (ref 30.0–36.0)
MCV: 97.1 fL (ref 78.0–100.0)
Platelets: 397 10*3/uL (ref 150–400)
RBC: 3.44 MIL/uL — ABNORMAL LOW (ref 3.87–5.11)
RDW: 15.1 % (ref 11.5–15.5)
WBC: 20 10*3/uL — AB (ref 4.0–10.5)

## 2014-08-14 LAB — BASIC METABOLIC PANEL
ANION GAP: 14 (ref 5–15)
BUN: 9 mg/dL (ref 6–23)
CHLORIDE: 101 meq/L (ref 96–112)
CO2: 22 mEq/L (ref 19–32)
Calcium: 7.9 mg/dL — ABNORMAL LOW (ref 8.4–10.5)
Creatinine, Ser: 0.72 mg/dL (ref 0.50–1.10)
GFR calc Af Amer: >90 mL/min (ref >90)
GFR calc non Af Amer: 86 mL/min — ABNORMAL LOW (ref >90)
Glucose, Bld: 91 mg/dL (ref 70–99)
Potassium: 3.9 mEq/L (ref 3.7–5.3)
SODIUM: 137 meq/L (ref 137–147)

## 2014-08-14 LAB — PROTIME-INR
INR: 1.17 (ref 0.00–1.49)
Prothrombin Time: 14.9 seconds (ref 11.6–15.2)

## 2014-08-14 SURGERY — LAPAROTOMY, EXPLORATORY
Anesthesia: General

## 2014-08-14 MED ORDER — HYDROMORPHONE HCL 1 MG/ML IJ SOLN
INTRAMUSCULAR | Status: AC
  Filled 2014-08-14: qty 1

## 2014-08-14 MED ORDER — PROPOFOL 10 MG/ML IV BOLUS
INTRAVENOUS | Status: DC | PRN
  Administered 2014-08-14: 150 mg via INTRAVENOUS

## 2014-08-14 MED ORDER — SUCCINYLCHOLINE CHLORIDE 20 MG/ML IJ SOLN
INTRAMUSCULAR | Status: AC
  Filled 2014-08-14: qty 1

## 2014-08-14 MED ORDER — ARTIFICIAL TEARS OP OINT
TOPICAL_OINTMENT | OPHTHALMIC | Status: AC
  Filled 2014-08-14: qty 3.5

## 2014-08-14 MED ORDER — NALOXONE HCL 0.4 MG/ML IJ SOLN: 0.4000 mg | INTRAMUSCULAR | Status: DC | PRN

## 2014-08-14 MED ORDER — HYDROMORPHONE HCL 1 MG/ML IJ SOLN
0.2500 mg | INTRAMUSCULAR | Status: DC | PRN
  Administered 2014-08-14 (×4): 0.5 mg via INTRAVENOUS

## 2014-08-14 MED ORDER — GLYCOPYRROLATE 0.2 MG/ML IJ SOLN
INTRAMUSCULAR | Status: DC | PRN
  Administered 2014-08-14: 0.6 mg via INTRAVENOUS

## 2014-08-14 MED ORDER — ONDANSETRON HCL 4 MG/2ML IJ SOLN
4.0000 mg | Freq: Once | INTRAMUSCULAR | Status: AC
  Administered 2014-08-14: 4 mg via INTRAVENOUS

## 2014-08-14 MED ORDER — ROCURONIUM BROMIDE 100 MG/10ML IV SOLN
INTRAVENOUS | Status: DC | PRN
  Administered 2014-08-14: 25 mg via INTRAVENOUS
  Administered 2014-08-14: 5 mg via INTRAVENOUS

## 2014-08-14 MED ORDER — DIPHENHYDRAMINE HCL 50 MG/ML IJ SOLN: 12.5000 mg | Freq: Four times a day (QID) | INTRAMUSCULAR | Status: DC | PRN

## 2014-08-14 MED ORDER — HYDROMORPHONE 0.3 MG/ML IV SOLN
INTRAVENOUS | Status: DC
  Administered 2014-08-14: 23:00:00 via INTRAVENOUS
  Administered 2014-08-15: 0.3 mg via INTRAVENOUS
  Administered 2014-08-15: 2.45 mg via INTRAVENOUS
  Administered 2014-08-15: 08:00:00 via INTRAVENOUS
  Administered 2014-08-15: 1.2 mg via INTRAVENOUS
  Administered 2014-08-15: 3.48 mg via INTRAVENOUS
  Administered 2014-08-15: 2.4 mg via INTRAVENOUS
  Administered 2014-08-16: 0.9 mg via INTRAVENOUS
  Administered 2014-08-16: 0.3 mg via INTRAVENOUS
  Administered 2014-08-16: 0.9 mg via INTRAVENOUS
  Administered 2014-08-16: 1.2 mg via INTRAVENOUS
  Administered 2014-08-16: 1.5 mg via INTRAVENOUS
  Administered 2014-08-16: 2.4 mg via INTRAVENOUS
  Administered 2014-08-16: 0.3 mg via INTRAVENOUS
  Filled 2014-08-14 (×3): qty 25

## 2014-08-14 MED ORDER — SUCCINYLCHOLINE CHLORIDE 20 MG/ML IJ SOLN
INTRAMUSCULAR | Status: DC | PRN
  Administered 2014-08-14: 100 mg via INTRAVENOUS

## 2014-08-14 MED ORDER — DIPHENHYDRAMINE HCL 50 MG/ML IJ SOLN
12.5000 mg | Freq: Four times a day (QID) | INTRAMUSCULAR | Status: DC | PRN
  Filled 2014-08-14: qty 0.25

## 2014-08-14 MED ORDER — NEOSTIGMINE METHYLSULFATE 10 MG/10ML IV SOLN
INTRAVENOUS | Status: AC
  Filled 2014-08-14: qty 1

## 2014-08-14 MED ORDER — CHLORHEXIDINE GLUCONATE CLOTH 2 % EX PADS
6.0000 | MEDICATED_PAD | Freq: Once | CUTANEOUS | Status: AC
  Administered 2014-08-14: 6 via TOPICAL

## 2014-08-14 MED ORDER — LACTATED RINGERS IV SOLN
INTRAVENOUS | Status: DC
  Administered 2014-08-14: 50 mL/h via INTRAVENOUS

## 2014-08-14 MED ORDER — ROCURONIUM BROMIDE 50 MG/5ML IV SOLN
INTRAVENOUS | Status: AC
  Filled 2014-08-14: qty 1

## 2014-08-14 MED ORDER — MORPHINE SULFATE (PF) 1 MG/ML IV SOLN
INTRAVENOUS | Status: DC
  Administered 2014-08-14: 1.5 mg via INTRAVENOUS
  Administered 2014-08-14: 12 mg via INTRAVENOUS

## 2014-08-14 MED ORDER — ONDANSETRON HCL 4 MG/2ML IJ SOLN
INTRAMUSCULAR | Status: AC
Start: 2014-08-14 — End: 2014-08-15
  Filled 2014-08-14: qty 2

## 2014-08-14 MED ORDER — GLYCOPYRROLATE 0.2 MG/ML IJ SOLN
INTRAMUSCULAR | Status: AC
  Filled 2014-08-14: qty 2

## 2014-08-14 MED ORDER — HEPARIN SODIUM (PORCINE) 5000 UNIT/ML IJ SOLN
5000.0000 [IU] | Freq: Once | INTRAMUSCULAR | Status: AC
  Administered 2014-08-14: 5000 [IU] via SUBCUTANEOUS
  Filled 2014-08-14: qty 1

## 2014-08-14 MED ORDER — DEXAMETHASONE SODIUM PHOSPHATE 4 MG/ML IJ SOLN
INTRAMUSCULAR | Status: DC | PRN
  Administered 2014-08-14: 4 mg via INTRAVENOUS

## 2014-08-14 MED ORDER — ONDANSETRON HCL 4 MG/2ML IJ SOLN
INTRAMUSCULAR | Status: DC | PRN
Start: 2014-08-14 — End: 2014-08-14
  Administered 2014-08-14: 4 mg via INTRAVENOUS

## 2014-08-14 MED ORDER — PROPOFOL 10 MG/ML IV BOLUS
INTRAVENOUS | Status: AC
  Filled 2014-08-14: qty 20

## 2014-08-14 MED ORDER — FENTANYL CITRATE 0.05 MG/ML IJ SOLN
INTRAMUSCULAR | Status: AC
  Filled 2014-08-14: qty 5

## 2014-08-14 MED ORDER — DIPHENHYDRAMINE HCL 12.5 MG/5ML PO ELIX: 12.5000 mg | ORAL_SOLUTION | Freq: Four times a day (QID) | ORAL | Status: DC | PRN

## 2014-08-14 MED ORDER — SODIUM CHLORIDE 0.9 % IJ SOLN
INTRAMUSCULAR | Status: AC
  Filled 2014-08-14: qty 10

## 2014-08-14 MED ORDER — FENTANYL CITRATE 0.05 MG/ML IJ SOLN
INTRAMUSCULAR | Status: DC | PRN
  Administered 2014-08-14: 150 ug via INTRAVENOUS
  Administered 2014-08-14 (×2): 50 ug via INTRAVENOUS

## 2014-08-14 MED ORDER — ONDANSETRON HCL 4 MG/2ML IJ SOLN: 4.0000 mg | Freq: Four times a day (QID) | INTRAMUSCULAR | Status: DC | PRN

## 2014-08-14 MED ORDER — CEFOTETAN DISODIUM 2 G IJ SOLR
2.0000 g | INTRAMUSCULAR | Status: AC
  Administered 2014-08-14: 2 g via INTRAVENOUS
  Filled 2014-08-14: qty 2

## 2014-08-14 MED ORDER — NEOSTIGMINE METHYLSULFATE 10 MG/10ML IV SOLN
INTRAVENOUS | Status: DC | PRN
  Administered 2014-08-14: 4 mg via INTRAVENOUS

## 2014-08-14 MED ORDER — NALOXONE HCL 0.4 MG/ML IJ SOLN
0.4000 mg | INTRAMUSCULAR | Status: DC | PRN
  Filled 2014-08-14: qty 1

## 2014-08-14 MED ORDER — LACTATED RINGERS IV SOLN
INTRAVENOUS | Status: DC | PRN
  Administered 2014-08-14 (×2): via INTRAVENOUS

## 2014-08-14 MED ORDER — MORPHINE SULFATE (PF) 1 MG/ML IV SOLN
INTRAVENOUS | Status: AC
  Filled 2014-08-14: qty 25

## 2014-08-14 MED ORDER — LIDOCAINE HCL (CARDIAC) 20 MG/ML IV SOLN
INTRAVENOUS | Status: DC | PRN
  Administered 2014-08-14: 70 mg via INTRAVENOUS

## 2014-08-14 MED ORDER — EPHEDRINE SULFATE 50 MG/ML IJ SOLN
INTRAMUSCULAR | Status: AC
  Filled 2014-08-14: qty 1

## 2014-08-14 MED ORDER — SODIUM CHLORIDE 0.9 % IJ SOLN: 9.0000 mL | INTRAMUSCULAR | Status: DC | PRN

## 2014-08-14 MED ORDER — 0.9 % SODIUM CHLORIDE (POUR BTL) OPTIME
TOPICAL | Status: DC | PRN
  Administered 2014-08-14 (×3): 1000 mL

## 2014-08-14 MED ORDER — DIPHENHYDRAMINE HCL 12.5 MG/5ML PO ELIX
12.5000 mg | ORAL_SOLUTION | Freq: Four times a day (QID) | ORAL | Status: DC | PRN
  Filled 2014-08-14: qty 5

## 2014-08-14 MED ORDER — ONDANSETRON HCL 4 MG/2ML IJ SOLN
INTRAMUSCULAR | Status: AC
  Filled 2014-08-14: qty 2

## 2014-08-14 MED ORDER — PHENYLEPHRINE 40 MCG/ML (10ML) SYRINGE FOR IV PUSH (FOR BLOOD PRESSURE SUPPORT)
PREFILLED_SYRINGE | INTRAVENOUS | Status: AC
  Filled 2014-08-14: qty 10

## 2014-08-14 SURGICAL SUPPLY — 73 items
BLADE SURG ROTATE 9660 (MISCELLANEOUS) IMPLANT
CANISTER SUCTION 2500CC (MISCELLANEOUS) ×4 IMPLANT
CANISTER WOUND CARE 500ML ATS (WOUND CARE) ×2 IMPLANT
CHLORAPREP W/TINT 26ML (MISCELLANEOUS) ×4 IMPLANT
COVER MAYO STAND STRL (DRAPES) ×8 IMPLANT
COVER SURGICAL LIGHT HANDLE (MISCELLANEOUS) ×4 IMPLANT
DRAPE LAPAROSCOPIC ABDOMINAL (DRAPES) ×4 IMPLANT
DRAPE PROXIMA HALF (DRAPES) ×8 IMPLANT
DRAPE UTILITY 15X26 W/TAPE STR (DRAPE) ×14 IMPLANT
DRAPE WARM FLUID 44X44 (DRAPE) ×4 IMPLANT
DRSG OPSITE POSTOP 4X10 (GAUZE/BANDAGES/DRESSINGS) IMPLANT
DRSG OPSITE POSTOP 4X8 (GAUZE/BANDAGES/DRESSINGS) IMPLANT
DRSG VAC ATS MED SENSATRAC (GAUZE/BANDAGES/DRESSINGS) ×2 IMPLANT
DURAPREP 26ML APPLICATOR (WOUND CARE) ×2 IMPLANT
ELECT BLADE 6.5 EXT (BLADE) ×4 IMPLANT
ELECT CAUTERY BLADE 6.4 (BLADE) ×8 IMPLANT
ELECT REM PT RETURN 9FT ADLT (ELECTROSURGICAL) ×4
ELECTRODE REM PT RTRN 9FT ADLT (ELECTROSURGICAL) ×2 IMPLANT
GLOVE BIO SURGEON STRL SZ 6.5 (GLOVE) ×1 IMPLANT
GLOVE BIO SURGEON STRL SZ7 (GLOVE) ×10 IMPLANT
GLOVE BIO SURGEON STRL SZ7.5 (GLOVE) ×4 IMPLANT
GLOVE BIO SURGEON STRL SZ8 (GLOVE) ×2 IMPLANT
GLOVE BIO SURGEONS STRL SZ 6.5 (GLOVE) ×1
GLOVE BIOGEL PI IND STRL 7.0 (GLOVE) IMPLANT
GLOVE BIOGEL PI IND STRL 7.5 (GLOVE) ×4 IMPLANT
GLOVE BIOGEL PI IND STRL 8 (GLOVE) IMPLANT
GLOVE BIOGEL PI INDICATOR 7.0 (GLOVE) ×4
GLOVE BIOGEL PI INDICATOR 7.5 (GLOVE) ×10
GLOVE BIOGEL PI INDICATOR 8 (GLOVE) ×2
GOWN PREVENTION PLUS XLARGE (GOWN DISPOSABLE) ×2 IMPLANT
GOWN STRL REUS W/ TWL LRG LVL3 (GOWN DISPOSABLE) ×12 IMPLANT
GOWN STRL REUS W/TWL LRG LVL3 (GOWN DISPOSABLE) ×24
KIT BASIN OR (CUSTOM PROCEDURE TRAY) ×4 IMPLANT
KIT OSTOMY DRAINABLE 2.75 STR (WOUND CARE) ×2 IMPLANT
KIT ROOM TURNOVER OR (KITS) ×4 IMPLANT
LEGGING LITHOTOMY PAIR STRL (DRAPES) IMPLANT
LIGASURE IMPACT 36 18CM CVD LR (INSTRUMENTS) ×2 IMPLANT
NS IRRIG 1000ML POUR BTL (IV SOLUTION) ×12 IMPLANT
PACK GENERAL/GYN (CUSTOM PROCEDURE TRAY) ×4 IMPLANT
PAD ARMBOARD 7.5X6 YLW CONV (MISCELLANEOUS) ×4 IMPLANT
PENCIL BUTTON HOLSTER BLD 10FT (ELECTRODE) ×4 IMPLANT
RELOAD PROXIMATE 75MM BLUE (ENDOMECHANICALS) ×4 IMPLANT
RELOAD STAPLE 75 3.8 BLU REG (ENDOMECHANICALS) IMPLANT
SPECIMEN JAR LARGE (MISCELLANEOUS) IMPLANT
SPONGE LAP 18X18 X RAY DECT (DISPOSABLE) IMPLANT
STAPLER CUT CVD 40MM BLUE (STAPLE) ×2 IMPLANT
STAPLER PROXIMATE 75MM BLUE (STAPLE) ×2 IMPLANT
STAPLER VISISTAT 35W (STAPLE) ×4 IMPLANT
SUCTION POOLE TIP (SUCTIONS) ×4 IMPLANT
SURGILUBE 2OZ TUBE FLIPTOP (MISCELLANEOUS) IMPLANT
SUT PDS AB 1 TP1 96 (SUTURE) ×8 IMPLANT
SUT PROLENE 2 0 CT2 30 (SUTURE) ×4 IMPLANT
SUT PROLENE 2 0 KS (SUTURE) IMPLANT
SUT SILK 2 0 (SUTURE) ×4
SUT SILK 2 0 SH CR/8 (SUTURE) ×4 IMPLANT
SUT SILK 2 0 TIES 10X30 (SUTURE) ×4 IMPLANT
SUT SILK 2-0 18XBRD TIE 12 (SUTURE) ×2 IMPLANT
SUT SILK 3 0 (SUTURE) ×4
SUT SILK 3 0 SH CR/8 (SUTURE) ×4 IMPLANT
SUT SILK 3 0 TIES 10X30 (SUTURE) ×4 IMPLANT
SUT SILK 3-0 18XBRD TIE 12 (SUTURE) ×2 IMPLANT
SUT VIC AB 3-0 SH 18 (SUTURE) ×2 IMPLANT
SUT VIC AB 3-0 SH 27 (SUTURE)
SUT VIC AB 3-0 SH 27X BRD (SUTURE) IMPLANT
SYR BULB IRRIGATION 50ML (SYRINGE) ×4 IMPLANT
TOWEL OR 17X26 10 PK STRL BLUE (TOWEL DISPOSABLE) ×8 IMPLANT
TRAY FOLEY CATH 14FRSI W/METER (CATHETERS) IMPLANT
TRAY FOLEY CATH 16FRSI W/METER (SET/KITS/TRAYS/PACK) ×2 IMPLANT
TRAY PROCTOSCOPIC FIBER OPTIC (SET/KITS/TRAYS/PACK) IMPLANT
TUBE CONNECTING 12'X1/4 (SUCTIONS) ×1
TUBE CONNECTING 12X1/4 (SUCTIONS) ×3 IMPLANT
WATER STERILE IRR 1000ML POUR (IV SOLUTION) IMPLANT
YANKAUER SUCT BULB TIP NO VENT (SUCTIONS) ×4 IMPLANT

## 2014-08-14 NOTE — Telephone Encounter (Signed)
Pharmacy faxed request for refill- 08/12/2014 pended medication please advise.

## 2014-08-14 NOTE — Progress Notes (Signed)
[facility] PROGRESS NOTE  Brianna Duncan YQI:[id number] DOB: [birth year] DOA: 08/10/2014 PCP: Wendie Agreste, MD  Brief Narrative from prior PN: Patient is a 68 y.o. female with history of hypertension, generalized anxiety, osteoporosis, and liver lesions status post biopsy came to the ED today because of severe abdominal pain and diarrhea. CT abdomen showed acute colitis. She is followed closely by her gastroenterologist, Dr Earlean Shawl.  On 9/14, patient started spiking fever, 102.9, SIRS with leukocytosis and tachycardia. Gastroenterology was consulted and patient was seen by her own gastroenterologist, Dr. Earlean Shawl.    Assessment/Plan: Principal Problem:   Acute colitis - Cdiff negative, GI pathogen panel pending, stool cx pending  - Continue IV Zosyn, blood cultures negative to date, florastor  - Continue clears, IV fluids and pain control  - General surgery on board. Pt to OR for further evaluation and recommendations from general surgery  Active Problems:   Unspecified essential hypertension - continue to hold amlodipine    Liver mass - General surgery on board and considering further work up options. Please refer to their note for details.    Chronic diastolic heart failure - stable and compensated currently.   Code Status: full Family Communication: None at bedside Disposition Plan: Pending further work up and recommendations.   Consultants:  General surgery: Dr. Donne Hazel  Procedures:  None  Antibiotics:  Vancomycin and Zosyn  HPI/Subjective: Pt has no new complaints. No acute issues reported overnight. Pain tolerable with pain medications.  Objective: Filed Vitals:   08/14/14 1035  BP: 108/68  Pulse: 101  Temp: 99.1 F (37.3 C)  Resp: 20    Intake/Output Summary (Last 24 hours) at 08/14/14 1351 Last data filed at 08/14/14 0900  Gross per 24 hour  Intake   1400 ml  Output      0 ml  Net   1400 ml   Filed Weights   08/11/14 0208   Weight: 61.1 kg (134 lb 11.2 oz)    Exam:   General:  Pt in nad, alert and awake  Cardiovascular: rrr, no mrg  Respiratory: cta bl, no wheezes  Abdomen: soft, generalized discomfort with palpation, no guarding no rebound tenderness  Musculoskeletal: no cyanosis or clubbing   Data Reviewed: Basic Metabolic Panel:  Recent Labs Lab 08/10/14 1915 08/11/14 0731 08/12/14 0458 08/13/14 0650 08/14/14 0546  NA 135* 134* 136* 134* 137  K 4.2 4.2 4.1 3.6* 3.9  CL 98 101 103 102 101  CO2 23 22 22 22 22   GLUCOSE 113* 148* 102* 84 91  BUN 11 11 10 10 9   CREATININE 1.22* 1.09 0.96 0.76 0.72  CALCIUM 9.6 8.1* 7.8* 7.5* 7.9*   Liver Function Tests:  Recent Labs Lab 08/10/14 1915 08/11/14 0731  AST 35 24  ALT 17 13  ALKPHOS 162* 119*  BILITOT 0.4 0.5  PROT 7.2 5.6*  ALBUMIN 3.3* 2.4*    Recent Labs Lab 08/10/14 1915  LIPASE 17   No results found for this basename: AMMONIA,  in the last 168 hours CBC:  Recent Labs Lab 08/10/14 1915 08/11/14 0731 08/12/14 0458 08/13/14 0650 08/14/14 0546  WBC 12.7* 20.8* 20.1* 13.9* 20.0*  NEUTROABS 8.5*  --   --   --   --   HGB 13.6 11.1* 10.5* 10.0* 11.0*  HCT 40.9 33.6* 32.3* 30.7* 33.4*  MCV 96.7 97.7 95.3 95.0 97.1  PLT 389 287 255 255 397   Cardiac Enzymes: No results found for this basename: CKTOTAL, CKMB, CKMBINDEX, TROPONINI,  in the  last 168 hours BNP (last 3 results)  Recent Labs  07/11/14 0920  PROBNP 30.2   CBG: No results found for this basename: GLUCAP,  in the last 168 hours  Recent Results (from the past 240 hour(s))  CLOSTRIDIUM DIFFICILE BY PCR     Status: None   Collection Time    08/11/14  2:06 AM      Result Value Ref Range Status   C difficile by pcr NEGATIVE  NEGATIVE Final  CULTURE, BLOOD (ROUTINE X 2)     Status: None   Collection Time    08/11/14  4:34 PM      Result Value Ref Range Status   Specimen Description BLOOD RIGHT ARM   Final   Special Requests BOTTLES DRAWN AEROBIC AND  ANAEROBIC B 10CC R 5CC   Final   Culture  Setup Time     Final   Value: 08/11/2014 22:28     Performed at [facility]   Culture     Final   Value:        BLOOD CULTURE RECEIVED NO GROWTH TO DATE CULTURE WILL BE HELD FOR 5 DAYS BEFORE ISSUING A FINAL NEGATIVE REPORT     Performed at [facility]   Report Status PENDING   Incomplete  CULTURE, BLOOD (ROUTINE X 2)     Status: None   Collection Time    08/11/14  4:44 PM      Result Value Ref Range Status   Specimen Description BLOOD LEFT HAND   Final   Special Requests BOTTLES DRAWN AEROBIC AND ANAEROBIC B 10CC R 6CC   Final   Culture  Setup Time     Final   Value: 08/11/2014 22:34     Performed at [facility]   Culture     Final   Value:        BLOOD CULTURE RECEIVED NO GROWTH TO DATE CULTURE WILL BE HELD FOR 5 DAYS BEFORE ISSUING A FINAL NEGATIVE REPORT     Note: Culture results may be compromised due to an excessive volume of blood received in culture bottles.     Performed at [facility]   Report Status PENDING   Incomplete  URINE CULTURE     Status: None   Collection Time    08/12/14  2:50 AM      Result Value Ref Range Status   Specimen Description URINE, CLEAN CATCH   Final   Special Requests NONE   Final   Culture  Setup Time     Final   Value: 08/12/2014 21:29     Performed at [facility] Count     Final   Value: 3,000 COLONIES/ML     Performed at [facility]   Culture     Final   Value: INSIGNIFICANT GROWTH     Performed at [facility]   Report Status 08/13/2014 FINAL   Final  SURGICAL PCR SCREEN     Status: None   Collection Time    08/12/14  8:49 PM      Result Value Ref Range Status   MRSA, PCR NEGATIVE  NEGATIVE Final   Staphylococcus aureus NEGATIVE  NEGATIVE Final   Comment:            The Xpert SA Assay (FDA     approved for NASAL specimens     in patients over 25 years of age),     is one  component of     a comprehensive surveillance      program.  Test performance has     been validated by [facility] for patients greater     than or equal to 74 year old.     It is not intended     to diagnose infection nor to     guide or monitor treatment.  STOOL CULTURE     Status: None   Collection Time    08/13/14  2:45 AM      Result Value Ref Range Status   Specimen Description STOOL   Final   Special Requests NONE   Final   Culture     Final   Value: Culture reincubated for better growth     Performed at [facility]   Report Status PENDING   Incomplete     Studies: Dg Colon W/water Sol Cm  08/12/2014   CLINICAL DATA:  Evaluate for distal bowel obstruction  EXAM: COLON WITH WATER SOLUTION CONTRAST  COMPARISON:  08/10/2014  FINDINGS: On the scout radiograph there is enteric contrast material identified within the colon and distal small bowel loops. No abnormal small or large bowel dilatation identified. The enema tip was carefully placed in the rectum and retaining balloon was insufflated. Under gravity, the rectum and distal colon was opacified in a retrograde fashion. Contrast opacification to the proximal sigmoid colon was achieved. No obstructing lesion identified within the sigmoid colon and rectum. Post evacuation images were obtained which demonstrate decompression of the rectum, sigmoid colon.  IMPRESSION: 1. No evidence for obstruction of the sigmoid colon or rectum. 2. 3. These results were called by telephone at the time of interpretation on 08/12/2014 at 5:15 pm to Dr. Dr. Donne Hazel, who verbally acknowledged these results.   Electronically Signed   By: Kerby Moors M.D.   On: 08/12/2014 20:16    Scheduled Meds: . cefoTEtan (CEFOTAN) 2 GM IVPB  2 g Intravenous On Call to OR  . [facility] HOLD]  HYDROmorphone (DILAUDID) injection  0.5 mg Intravenous Once  . [MAR HOLD] Influenza vac split quadrivalent PF  0.5 mL Intramuscular Tomorrow-1000  . [MAR HOLD] piperacillin-tazobactam (ZOSYN)  IV  3.375 g Intravenous  3 times per day  . [MAR HOLD] saccharomyces boulardii  250 mg Oral BID  . [facility] HOLD] vancomycin  500 mg Intravenous Q12H   Continuous Infusions: . sodium chloride 125 mL/hr at 08/14/14 1238  . lactated ringers 50 mL/hr (08/14/14 1319)     Time spent: > 35 minutes    Velvet Bathe  [facility] Pager [phone] If 7PM-7AM, please contact night-coverage at [email or web address], password [facility] 08/14/2014, 1:51 PM  LOS: 4 days

## 2014-08-14 NOTE — Consult Note (Addendum)
WOC ostomy consult note WOC requested for presurgical stoma site marking for possible colostomy today.  Assessed patient while lying and sitting.  Mark placed within rectus muscles, in line of vision, in area free from folds.  Site placed in left upper quad related to a deep crease which occurs when patient is sitting upright, any lower areas should be avoided.  Mark 2 cm above umbilicus and 6 cm to the left.  Educational materials left at bedside, demonstrated pouch appearance, and [place] team will plan to follow post-op for teaching sessions if colostomy surgery is performed. Julien Girt MSN, RN, [place], [place], [place]

## 2014-08-14 NOTE — Transfer of Care (Signed)
Immediate Anesthesia Transfer of Care Note  Patient: Brianna Duncan  Procedure(s) Performed: Procedure(s): EXPLORATORY LAPAROTOMY (N/A) TOTAL COLECTOMY/COLOSTOMY  Patient Location: PACU  Anesthesia Type:General  Level of Consciousness: awake, alert  and oriented  Airway & Oxygen Therapy: Patient Spontanous Breathing and Patient connected to face mask oxygen  Post-op Assessment: Report given to PACU RN  Post vital signs: Reviewed and stable  Complications: No apparent anesthesia complications

## 2014-08-14 NOTE — Progress Notes (Signed)
Subjective: More pain meds, no better  Objective: Vital signs in last 24 hours: Temp:  [98.5 F (36.9 C)-102.4 F (39.1 C)] 98.6 F (37 C) (09/17 0554) Pulse Rate:  [99-116] 109 (09/17 0554) Resp:  [19-20] 19 (09/17 0554) BP: (94-115)/(50-54) 100/50 mmHg (09/17 0554) SpO2:  [93 %-94 %] 94 % (09/17 0554) Last BM Date: 08/13/14  Intake/Output from previous day: 09/16 0701 - 09/17 0700 In: 1400 [I.V.:1250; IV Piggyback:150] Out: -  Intake/Output this shift:    General appearance: fatigued GI: local peritonitis left abdomen worse, diffuse mild tenderness  Lab Results:   Recent Labs  08/13/14 0650 08/14/14 0546  WBC 13.9* 20.0*  HGB 10.0* 11.0*  HCT 30.7* 33.4*  PLT 255 397   BMET  Recent Labs  08/13/14 0650 08/14/14 0546  NA 134* 137  K 3.6* 3.9  CL 102 101  CO2 22 22  GLUCOSE 84 91  BUN 10 9  CREATININE 0.76 0.72  CALCIUM 7.5* 7.9*   PT/INR  Recent Labs  08/12/14 1525 08/14/14 0546  LABPROT 18.7* 14.9  INR 1.56* 1.17   ABG No results found for this basename: PHART, PCO2, PO2, HCO3,  in the last 72 hours  Studies/Results: Dg Colon W/water Sol Cm  08/12/2014   CLINICAL DATA:  Evaluate for distal bowel obstruction  EXAM: COLON WITH WATER SOLUTION CONTRAST  COMPARISON:  08/10/2014  FINDINGS: On the scout radiograph there is enteric contrast material identified within the colon and distal small bowel loops. No abnormal small or large bowel dilatation identified. The enema tip was carefully placed in the rectum and retaining balloon was insufflated. Under gravity, the rectum and distal colon was opacified in a retrograde fashion. Contrast opacification to the proximal sigmoid colon was achieved. No obstructing lesion identified within the sigmoid colon and rectum. Post evacuation images were obtained which demonstrate decompression of the rectum, sigmoid colon.  IMPRESSION: 1. No evidence for obstruction of the sigmoid colon or rectum. 2. 3. These results  were called by telephone at the time of interpretation on 08/12/2014 at 5:15 pm to Dr. Dr. Donne Hazel, who verbally acknowledged these results.   Electronically Signed   By: Kerby Moors M.D.   On: 08/12/2014 20:16    Anti-infectives: Anti-infectives   Start     Dose/Rate Route Frequency Ordered Stop   08/12/14 1800  vancomycin (VANCOCIN) 500 mg in sodium chloride 0.9 % 100 mL IVPB     500 mg 100 mL/hr over 60 Minutes Intravenous Every 12 hours 08/12/14 1649     08/11/14 2200  piperacillin-tazobactam (ZOSYN) IVPB 3.375 g     3.375 g 12.5 mL/hr over 240 Minutes Intravenous 3 times per day 08/11/14 1610     08/11/14 1700  piperacillin-tazobactam (ZOSYN) IVPB 3.375 g     3.375 g 100 mL/hr over 30 Minutes Intravenous  Once 08/11/14 1610 08/11/14 1853   08/11/14 0400  ciprofloxacin (CIPRO) IVPB 400 mg  Status:  Discontinued     400 mg 200 mL/hr over 60 Minutes Intravenous Every 12 hours 08/11/14 0206 08/11/14 1606   08/11/14 0400  metroNIDAZOLE (FLAGYL) IVPB 500 mg  Status:  Discontinued     500 mg 100 mL/hr over 60 Minutes Intravenous Every 8 hours 08/11/14 0206 08/11/14 1606      Assessment/Plan: ? Colitis  She is worse today with local peritonitis, elevated wbc.  I think she needs to go to or today.  Plan for elap with likely sigmoid or left colectomy for what I think is  either ischemic colitis or stricture (poss related to ischemia) and colostomy, will consider liver biopsy at that time also. Discussed risks of having negative laparotomy, infection, bleeding, transfusion, open wound, abscess, dvt/pe, arrythmia, mi, stroke among others.  We will proceed later today.  [facility] 08/14/2014

## 2014-08-14 NOTE — Anesthesia Preprocedure Evaluation (Addendum)
Anesthesia Evaluation  Patient identified by MRN, date of birth, ID band Patient awake    Reviewed: Allergy & Precautions, H&P , NPO status , Patient's Chart, lab work & pertinent test results, reviewed documented beta blocker date and time   Airway Mallampati: II TM Distance: >3 FB Neck ROM: Full    Dental  (+) Upper Dentures, Partial Lower   Pulmonary former smoker,  breath sounds clear to auscultation  Pulmonary exam normal       Cardiovascular hypertension, Rhythm:Regular Rate:Normal     Neuro/Psych Anxiety    GI/Hepatic History noted. CE GI history noted. CE   Endo/Other    Renal/GU negative Renal ROS     Musculoskeletal   Abdominal Normal abdominal exam  (+)   Peds  Hematology   Anesthesia Other Findings   Reproductive/Obstetrics                          Anesthesia Physical Anesthesia Plan  ASA: III  Anesthesia Plan: General   Post-op Pain Management:    Induction: Intravenous  Airway Management Planned: Oral ETT  Additional Equipment:   Intra-op Plan:   Post-operative Plan: Possible Post-op intubation/ventilation  Informed Consent: I have reviewed the patients History and Physical, chart, labs and discussed the procedure including the risks, benefits and alternatives for the proposed anesthesia with the patient or authorized representative who has indicated his/her understanding and acceptance.   Dental advisory given  Plan Discussed with: CRNA, Anesthesiologist and Surgeon  Anesthesia Plan Comments:         Anesthesia Quick Evaluation

## 2014-08-14 NOTE — Anesthesia Postprocedure Evaluation (Signed)
  Anesthesia Post-op Note  Patient: Brianna Duncan  Procedure(s) Performed: Procedure(s): EXPLORATORY LAPAROTOMY (N/A) TOTAL COLECTOMY/COLOSTOMY  Patient Location: PACU  Anesthesia Type:General  Level of Consciousness: awake  Airway and Oxygen Therapy: Patient Spontanous Breathing  Post-op Pain: mild  Post-op Assessment: Post-op Vital signs reviewed  Post-op Vital Signs: Reviewed  Last Vitals:  Filed Vitals:   08/14/14 1700  BP:   Pulse: 113  Temp:   Resp: 21    Complications: No apparent anesthesia complications

## 2014-08-14 NOTE — Op Note (Signed)
Preoperative diagnosis: peritonitis, ? Sigmoid colon stricture Postoperative diagnosis: perforated sigmoid colon Procedure: 1. Sigmoid colectomy 2. End sigmoid colostomy 3. Vac placement 20x2 cm wound  4. Mobilization splenic flexure Surgeon: Dr Serita Grammes Asst: Dr Georganna Skeans Anesthesia: General EBL: minimal Complications: none Drains: none Specimen: sigmoid colon stitch proximal Sponge and needle count correct  Disposition to recovery stable  Indications: This is a 23 yof who has had several month history of abdominal pain undergoing thorough workup. She has had endoscopy which apparently was unable to traverse the sigmoid colon.  She has had several ct scans and mr that has been concerning for liver mass.  This has undergone biopsy and looks as if she has cirrhosis. There is no mass on latest imaging.  She has gotten acutely worse today with pain, increased wbc and I counselled her on going to or for exploration due to peritonitis.  Procedure: After informed consent was obtained she was taken to or.  She was given cefotetan. She had scds in place. She was given subcutaneous heparin. She was placed under geta without complication. She had foley and ng tube placed. She was then prepped and draped in the standard sterile surgical fashion.  A surgical timeout was performed.  I made a midline incision and entered the peritoneal cavity without difficulty. There was murky fluid and some purulence in the left gutter.  I the placed a balfour retractor.  Her sigmoid colon was noted to be inflamed chronically and had small bowel adherent to it. When I was able to separate the small bowel I noted a perforation in the sigmoid colon that was draining stool.  The entire sigmoid was thickened.  I cannot really tell what the primary process is.  I elected to divide the colon at the superior rectum with a contour stapler.  I then took down the white line of toldt.  The ureter was deep to this.  i then  divided the colonic mesentery with a combination of the ligasure device and silk ligatures.  I also mobilized the entire splenic flexure. I then divided the left colon just proximal to sigmoid where it was healthy and over 5 cm away from abnormality.  This was then passed off the table. Copious irrigation was performed.  I obtained hemostasis.  The ng tube was in good position. I ran the entire bowel and stomach without other abnormality. The liver appeared fatty and I elected not to biopsy.  I then placed 2 2-0 prolene sutures at the top of the rectum and secured this to the left sidewall.  I then used the premarked colostomy site and made a hole.  I brought the colon through this and fit easily.  I then placed the omentum under the incision. I closed the fascia with #1 looped PDS.  I eventually placed a vac on open incision.  I then removed the top of colon and matured an end colostomy with 3-0 vicryl suture.  This was viable.  An appliance was placed. She was extubated and transferred to recovery stable.

## 2014-08-14 NOTE — Progress Notes (Signed)
Pt states morphine PCA not helping pain, daughter concerned that morphine makes patient confused and have hallucinations. Dr. Marlou Starks notified and new order for full dose dilaudid PCA. Pt and family educated. Dilaudid PCA started.

## 2014-08-15 LAB — BASIC METABOLIC PANEL WITH GFR
Anion gap: 17 — ABNORMAL HIGH (ref 5–15)
BUN: 15 mg/dL (ref 6–23)
CO2: 19 meq/L (ref 19–32)
Calcium: 7.1 mg/dL — ABNORMAL LOW (ref 8.4–10.5)
Chloride: 104 meq/L (ref 96–112)
Creatinine, Ser: 1.04 mg/dL (ref 0.50–1.10)
GFR calc Af Amer: 63 mL/min — ABNORMAL LOW
GFR calc non Af Amer: 54 mL/min — ABNORMAL LOW
Glucose, Bld: 120 mg/dL — ABNORMAL HIGH (ref 70–99)
Potassium: 3.9 meq/L (ref 3.7–5.3)
Sodium: 140 meq/L (ref 137–147)

## 2014-08-15 LAB — CBC
HCT: 29.7 % — ABNORMAL LOW (ref 36.0–46.0)
Hemoglobin: 9.7 g/dL — ABNORMAL LOW (ref 12.0–15.0)
MCH: 31.1 pg (ref 26.0–34.0)
MCHC: 32.7 g/dL (ref 30.0–36.0)
MCV: 95.2 fL (ref 78.0–100.0)
Platelets: 357 10*3/uL (ref 150–400)
RBC: 3.12 MIL/uL — ABNORMAL LOW (ref 3.87–5.11)
RDW: 15.2 % (ref 11.5–15.5)
WBC: 17.9 10*3/uL — ABNORMAL HIGH (ref 4.0–10.5)

## 2014-08-15 MED ORDER — SODIUM CHLORIDE 0.9 % IV BOLUS (SEPSIS)
500.0000 mL | Freq: Once | INTRAVENOUS | Status: AC
  Administered 2014-08-15: 500 mL via INTRAVENOUS

## 2014-08-15 MED ORDER — PHENOL 1.4 % MT LIQD
1.0000 | OROMUCOSAL | Status: DC | PRN
  Administered 2014-08-15: 1 via OROMUCOSAL
  Filled 2014-08-15 (×3): qty 177

## 2014-08-15 MED ORDER — LORAZEPAM 2 MG/ML IJ SOLN
0.5000 mg | INTRAMUSCULAR | Status: DC | PRN
  Administered 2014-08-15: 1 mg via INTRAVENOUS
  Administered 2014-08-16: 0.5 mg via INTRAVENOUS
  Administered 2014-08-17: 1 mg via INTRAVENOUS
  Administered 2014-08-17: 0.5 mg via INTRAVENOUS
  Administered 2014-08-17 – 2014-08-23 (×10): 1 mg via INTRAVENOUS
  Filled 2014-08-15 (×14): qty 1

## 2014-08-15 MED ORDER — SODIUM CHLORIDE 0.9 % IV BOLUS (SEPSIS): 500.0000 mL | Freq: Once | INTRAVENOUS | Status: DC

## 2014-08-15 NOTE — Progress Notes (Signed)
Doing OK.  Having some pain, but did not know where PCA button was.    Reinforced use of PCA.

## 2014-08-15 NOTE — Consult Note (Addendum)
WOC ostomy follow-up consult note Stoma type/location: C[facility]  Following for assessment and plan of care to abd wound. Colostomy surgery performed yesterday to left upper quad.  Current pouch intact with good seal. Stomal assessment/size: Stoma red and viable; visualized through pouch. Output No stool or flatus in pouch, small amt red liquid. Ostomy pouching: 2pc.  Education provided: Pt with NG and in pain.  Will plan to begin pouch changes and educational sessions on Mon when feeling better.    Vac intact to abd wound with good seal; scheduled to be changed on Sat.  [facility] nurse will not be available on the weekend but bedside nurse can change with CCS PA to assess wound during first post-op dressing change and then on a T/TH/Sat schedule. Julien Girt MSN, RN, [place], [place], [place]

## 2014-08-15 NOTE — Evaluation (Addendum)
Physical Therapy Evaluation Patient Details Name: Brianna Duncan MRN: [id number] DOB: [birth year] Today's Date: 08/15/2014   History of Present Illness  Pt. admitted 08/10/14 with abdominal pain, diarrhea,vomiting, weakness.  Pt. sound to have perforated sigmoid colon and underwent surgery on 08/14/14 .  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  History includes HTN, anxietym liver lesions  Clinical Impression  Pt. Presents to PT with a decrease in her usual level of independence of gait and functional mobility and will benefit from acute PT to progress her to mod I level in preparation for home with 24 hour assist.  If 24 hour assist not available, pt. May need in house rehab .  Will monitor for progress and update DC recommendations as appropriated    Follow Up Recommendations Home health PT;Supervision/Assistance - 24 hour;Supervision for mobility/OOB (will need 24 hour supervision/assistance)    Equipment Recommendations  None recommended by PT    Recommendations for Other Services       Precautions / Restrictions Precautions Precautions: Fall;Other (comment) (colostomy) Restrictions Weight Bearing Restrictions: No      Mobility  Bed Mobility Overal bed mobility:  (pt. presents in recliner)                Transfers Overall transfer level: Needs assistance Equipment used: Rolling walker (2 wheeled) Transfers: Sit to/from Stand Sit to Stand: Mod assist         General transfer comment: mod to min assist needed for sit<>stand from recliner (for power up)  Ambulation/Gait Ambulation/Gait assistance:  (pt. not able today)              Stairs            Wheelchair Mobility    Modified Rankin (Stroke Patients Only)       Balance Overall balance assessment: Needs assistance Sitting-balance support: Single extremity supported;Feet supported Sitting balance-Leahy Scale: Fair     Standing balance support: Bilateral upper extremity  supported;During functional activity Standing balance-Leahy Scale: Poor Standing balance comment: needed support of RW for standing balance                             Pertinent Vitals/Pain Pain Assessment: 0-10 Pain Score: 4  Pain Location: left upper abdomen Pain Descriptors / Indicators: Sore Pain Intervention(s): PCA encouraged;Limited activity within patient's tolerance;Other (comment) (RN Arbie Cookey made aware) Resting O2 sats on 3 L O2= 90% After standing, O2 sats 87% on 3 L O2.  RN in and adjusted to 4 L o2.  And monitoring sats    Home Living Family/patient expects to be discharged to:: Private residence Living Arrangements: Other relatives Available Help at Discharge: Family;Available 24 hours/day;Other (Comment) (sister lives in pt's home) Type of Home: House Home Access: [place]: [place]: Environmental consultant - 2 wheels;Cane - single point      Prior Function Level of Independence: Independent               Hand Dominance        Extremity/Trunk Assessment   Upper Extremity Assessment: Generalized weakness           Lower Extremity Assessment: Generalized weakness         Communication   Communication: No difficulties  Cognition Arousal/Alertness: Lethargic;Suspect due to medications Behavior During Therapy: [facility] for tasks assessed/performed Overall Cognitive Status: No family/caregiver present to determine baseline cognitive functioning  Memory:  (RN states daughter reports pt. has some menory issues)              General Comments      Exercises General Exercises - Lower Extremity Ankle Circles/Pumps: AROM;Both;10 reps;Seated      Assessment/Plan    PT Assessment Patient needs continued PT services  PT Diagnosis Difficulty walking;Generalized weakness;Acute pain   PT Problem List Decreased strength;Decreased activity tolerance;Decreased balance;Decreased mobility;Decreased knowledge of  use of DME;Decreased safety awareness;Pain  PT Treatment Interventions DME instruction;Gait training;Functional mobility training;Therapeutic activities;Therapeutic exercise;Balance training;Patient/family education   PT Goals (Current goals can be found in the Care Plan section) Acute Rehab PT Goals Patient Stated Goal: pt. could not provide her goal due to sleepiness PT Goal Formulation: With patient Time For Goal Achievement: 08/29/14 Potential to Achieve Goals: Good    Frequency Min 3X/week   Barriers to discharge Decreased caregiver support pt. states she helps take care of her sister that lives in the home.  It will need to be determined if daughter can assist pt. at time of DC vs. need for ST  SNF for rehab    Co-evaluation               End of Session Equipment Utilized During Treatment: Oxygen Activity Tolerance: Patient limited by fatigue;Other (comment);Patient limited by lethargy (limited by lethargy likely from pain med) Patient left: in chair;with call bell/phone within reach;with nursing/sitter in room;Other (comment) (nursing tech in room with pt.) Nurse Communication: Mobility status;Other (comment) (O2 sat monitor continues to alarm)         Time: 9518-8416 PT Time Calculation (min): 23 min   Charges:   PT Evaluation $Initial PT Evaluation Tier I: 1 Procedure PT Treatments $Therapeutic Activity: 8-22 mins   PT G Codes:          Ladona Ridgel 08/15/2014, 3:59 PM Gerlean Ren PT Acute Rehab Services [phone] [place] [phone]

## 2014-08-15 NOTE — Progress Notes (Signed)
Morphine PCA discontinued. 2.5mg  wasted in sharps with Clovis Pu, RN.

## 2014-08-15 NOTE — Care Management Note (Signed)
  Page 2 of 2   08/25/2014     10:46:54 AM CARE MANAGEMENT NOTE 08/25/2014  Patient:  Brianna Duncan, Brianna Duncan   Account Number:  [id number]  Date Initiated:  08/15/2014  Documentation initiated by:  Magdalen Spatz  Subjective/Objective Assessment:     Action/Plan:   Anticipated DC Date:  08/25/2014   Anticipated DC Plan:  [place]         Choice offered to / List presented to:  C-1 Patient   DME arranged  Vassie Moselle      DME agency  [place] arranged  HH-1 RN  [place].   Status of service:  Completed, signed off Medicare Important Message given?  YES (If response is "NO", the following Medicare IM given date fields will be blank) Date Medicare IM given:  08/22/2014 Medicare IM given by:  Magdalen Spatz Date Additional Medicare IM given:  08/25/2014 Additional Medicare IM given by:  Magdalen Spatz  Discharge Disposition:  [place]  Per UR Regulation:  Reviewed for med. necessity/level of care/duration of stay  If discussed at [place] of Stay Meetings, dates discussed:   08/19/2014  08/21/2014    Comments:  08-20-14 Spoke to patient , patient's sister and patient's daughter Lelon Frohlich [phone] ) at bedside.  Confirmed face sheet information.  Patinet lives with her sister , daughter lives close by and wants to learn ostomy / wound care. Patient requesting rolling walker , same ordered . Magdalen Spatz RN BSN [phone]   08-18-14 CCS will change VAC on 08-19-14 and determine if home VAC needed. Magdalen Spatz RN BSN    08-15-14 Spoke to [employer] PA , unsure if [facility] will be needed at home , will know closer to discharge time . VAC application in shadow chart . Need depth of wound measurement , before application can be faxed to [facility] .Marland Kitchen VAC due to be changed tomorrow . Bedside nurse and charge nurse aware , measurements needed . Will continue to follow . Magdalen Spatz RN BSN

## 2014-08-15 NOTE — Progress Notes (Signed)
Patient ID: Brianna Duncan, female   DOB: 09-19-1946, 68 y.o.   MRN: [id number] 1 Day Post-Op  Subjective: Pt feeling pretty well today.  Sore as expected.  Saw dead people over night on morphine.  Changed to dilaudid and doing much better  Objective: Vital signs in last 24 hours: Temp:  [97.9 F (36.6 C)-99.1 F (37.3 C)] 97.9 F (36.6 C) (09/18 0529) Pulse Rate:  [96-120] 104 (09/18 0529) Resp:  [12-27] 27 (09/18 0810) BP: (82-186)/(46-94) 101/63 mmHg (09/18 0529) SpO2:  [84 %-100 %] 93 % (09/18 0810) Last BM Date: 08/14/14  Intake/Output from previous day: 09/17 0701 - 09/18 0700 In: 3731.7 [P.O.:540; I.V.:3191.7] Out: 575 [Urine:475; Blood:100] Intake/Output this shift: Total I/O In: -  Out: 240 [Emesis/NG output:240]  PE: Abd: soft, appropriately tender, quiet, VAC in place, NGT with bilious output, ostomy with no output, stoma is pink and viable Heart: regular GU: foley in place  Lab Results:   Recent Labs  08/14/14 0546 08/15/14 0448  WBC 20.0* 17.9*  HGB 11.0* 9.7*  HCT 33.4* 29.7*  PLT 397 357   BMET  Recent Labs  08/14/14 0546 08/15/14 0448  NA 137 140  K 3.9 3.9  CL 101 104  CO2 22 19  GLUCOSE 91 120*  BUN 9 15  CREATININE 0.72 1.04  CALCIUM 7.9* 7.1*   PT/INR  Recent Labs  08/12/14 1525 08/14/14 0546  LABPROT 18.7* 14.9  INR 1.56* 1.17   CMP     Component Value Date/Time   NA 140 08/15/2014 0448   K 3.9 08/15/2014 0448   CL 104 08/15/2014 0448   CO2 19 08/15/2014 0448   GLUCOSE 120* 08/15/2014 0448   BUN 15 08/15/2014 0448   CREATININE 1.04 08/15/2014 0448   CREATININE 0.64 04/02/2014 1720   CALCIUM 7.1* 08/15/2014 0448   PROT 5.6* 08/11/2014 0731   ALBUMIN 2.4* 08/11/2014 0731   AST 24 08/11/2014 0731   ALT 13 08/11/2014 0731   ALKPHOS 119* 08/11/2014 0731   BILITOT 0.5 08/11/2014 0731   GFRNONAA 54* 08/15/2014 0448   GFRAA 63* 08/15/2014 0448   Lipase     Component Value Date/Time   LIPASE 17 08/10/2014 1915        Studies/Results: No results found.  Anti-infectives: Anti-infectives   Start     Dose/Rate Route Frequency Ordered Stop   08/14/14 0800  cefoTEtan (CEFOTAN) 2 g in dextrose 5 % 50 mL IVPB     2 g 100 mL/hr over 30 Minutes Intravenous On call to O.R. 08/14/14 0750 08/14/14 1450   08/12/14 1800  vancomycin (VANCOCIN) 500 mg in sodium chloride 0.9 % 100 mL IVPB  Status:  Discontinued     500 mg 100 mL/hr over 60 Minutes Intravenous Every 12 hours 08/12/14 1649 08/14/14 1900   08/11/14 2200  piperacillin-tazobactam (ZOSYN) IVPB 3.375 g     3.375 g 12.5 mL/hr over 240 Minutes Intravenous 3 times per day 08/11/14 1610     08/11/14 1700  piperacillin-tazobactam (ZOSYN) IVPB 3.375 g     3.375 g 100 mL/hr over 30 Minutes Intravenous  Once 08/11/14 1610 08/11/14 1853   08/11/14 0400  ciprofloxacin (CIPRO) IVPB 400 mg  Status:  Discontinued     400 mg 200 mL/hr over 60 Minutes Intravenous Every 12 hours 08/11/14 0206 08/11/14 1606   08/11/14 0400  metroNIDAZOLE (FLAGYL) IVPB 500 mg  Status:  Discontinued     500 mg 100 mL/hr over 60 Minutes Intravenous Every 8 hours  08/11/14 0206 08/11/14 1606       Assessment/Plan  1. POD 1, S/p laparotomy with Hartman's procedure for perforated sigmoid colon   2. Liver mass 3. HTN  Plan: 1. Patient doing well.  Will likely have a post op ileus for several days 2. Leave foley in today 3. Start VAC changes tomorrow 4. Cont 7 days of abx therapy, Zosyn D2/7 5. PT eval and treat for mobilization 6. Cont dilaudid PCA   LOS: 5 days    Kaitlyn Skowron E 08/15/2014, 9:30 AM Pager: [phone]

## 2014-08-16 LAB — CBC
HCT: 29.4 % — ABNORMAL LOW (ref 36.0–46.0)
Hemoglobin: 9.5 g/dL — ABNORMAL LOW (ref 12.0–15.0)
MCH: 30.6 pg (ref 26.0–34.0)
MCHC: 32.3 g/dL (ref 30.0–36.0)
MCV: 94.8 fL (ref 78.0–100.0)
Platelets: 365 10*3/uL (ref 150–400)
RBC: 3.1 MIL/uL — ABNORMAL LOW (ref 3.87–5.11)
RDW: 15.5 % (ref 11.5–15.5)
WBC: 16.7 10*3/uL — ABNORMAL HIGH (ref 4.0–10.5)

## 2014-08-16 LAB — BASIC METABOLIC PANEL
ANION GAP: 15 (ref 5–15)
BUN: 15 mg/dL (ref 6–23)
CO2: 18 mEq/L — ABNORMAL LOW (ref 19–32)
CREATININE: 0.63 mg/dL (ref 0.50–1.10)
Calcium: 7.2 mg/dL — ABNORMAL LOW (ref 8.4–10.5)
Chloride: 111 mEq/L (ref 96–112)
GFR calc Af Amer: >90 mL/min (ref >90)
GFR calc non Af Amer: >90 mL/min (ref >90)
Glucose, Bld: 111 mg/dL — ABNORMAL HIGH (ref 70–99)
POTASSIUM: 3.9 meq/L (ref 3.7–5.3)
Sodium: 144 mEq/L (ref 137–147)

## 2014-08-16 MED ORDER — SODIUM CHLORIDE 0.9 % IV SOLN
INTRAVENOUS | Status: DC
  Administered 2014-08-16 – 2014-08-23 (×16): via INTRAVENOUS
  Filled 2014-08-16 (×21): qty 1000

## 2014-08-16 MED ORDER — HYDROMORPHONE HCL 1 MG/ML IJ SOLN
0.5000 mg | INTRAMUSCULAR | Status: DC | PRN
  Administered 2014-08-16: 0.5 mg via INTRAVENOUS
  Administered 2014-08-17 (×5): 1 mg via INTRAVENOUS
  Administered 2014-08-17: 0.5 mg via INTRAVENOUS
  Administered 2014-08-17 – 2014-08-21 (×24): 1 mg via INTRAVENOUS
  Filled 2014-08-16 (×31): qty 1

## 2014-08-16 MED ORDER — ENOXAPARIN SODIUM 40 MG/0.4ML ~~LOC~~ SOLN
40.0000 mg | Freq: Every day | SUBCUTANEOUS | Status: DC
  Administered 2014-08-16 – 2014-08-25 (×10): 40 mg via SUBCUTANEOUS
  Filled 2014-08-16 (×11): qty 0.4

## 2014-08-16 NOTE — Progress Notes (Addendum)
2 Days Post-Op  Subjective: Stable and alert. Daughter in room. Picks at NG tube some period has to be reminded not to get out of bed without assistance. Afebrile. Borderline tachycardia. Reasonable urine output.SpO2 91-96% on nasal O2. Hemoglobin 9.5. WBC 16,700. Potassium 3.9. Creatinine 0.63. Glucose 111  Objective: Vital signs in last 24 hours: Temp:  [98.1 F (36.7 C)-99.3 F (37.4 C)] 98.5 F (36.9 C) (09/19 0201) Pulse Rate:  [106-119] 119 (09/19 0201) Resp:  [15-29] 18 (09/19 0523) BP: (116-130)/(66-72) 130/72 mmHg (09/19 0201) SpO2:  [90 %-96 %] 91 % (09/19 0523) Last BM Date: 08/15/14  Intake/Output from previous day: 09/18 0701 - 09/19 0700 In: 380 [P.O.:380] Out: 1565 [Urine:925; Emesis/NG output:640] Intake/Output this shift: Total I/O In: -  Out: 575 [Urine:575]  General appearance: alert. Denies shortness of breath. Oxygen mask in place. No tachypnea. Looks a little older and a little more feeble than listed age. Does not appear to be any distress. Resp: lungs are actually clear anteriorly bilaterally. Some decreased breath sounds at bases. No rhonchi GI: soft. Appropriately tender. Silent. . Midline wound with negative pressure dressing in place. Colostomy pink. No stool or flatus in bag  Lab Results:  Results for orders placed during the hospital encounter of 08/10/14 (from the past 24 hour(s))  CBC     Status: Abnormal   Collection Time    08/16/14 12:42 AM      Result Value Ref Range   WBC 16.7 (*) 4.0 - 10.5 K/uL   RBC 3.10 (*) 3.87 - 5.11 MIL/uL   Hemoglobin 9.5 (*) 12.0 - 15.0 g/dL   HCT 29.4 (*) 36.0 - 46.0 %   MCV 94.8  78.0 - 100.0 fL   MCH 30.6  26.0 - 34.0 pg   MCHC 32.3  30.0 - 36.0 g/dL   RDW 15.5  11.5 - 15.5 %   Platelets 365  150 - 400 K/uL  BASIC METABOLIC PANEL     Status: Abnormal   Collection Time    08/16/14 12:42 AM      Result Value Ref Range   Sodium 144  137 - 147 mEq/L   Potassium 3.9  3.7 - 5.3 mEq/L   Chloride 111  96 -  112 mEq/L   CO2 18 (*) 19 - 32 mEq/L   Glucose, Bld 111 (*) 70 - 99 mg/dL   BUN 15  6 - 23 mg/dL   Creatinine, Ser 0.63  0.50 - 1.10 mg/dL   Calcium 7.2 (*) 8.4 - 10.5 mg/dL   GFR calc non Af Amer >90  >90 mL/min   GFR calc Af Amer >90  >90 mL/min   Anion gap 15  5 - 15     Studies/Results: No results found.  Marland Kitchen HYDROmorphone PCA 0.3 mg/mL   Intravenous 6 times per day  . piperacillin-tazobactam (ZOSYN)  IV  3.375 g Intravenous 3 times per day  . sodium chloride  500 mL Intravenous Once     Assessment/Plan: s/p Procedure(s): EXPLORATORY LAPAROTOMY TOTAL COLECTOMY/COLOSTOMY  POD #1.  laparotomy with Hartman's procedure for perforated sigmoid colon. Continue NG for expected ileus Discontinue Foley Mobilize and ambulate Laban Emperor, M.D. 3/7. Start VAC changes today Appreciate consultation by wound and ostomy nursing staff and by physical therapy.  Left lobe liver mass.Needs further evaluation at some point once recovered from surgery  Still requiring O2. Suspect atelectasis. Emphasize incentive spirometry and increased ambulation  Repeat lab work tomorrow.  @PROBHOSP @  LOS: 6 days  Demarie Hyneman M 08/16/2014  . .prob

## 2014-08-17 LAB — CULTURE, BLOOD (ROUTINE X 2)
CULTURE: NO GROWTH
Culture: NO GROWTH

## 2014-08-17 LAB — CBC
HCT: 30.6 % — ABNORMAL LOW (ref 36.0–46.0)
Hemoglobin: 9.9 g/dL — ABNORMAL LOW (ref 12.0–15.0)
MCH: 30.7 pg (ref 26.0–34.0)
MCHC: 32.4 g/dL (ref 30.0–36.0)
MCV: 94.7 fL (ref 78.0–100.0)
PLATELETS: 459 10*3/uL — AB (ref 150–400)
RBC: 3.23 MIL/uL — AB (ref 3.87–5.11)
RDW: 15.6 % — ABNORMAL HIGH (ref 11.5–15.5)
WBC: 15.1 10*3/uL — ABNORMAL HIGH (ref 4.0–10.5)

## 2014-08-17 LAB — BASIC METABOLIC PANEL
ANION GAP: 15 (ref 5–15)
BUN: 7 mg/dL (ref 6–23)
CALCIUM: 8 mg/dL — AB (ref 8.4–10.5)
CO2: 22 mEq/L (ref 19–32)
Chloride: 110 mEq/L (ref 96–112)
Creatinine, Ser: 0.47 mg/dL — ABNORMAL LOW (ref 0.50–1.10)
GFR calc Af Amer: >90 mL/min (ref >90)
Glucose, Bld: 89 mg/dL (ref 70–99)
POTASSIUM: 3.5 meq/L — AB (ref 3.7–5.3)
Sodium: 147 mEq/L (ref 137–147)

## 2014-08-17 LAB — STOOL CULTURE

## 2014-08-17 MED ORDER — ALPRAZOLAM 0.5 MG PO TABS: 0.5000 mg | ORAL_TABLET | Freq: Two times a day (BID) | ORAL | Status: DC | PRN

## 2014-08-17 NOTE — Progress Notes (Signed)
3 Days Post-Op  Subjective: Stable. More alert. Less confused. Complains of dry mouth. No  significant stool or flatus from ostomy yet.  Foley out. Voiding uneventfully.Excellent urine output. NG output 500 cc per 24 hours. Bilious. Getting up to the bedside commode Hemoglobin 9.9. WBC 15,100. Creatinine 0.47. Potassium 3.5.  Objective: Vital signs in last 24 hours: Temp:  [98.3 F (36.8 C)-99.8 F (37.7 C)] 99.8 F (37.7 C) (09/20 0552) Pulse Rate:  [106-113] 106 (09/20 0552) Resp:  [16-22] 20 (09/20 0552) BP: (122-129)/(60-78) 129/69 mmHg (09/20 0552) SpO2:  [91 %-94 %] 92 % (09/20 0552) Last BM Date: 08/15/14  Intake/Output from previous day: 09/19 0701 - 09/20 0700 In: 1101.7 [I.V.:701.7; IV Piggyback:400] Out: 2205 [Urine:1650; Emesis/NG output:500; Drains:5; Stool:50] Intake/Output this shift:    General appearance: alert. More oriented. In no distress. Daughter in room. Resp: clear to auscultation bilaterally GI: abdomen soft. Bowel sounds are present.  Borderline distended. Colostomy pink.. Bag empty. Negative pressure dressing in place with clear serosanguineous drainage.  Lab Results:  Results for orders placed during the hospital encounter of 08/10/14 (from the past 24 hour(s))  CBC     Status: Abnormal   Collection Time    08/17/14  5:45 AM      Result Value Ref Range   WBC 15.1 (*) 4.0 - 10.5 K/uL   RBC 3.23 (*) 3.87 - 5.11 MIL/uL   Hemoglobin 9.9 (*) 12.0 - 15.0 g/dL   HCT 30.6 (*) 36.0 - 46.0 %   MCV 94.7  78.0 - 100.0 fL   MCH 30.7  26.0 - 34.0 pg   MCHC 32.4  30.0 - 36.0 g/dL   RDW 15.6 (*) 11.5 - 15.5 %   Platelets 459 (*) 150 - 400 K/uL  BASIC METABOLIC PANEL     Status: Abnormal   Collection Time    08/17/14  5:45 AM      Result Value Ref Range   Sodium 147  137 - 147 mEq/L   Potassium 3.5 (*) 3.7 - 5.3 mEq/L   Chloride 110  96 - 112 mEq/L   CO2 22  19 - 32 mEq/L   Glucose, Bld 89  70 - 99 mg/dL   BUN 7  6 - 23 mg/dL   Creatinine, Ser 0.47  (*) 0.50 - 1.10 mg/dL   Calcium 8.0 (*) 8.4 - 10.5 mg/dL   GFR calc non Af Amer >90  >90 mL/min   GFR calc Af Amer >90  >90 mL/min   Anion gap 15  5 - 15     Studies/Results: No results found.  . enoxaparin (LOVENOX) injection  40 mg Subcutaneous Daily  . piperacillin-tazobactam (ZOSYN)  IV  3.375 g Intravenous 3 times per day  . sodium chloride  500 mL Intravenous Once     Assessment/Plan: s/p Procedure(s): EXPLORATORY LAPAROTOMY TOTAL COLECTOMY/COLOSTOMY  POD #2. laparotomy with Hartman's procedure for perforated sigmoid colon.  Continue NG for expected ileus..?dc tomorrow Mobilize and ambulate Continue Zosyn, . 4/7.  Appreciate consultation by wound and ostomy nursing staff and by physical therapy.  Check pathology Left lobe liver mass.Needs further evaluation at some point once recovered from surgery   Still requiring O2. Suspect atelectasis. better  Emphasize incentive spirometry and increased ambulation     @PROBHOSP @  LOS: 7 days    Isreal Moline M 08/17/2014  . .prob

## 2014-08-17 NOTE — Telephone Encounter (Signed)
Refill signed, but noted patient in hospital after abdominal surgery. Can call in if needed so available at hospital discharge, but only to use if not on other sedating medications once she is home.  If so, will need to discuss use and risks with this medicine.

## 2014-08-18 ENCOUNTER — Encounter (HOSPITAL_COMMUNITY): Payer: Self-pay | Admitting: General Surgery

## 2014-08-18 LAB — COMPREHENSIVE METABOLIC PANEL
ALT: 13 U/L (ref 0–35)
AST: 25 U/L (ref 0–37)
Albumin: 2.1 g/dL — ABNORMAL LOW (ref 3.5–5.2)
Alkaline Phosphatase: 99 U/L (ref 39–117)
Anion gap: 18 — ABNORMAL HIGH (ref 5–15)
BUN: 5 mg/dL — ABNORMAL LOW (ref 6–23)
CO2: 23 mEq/L (ref 19–32)
Calcium: 8.1 mg/dL — ABNORMAL LOW (ref 8.4–10.5)
Chloride: 102 mEq/L (ref 96–112)
Creatinine, Ser: 0.44 mg/dL — ABNORMAL LOW (ref 0.50–1.10)
GFR calc non Af Amer: >90 mL/min (ref >90)
Glucose, Bld: 78 mg/dL (ref 70–99)
Potassium: 3.1 mEq/L — ABNORMAL LOW (ref 3.7–5.3)
SODIUM: 143 meq/L (ref 137–147)
TOTAL PROTEIN: 6.1 g/dL (ref 6.0–8.3)
Total Bilirubin: 0.5 mg/dL (ref 0.3–1.2)

## 2014-08-18 MED ORDER — MENTHOL 3 MG MT LOZG: 1.0000 | LOZENGE | OROMUCOSAL | Status: DC | PRN

## 2014-08-18 NOTE — Progress Notes (Signed)
Physical Therapy Treatment Patient Details Name: Brianna Duncan MRN: [id number] DOB: 1946-08-20 Today's Date: 08/18/2014    History of Present Illness 68 y.o. pt admitted on 08/10/14 with abdominal pain, diarrhea, vomiting, and weakness.  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  Pt now has L upper quadrant colostomy, wound vac and NGT.  Pt with significant PMHx of HTN, anxiety, osteoporosis, and multiple left and one right foot surgery.      PT Comments    Pt is progressing well with her mobility. She was able to walk almost the whole unit today with min guard assist and the RW.  I continue to endorse HHPT at discharge and 24/7 assist for safety at home if family can provide.  PT will continue to follow acutely.    Follow Up Recommendations  Home health PT;Supervision/Assistance - 24 hour     Equipment Recommendations  None recommended by PT    Recommendations for Other Services   NA     Precautions / Restrictions Precautions Precautions: Fall;Other (comment) Precaution Comments: colostomy, wound vac, NGT, O2, and daughter reports she was unsteady on her feet PTA    Mobility  Bed Mobility Overal bed mobility: Modified Independent             General bed mobility comments: Pt using bed rail for leverage, but able to get EOB without help from the PT.   Transfers Overall transfer level: Needs assistance Equipment used: Rolling walker (2 wheeled) Transfers: Sit to/from Stand Sit to Stand: Min guard         General transfer comment: Min guard assist for safety  Ambulation/Gait Ambulation/Gait assistance: Min guard Ambulation Distance (Feet): 350 Feet Assistive device: Rolling walker (2 wheeled) Gait Pattern/deviations: Step-through pattern;Shuffle;Trunk flexed Gait velocity: decreased Gait velocity interpretation: Below normal speed for age/gender General Gait Details: Pt with slow, shuffling gait speed.  Verbal cues for  upright posture and min guard assist for safety during gait. Pt needed some assist to steer RW around tight obstacles in her room.           Balance Overall balance assessment: Needs assistance Sitting-balance support: Feet supported;No upper extremity supported Sitting balance-Leahy Scale: Good     Standing balance support: Bilateral upper extremity supported Standing balance-Leahy Scale: Poor Standing balance comment: needs external assist for standing and gait.                     Cognition Arousal/Alertness: Awake/alert Behavior During Therapy: WFL for tasks assessed/performed Overall Cognitive Status: Impaired/Different from baseline Area of Impairment: Attention   Current Attention Level: Sustained           General Comments: Pt had difficulty staying on task in quiet room while preforming leg exercises.     Exercises General Exercises - Upper Extremity Shoulder Flexion: AROM;Both;10 reps;Seated Elbow Flexion: AROM;Both;10 reps;Seated General Exercises - Lower Extremity Ankle Circles/Pumps: AROM;Both;20 reps;Seated Long Arc Quad: AROM;Both;10 reps;Seated Hip ABduction/ADduction: AROM;Both;10 reps;Seated Hip Flexion/Marching: AROM;Both;10 reps;Seated        Pertinent Vitals/Pain Pain Assessment: No/denies pain           PT Goals (current goals can now be found in the care plan section) Acute Rehab PT Goals Patient Stated Goal: to get better and go home Progress towards PT goals: Progressing toward goals    Frequency  Min 3X/week    PT Plan Current plan remains appropriate       End of Session Equipment  Utilized During Treatment: Oxygen Activity Tolerance: Patient tolerated treatment well Patient left: in chair;with call bell/phone within reach;with family/visitor present     Time: 4696-2952 PT Time Calculation (min): 45 min  Charges:  $Gait Training: 23-37 mins $Therapeutic Exercise: 8-22 mins            Shiah Berhow B. [place],  [place], DPT [phone]   08/18/2014, 4:52 PM

## 2014-08-18 NOTE — Telephone Encounter (Signed)
Called in script to pharmacy. LM on machine for pt to call to schedule an appt for a hospital follow up.

## 2014-08-18 NOTE — Consult Note (Signed)
WOC follow-up: Abd Vac dressing was changed by bedside nurse on Sat, and ostomy pouch was changed at the same time R/T the close proximity to the wound.  Current pouch intact with good seal, no stool or flatus in the pouch at this time.  Pt has NG and is feeling poorly.  Will perform Vac dressing change and pouch change demonstration tomorrow. Julien Girt MSN, RN, [place], [place], [place]

## 2014-08-18 NOTE — Progress Notes (Signed)
Patient ID: Brianna Duncan, female   DOB: November 24, 1946, 68 y.o.   MRN: [id number] 4 Days Post-Op  Subjective: Pt feels ok this morning.  Still no flatus in her bag.  Ambulated twice yesterday in the halls.  Voiding well.  Pain controlled  Objective: Vital signs in last 24 hours: Temp:  [98.2 F (36.8 C)-99.1 F (37.3 C)] 99.1 F (37.3 C) (09/21 0535) Pulse Rate:  [95-108] 104 (09/21 0535) Resp:  [16-24] 24 (09/21 0535) BP: (124-136)/(69-88) 134/75 mmHg (09/21 0535) SpO2:  [91 %-95 %] 92 % (09/21 0535) Last BM Date: 08/11/14  Intake/Output from previous day: 09/20 0701 - 09/21 0700 In: 2782 [I.V.:2704; IV Piggyback:78] Out: 2355 [Urine:1825; Emesis/NG output:500; Drains:30] Intake/Output this shift: Total I/O In: -  Out: 350 [Urine:350]  PE: Abd: soft, seems a bit bloated, but has some BS, NGT suction was on 1104mmHG with some bilious output.  This was turned down to 3mmHG.  VAC in place.  Ostomy in place with no output.  Stoma is pink and healthy  Lab Results:   Recent Labs  08/16/14 0042 08/17/14 0545  WBC 16.7* 15.1*  HGB 9.5* 9.9*  HCT 29.4* 30.6*  PLT 365 459*   BMET  Recent Labs  08/16/14 0042 08/17/14 0545  NA 144 147  K 3.9 3.5*  CL 111 110  CO2 18* 22  GLUCOSE 111* 89  BUN 15 7  CREATININE 0.63 0.47*  CALCIUM 7.2* 8.0*   PT/INR No results found for this basename: LABPROT, INR,  in the last 72 hours CMP     Component Value Date/Time   NA 147 08/17/2014 0545   K 3.5* 08/17/2014 0545   CL 110 08/17/2014 0545   CO2 22 08/17/2014 0545   GLUCOSE 89 08/17/2014 0545   BUN 7 08/17/2014 0545   CREATININE 0.47* 08/17/2014 0545   CREATININE 0.64 04/02/2014 1720   CALCIUM 8.0* 08/17/2014 0545   PROT 5.6* 08/11/2014 0731   ALBUMIN 2.4* 08/11/2014 0731   AST 24 08/11/2014 0731   ALT 13 08/11/2014 0731   ALKPHOS 119* 08/11/2014 0731   BILITOT 0.5 08/11/2014 0731   GFRNONAA >90 08/17/2014 0545   GFRAA >90 08/17/2014 0545   Lipase     Component Value Date/Time   LIPASE 17 08/10/2014 1915       Studies/Results: No results found.  Anti-infectives: Anti-infectives   Start     Dose/Rate Route Frequency Ordered Stop   08/14/14 0800  cefoTEtan (CEFOTAN) 2 g in dextrose 5 % 50 mL IVPB     2 g 100 mL/hr over 30 Minutes Intravenous On call to O.R. 08/14/14 0750 08/14/14 1450   08/12/14 1800  vancomycin (VANCOCIN) 500 mg in sodium chloride 0.9 % 100 mL IVPB  Status:  Discontinued     500 mg 100 mL/hr over 60 Minutes Intravenous Every 12 hours 08/12/14 1649 08/14/14 1900   08/11/14 2200  piperacillin-tazobactam (ZOSYN) IVPB 3.375 g     3.375 g 12.5 mL/hr over 240 Minutes Intravenous 3 times per day 08/11/14 1610     08/11/14 1700  piperacillin-tazobactam (ZOSYN) IVPB 3.375 g     3.375 g 100 mL/hr over 30 Minutes Intravenous  Once 08/11/14 1610 08/11/14 1853   08/11/14 0400  ciprofloxacin (CIPRO) IVPB 400 mg  Status:  Discontinued     400 mg 200 mL/hr over 60 Minutes Intravenous Every 12 hours 08/11/14 0206 08/11/14 1606   08/11/14 0400  metroNIDAZOLE (FLAGYL) IVPB 500 mg  Status:  Discontinued  500 mg 100 mL/hr over 60 Minutes Intravenous Every 8 hours 08/11/14 0206 08/11/14 1606       Assessment/Plan  1. POD 4, S/p laparotomy with Hartman's procedure for perforated sigmoid colon  2. Liver mass, prior biopsy with cirrhosis  3. HTN 4. Post op ileus   Plan: 1. Zosyn D5/7 2. Check labs in AM.  WBC was 15K yesterday 3. Cont NGT to LIWS as patient still with no output from ostomy despite some bowel sounds.  She is also still bloated today 4. Cont to mobilize and pulm toilet 5. Await pathology, still pending 6. VAC changes T,TH,S  LOS: 8 days    Brianna Duncan 08/18/2014, 8:49 AM Pager: [phone]

## 2014-08-18 NOTE — Progress Notes (Signed)
Has some bowel sounds, but no flatus or BM yet. VAC changed Saturday Recheck LFT's   Path pending  Imogene Burn. [name], MD, [facility]  General/ Trauma Surgery  08/18/2014 10:18 AM

## 2014-08-19 LAB — BASIC METABOLIC PANEL
Anion gap: 17 — ABNORMAL HIGH (ref 5–15)
BUN: 5 mg/dL — ABNORMAL LOW (ref 6–23)
CALCIUM: 7.6 mg/dL — AB (ref 8.4–10.5)
CO2: 22 meq/L (ref 19–32)
Chloride: 105 mEq/L (ref 96–112)
Creatinine, Ser: 0.45 mg/dL — ABNORMAL LOW (ref 0.50–1.10)
GFR calc Af Amer: >90 mL/min (ref >90)
GFR calc non Af Amer: >90 mL/min (ref >90)
GLUCOSE: 72 mg/dL (ref 70–99)
Potassium: 3.1 mEq/L — ABNORMAL LOW (ref 3.7–5.3)
SODIUM: 144 meq/L (ref 137–147)

## 2014-08-19 LAB — CBC
HEMATOCRIT: 26.7 % — AB (ref 36.0–46.0)
HEMOGLOBIN: 8.7 g/dL — AB (ref 12.0–15.0)
MCH: 30.4 pg (ref 26.0–34.0)
MCHC: 32.6 g/dL (ref 30.0–36.0)
MCV: 93.4 fL (ref 78.0–100.0)
Platelets: 493 10*3/uL — ABNORMAL HIGH (ref 150–400)
RBC: 2.86 MIL/uL — ABNORMAL LOW (ref 3.87–5.11)
RDW: 15.4 % (ref 11.5–15.5)
WBC: 10.1 10*3/uL (ref 4.0–10.5)

## 2014-08-19 MED ORDER — CHLORHEXIDINE GLUCONATE 0.12 % MT SOLN
15.0000 mL | Freq: Two times a day (BID) | OROMUCOSAL | Status: DC
  Administered 2014-08-19 – 2014-08-25 (×10): 15 mL via OROMUCOSAL
  Filled 2014-08-19 (×8): qty 15

## 2014-08-19 MED ORDER — POTASSIUM CHLORIDE 10 MEQ/100ML IV SOLN
10.0000 meq | INTRAVENOUS | Status: AC
  Administered 2014-08-19 (×4): 10 meq via INTRAVENOUS
  Filled 2014-08-19 (×4): qty 100

## 2014-08-19 NOTE — Progress Notes (Signed)
[facility] Progress Note  5 Days Post-Op  Subjective: Pt denies N/V.  No flatus or BM yet in bag.  Good BS.  Ambulating some.  Crying this am due to being overwhelmed and anxious.  [place] nurse at bedside changing vac and dressing.  Objective: Vital signs in last 24 hours: Temp:  [98.2 F (36.8 C)-98.3 F (36.8 C)] 98.2 F (36.8 C) (09/21 2224) Pulse Rate:  [86-102] 102 (09/21 2224) Resp:  [18-21] 18 (09/21 2224) BP: (129-135)/(72-75) 135/72 mmHg (09/21 2224) SpO2:  [94 %-95 %] 94 % (09/21 2224) Last BM Date: 08/11/14  Intake/Output from previous day: 09/21 0701 - 09/22 0700 In: 3098 [P.O.:600; I.V.:2348; IV Piggyback:150] Out: 2490 [Urine:2200; Emesis/NG output:275; Stool:15] Intake/Output this shift:    PE: Gen:  Alert, NAD, pleasant Abd: Soft, mild tenderness, ND, +BS, no HSM, midline wound healing well, pretty superficial, C/D/I   08/19/14   Lab Results:   Recent Labs  08/17/14 0545 08/19/14 0411  WBC 15.1* 10.1  HGB 9.9* 8.7*  HCT 30.6* 26.7*  PLT 459* 493*   BMET  Recent Labs  08/18/14 1643 08/19/14 0411  NA 143 144  K 3.1* 3.1*  CL 102 105  CO2 23 22  GLUCOSE 78 72  BUN 5* 5*  CREATININE 0.44* 0.45*  CALCIUM 8.1* 7.6*   PT/INR No results found for this basename: LABPROT, INR,  in the last 72 hours CMP     Component Value Date/Time   NA 144 08/19/2014 0411   K 3.1* 08/19/2014 0411   CL 105 08/19/2014 0411   CO2 22 08/19/2014 0411   GLUCOSE 72 08/19/2014 0411   BUN 5* 08/19/2014 0411   CREATININE 0.45* 08/19/2014 0411   CREATININE 0.64 04/02/2014 1720   CALCIUM 7.6* 08/19/2014 0411   PROT 6.1 08/18/2014 1643   ALBUMIN 2.1* 08/18/2014 1643   AST 25 08/18/2014 1643   ALT 13 08/18/2014 1643   ALKPHOS 99 08/18/2014 1643   BILITOT 0.5 08/18/2014 1643   GFRNONAA >90 08/19/2014 0411   GFRAA >90 08/19/2014 0411   Lipase     Component Value Date/Time   LIPASE 17 08/10/2014 1915       Studies/Results: No results  found.  Anti-infectives: Anti-infectives   Start     Dose/Rate Route Frequency Ordered Stop   08/14/14 0800  cefoTEtan (CEFOTAN) 2 g in dextrose 5 % 50 mL IVPB     2 g 100 mL/hr over 30 Minutes Intravenous On call to O.R. 08/14/14 0750 08/14/14 1450   08/12/14 1800  vancomycin (VANCOCIN) 500 mg in sodium chloride 0.9 % 100 mL IVPB  Status:  Discontinued     500 mg 100 mL/hr over 60 Minutes Intravenous Every 12 hours 08/12/14 1649 08/14/14 1900   08/11/14 2200  piperacillin-tazobactam (ZOSYN) IVPB 3.375 g     3.375 g 12.5 mL/hr over 240 Minutes Intravenous 3 times per day 08/11/14 1610     08/11/14 1700  piperacillin-tazobactam (ZOSYN) IVPB 3.375 g     3.375 g 100 mL/hr over 30 Minutes Intravenous  Once 08/11/14 1610 08/11/14 1853   08/11/14 0400  ciprofloxacin (CIPRO) IVPB 400 mg  Status:  Discontinued     400 mg 200 mL/hr over 60 Minutes Intravenous Every 12 hours 08/11/14 0206 08/11/14 1606   08/11/14 0400  metroNIDAZOLE (FLAGYL) IVPB 500 mg  Status:  Discontinued     500 mg 100 mL/hr over 60 Minutes Intravenous Every 8 hours 08/11/14 0206 08/11/14 1606     PATHOLOGY  REPORT: Diagnosis Colon, segmental resection, Sigmoid - ULCERATION WITH SUPPURATIVE INFLAMMATION AND NECROSIS AND ACUTE SEROSITIS CONSISTENT WITH PERFORATION. - NO EVIDENCE OF MALIGNANCY. - SEE MICROSCOPIC DESCRIPTION  Assessment/Plan 1. POD 5, S/p laparotomy with Hartman's procedure for perforated sigmoid colon  2. Liver mass, prior biopsy with cirrhosis  3. HTN  4. Post op ileus  5. Hypokalemia - 3.1 will supplement  Plan:  1. Zosyn D #6/7 - d/c after day 7 2. Check labs in AM. WBC 10.1 today  3. Cont NGT to LIWS as patient still with no output from ostomy despite some bowel sounds, less distended today, maybe d/c ng tomorrow if we see improvement in bowel function 4. Cont to mobilize and pulm toilet  5. Path shows ulceration, suppurative inflammation and necrosis with acute serositis, no evidence of  malignancy 6. D/c vac and start WD BID - this will continue at discharge 7. Reviewed path report with the patient    LOS: 9 days    DORT, Aaralyn Kil 08/19/2014, 8:46 AM Pager: [phone]

## 2014-08-19 NOTE — Consult Note (Addendum)
WOC follow-up: Abd wound followed by [facility] team; they were at bedside when Vac dressing removed and ordered this therapy discontinued.  Abd wound beefy red and almost even with skin level.  Moist gauze dressing applied.  Demonstrated pouch change; pt assisted and asked questions.  She is able to open and close velcro without assistance.  Stoma red and viable, 1 1/2 inches, above skin level.  No stool or flatus in pouch, small amt yellow drainage.  Applied one piece pouch.  Educational materials left at bedside.  Ostomy supplies ordered to room. Recommend follow-up with home health after discharge, pt is very anxious regarding ostomy care.  Julien Girt MSN, RN, [place], [place], [place]

## 2014-08-19 NOTE — Progress Notes (Signed)
Agree with above, looks like ileus slowly resolving, path most consistent with likely ischemic disease discussed with patient.

## 2014-08-20 NOTE — Progress Notes (Signed)
Patient ID: Brianna Duncan, female   DOB: [birth year], 68 y.o.   MRN: [id number] 6 Days Post-Op  Subjective: Pt feels well today.  Feels "rumbling" in her belly.  Mobilizing well  Objective: Vital signs in last 24 hours: Temp:  [98.5 F (36.9 C)-99.2 F (37.3 C)] 98.5 F (36.9 C) (09/23 0998) Pulse Rate:  [94-116] 103 (09/23 0613) Resp:  [15-17] 15 (09/23 0613) BP: (131-136)/(76-89) 131/77 mmHg (09/23 0613) SpO2:  [92 %-96 %] 92 % (09/23 0613) Last BM Date: 08/11/14  Intake/Output from previous day: 09/22 0701 - 09/23 0700 In: 1975 [P.O.:240; I.V.:1735] Out: 370 [Emesis/NG output:370] Intake/Output this shift: Total I/O In: 765 [I.V.:765] Out: 500 [Emesis/NG output:500]  PE: Abd: soft, good BS with NGT clamped.  (RN reports a lot of her output is ice and water), ostomy with minimal output, bag now opaque and can't see stoma. Wound is clean and opened up slightly at the top portion. Heart: regular Lungs: CTAB  Lab Results:   Recent Labs  08/19/14 0411  WBC 10.1  HGB 8.7*  HCT 26.7*  PLT 493*   BMET  Recent Labs  08/18/14 1643 08/19/14 0411  NA 143 144  K 3.1* 3.1*  CL 102 105  CO2 23 22  GLUCOSE 78 72  BUN 5* 5*  CREATININE 0.44* 0.45*  CALCIUM 8.1* 7.6*   PT/INR No results found for this basename: LABPROT, INR,  in the last 72 hours CMP     Component Value Date/Time   NA 144 08/19/2014 0411   K 3.1* 08/19/2014 0411   CL 105 08/19/2014 0411   CO2 22 08/19/2014 0411   GLUCOSE 72 08/19/2014 0411   BUN 5* 08/19/2014 0411   CREATININE 0.45* 08/19/2014 0411   CREATININE 0.64 04/02/2014 1720   CALCIUM 7.6* 08/19/2014 0411   PROT 6.1 08/18/2014 1643   ALBUMIN 2.1* 08/18/2014 1643   AST 25 08/18/2014 1643   ALT 13 08/18/2014 1643   ALKPHOS 99 08/18/2014 1643   BILITOT 0.5 08/18/2014 1643   GFRNONAA >90 08/19/2014 0411   GFRAA >90 08/19/2014 0411   Lipase     Component Value Date/Time   LIPASE 17 08/10/2014 1915       Studies/Results: No results  found.  Anti-infectives: Anti-infectives   Start     Dose/Rate Route Frequency Ordered Stop   08/14/14 0800  cefoTEtan (CEFOTAN) 2 g in dextrose 5 % 50 mL IVPB     2 g 100 mL/hr over 30 Minutes Intravenous On call to O.R. 08/14/14 0750 08/14/14 1450   08/12/14 1800  vancomycin (VANCOCIN) 500 mg in sodium chloride 0.9 % 100 mL IVPB  Status:  Discontinued     500 mg 100 mL/hr over 60 Minutes Intravenous Every 12 hours 08/12/14 1649 08/14/14 1900   08/11/14 2200  piperacillin-tazobactam (ZOSYN) IVPB 3.375 g     3.375 g 12.5 mL/hr over 240 Minutes Intravenous 3 times per day 08/11/14 1610     08/11/14 1700  piperacillin-tazobactam (ZOSYN) IVPB 3.375 g     3.375 g 100 mL/hr over 30 Minutes Intravenous  Once 08/11/14 1610 08/11/14 1853   08/11/14 0400  ciprofloxacin (CIPRO) IVPB 400 mg  Status:  Discontinued     400 mg 200 mL/hr over 60 Minutes Intravenous Every 12 hours 08/11/14 0206 08/11/14 1606   08/11/14 0400  metroNIDAZOLE (FLAGYL) IVPB 500 mg  Status:  Discontinued     500 mg 100 mL/hr over 60 Minutes Intravenous Every 8 hours 08/11/14 0206 08/11/14 1606  Assessment/Plan 1. POD 6, S/p laparotomy with Hartman's procedure for perforated sigmoid colon  2. Liver mass, prior biopsy with cirrhosis  3. HTN  4. Post op ileus, resolving  5. Hypokalemia   Plan: 1. Recheck labs in am to follow K 2. Patient seems to be drinking a lot of water and ice.  She has minimal output in her bag, but has great bowel sounds.  Will clamp NGT today for 6 hours then check a residual.  If this is low, then may clamp for remainder of the day or dc NGT altogether. 3. Cont to mobilize and pulm toilet 4. Zosyn D7/7.  Will stop after today 5. Cont NS WD dressing changes to abdominal wound.  Superior portion opened up slightly.     LOS: 10 days    Avalin Briley E 08/20/2014, 8:49 AM Pager: [phone]

## 2014-08-20 NOTE — Progress Notes (Signed)
PT Cancellation Note  Patient Details Name: Brianna Duncan MRN: [id number] DOB: [birth year]   Cancelled Treatment:     PT observed patient up ambulating hallway independently. No further acute PT needs warranted. Will sign off.    Melina Modena [place], [place] 08/20/2014, 2:12 PM

## 2014-08-20 NOTE — Progress Notes (Signed)
NG tube clamped Minimal ostomy output, but active bowel sounds Wound clean - continue wet to dry dressings Finished course of abx.  Imogene Burn. [name], MD, [facility]  General/ Trauma Surgery  08/20/2014 9:52 AM

## 2014-08-21 ENCOUNTER — Other Ambulatory Visit: Payer: Self-pay | Admitting: Internal Medicine

## 2014-08-21 ENCOUNTER — Ambulatory Visit: Payer: Medicare Other | Admitting: Emergency Medicine

## 2014-08-21 LAB — BASIC METABOLIC PANEL
ANION GAP: 16 — AB (ref 5–15)
BUN: 4 mg/dL — ABNORMAL LOW (ref 6–23)
CALCIUM: 7.7 mg/dL — AB (ref 8.4–10.5)
CHLORIDE: 102 meq/L (ref 96–112)
CO2: 21 mEq/L (ref 19–32)
CREATININE: 0.48 mg/dL — AB (ref 0.50–1.10)
GFR calc Af Amer: >90 mL/min (ref >90)
GFR calc non Af Amer: >90 mL/min (ref >90)
Glucose, Bld: 68 mg/dL — ABNORMAL LOW (ref 70–99)
Potassium: 3.7 mEq/L (ref 3.7–5.3)
Sodium: 139 mEq/L (ref 137–147)

## 2014-08-21 MED ORDER — HYDROMORPHONE HCL 1 MG/ML IJ SOLN
0.5000 mg | INTRAMUSCULAR | Status: DC | PRN
  Administered 2014-08-23: 1 mg via INTRAVENOUS
  Filled 2014-08-21 (×2): qty 1

## 2014-08-21 MED ORDER — OXYCODONE-ACETAMINOPHEN 5-325 MG PO TABS
1.0000 | ORAL_TABLET | ORAL | Status: DC | PRN
  Administered 2014-08-21 – 2014-08-24 (×14): 2 via ORAL
  Administered 2014-08-24: 1 via ORAL
  Administered 2014-08-24: 2 via ORAL
  Administered 2014-08-24: 1 via ORAL
  Administered 2014-08-25: 2 via ORAL
  Filled 2014-08-21: qty 2
  Filled 2014-08-21: qty 1
  Filled 2014-08-21 (×3): qty 2
  Filled 2014-08-21: qty 1
  Filled 2014-08-21 (×12): qty 2

## 2014-08-21 NOTE — Progress Notes (Signed)
Pt passed a small amt of formed stool through her ostomy this afternoon. BS active, tolerating clears.

## 2014-08-21 NOTE — Discharge Instructions (Signed)
[facility]      [facility] °[phone] ° °OPEN ABDOMINAL SURGERY: POST OP INSTRUCTIONS ° °Always review your discharge instruction sheet given to you by the facility where your surgery was performed. ° °IF YOU HAVE DISABILITY OR FAMILY LEAVE FORMS, YOU MUST BRING THEM TO THE OFFICE FOR PROCESSING.  PLEASE DO NOT GIVE THEM TO YOUR DOCTOR. ° °1. A prescription for pain medication may be given to you upon discharge.  Take your pain medication as prescribed, if needed.  If narcotic pain medicine is not needed, then you may take acetaminophen (Tylenol) or ibuprofen (Advil) as needed. °2. Take your usually prescribed medications unless otherwise directed. °3. If you need a refill on your pain medication, please contact your pharmacy. They will contact our office to request authorization.  Prescriptions will not be filled after 5pm or on week-ends. °4. You should follow a light diet the first few days after arrival home, such as soup and crackers, pudding, etc.unless your doctor has advised otherwise. A high-fiber, low fat diet can be resumed as tolerated.   Be sure to include lots of fluids daily. Most patients will experience some swelling and bruising on the chest and neck area.  Ice packs will help.  Swelling and bruising can take several days to resolve °5. Most patients will experience some swelling and bruising in the area of the incision. Ice pack will help. Swelling and bruising can take several days to resolve..  °6. It is common to experience some constipation if taking pain medication after surgery.  Increasing fluid intake and taking a stool softener will usually help or prevent this problem from occurring.  A mild laxative (Milk of Magnesia or Miralax) should be taken according to package directions if there are no bowel movements after 48 hours. °7.  You may have steri-strips (small skin tapes) in place directly over the incision.  These strips should be left on the skin for 7-10 days.  If your  surgeon used skin glue on the incision, you may shower in 24 hours.  The glue will flake off over the next 2-3 weeks.  Any sutures or staples will be removed at the office during your follow-up visit. You may find that a light gauze bandage over your incision may keep your staples from being rubbed or pulled. You may shower and replace the bandage daily. °8. ACTIVITIES:  You may resume regular (light) daily activities beginning the next day--such as daily self-care, walking, climbing stairs--gradually increasing activities as tolerated.  You may have sexual intercourse when it is comfortable.  Refrain from any heavy lifting or straining until approved by your doctor. °a. You may drive when you no longer are taking prescription pain medication, you can comfortably wear a seatbelt, and you can safely maneuver your car and apply brakes °b. Return to Work: ___________________________________ °9. You should see your doctor in the office for a follow-up appointment approximately two weeks after your surgery.  Make sure that you call for this appointment within a day or two after you arrive home to insure a convenient appointment time. °OTHER INSTRUCTIONS:  °_____________________________________________________________ °_____________________________________________________________ ° °WHEN TO CALL YOUR DOCTOR: °1. Fever over 101.0 °2. Inability to urinate °3. Nausea and/or vomiting °4. Extreme swelling or bruising °5. Continued bleeding from incision. °6. Increased pain, redness, or drainage from the incision. °7. Difficulty swallowing or breathing °8. Muscle cramping or spasms. °9. Numbness or tingling in hands or feet or around lips. ° °The clinic staff is available to   answer your questions during regular business hours.  Please don’t hesitate to call and ask to speak to one of the nurses if you have concerns. ° °For further questions, please visit [email or web address] ° °Colostomy Home Guide °A colostomy is an  opening for stool to leave your body when a medical condition prevents it from leaving through the usual opening (rectum). During a surgery, a piece of large intestine (colon) is brought through a hole in the abdominal wall. The new opening is called a stoma or ostomy. A bag or pouch fits over the stoma to catch stool and gas. Your stool may be liquid, somewhat pasty, or formed. °CARING FOR YOUR STOMA  °Normally, the stoma looks a lot like the inside of your cheek: pink, red, and moist. At first it may be swollen, but this swelling will decrease within 6 weeks. °Keep the skin around your stoma clean and dry. You can gently wash your stoma and the skin around your stoma in the shower with a clean, soft washcloth. If you develop any skin irritation, your caregiver may give you a stoma powder or ointment to help heal the area. Do not use any products other than those specifically given to you by your caregiver.  °Your stoma should not be uncomfortable. If you notice any stinging or burning, your pouch may be leaking, and the skin around your stoma may be coming into contact with stool. This can cause skin irritation. If you notice stinging, replace your pouch with a new one and discard the old one. °OSTOMY POUCHES  °The pouch that fits over the ostomy can be made up of either 1 or 2 pieces. A one-piece pouch has a skin barrier piece and the pouch itself in one unit. A two-piece pouch has a skin barrier with a separate pouch that snaps on and off of the skin barrier. Either way, you should empty the pouch when it is only  to ½ full. Do not let more stool or gas build up. This could cause the pouch to leak. °Some ostomy bags have a built-in gas release valve. Ostomy deodorizer (5 drops) can be put into the pouch to prevent odor. Some people use ostomy lubricant drops inside the pouch to help the stool slide out of the bag more easily and completely.  °EMPTYING YOUR OSTOMY POUCH  °You may get lessons on how to empty your  pouch from a wound-ostomy nurse before you leave the hospital. Here are the basic steps: °· Wash your hands with soap and water. °· Sit far back on the toilet. °· Put several pieces of toilet paper into the toilet water. This will prevent splashing as you empty the stool into the toilet bowl. °· Unclip or unvelcro the tail end of the pouch. °· Unroll the tail and empty stool into the toilet. °· Clean the tail with toilet paper. °· Reroll the tail, and clip or velcro it closed. °· Wash your hands again. °CHANGING YOUR OSTOMY POUCH  °Change your ostomy pouch about every 3 to 4 days for the first 6 weeks, then every 5 to7 days. Always change the bag sooner if there is any leakage or you begin to notice any discomfort or irritation of the skin around the stoma. When possible, plan to change your ostomy pouch before eating or drinking as this will lessen the chance of stool coming out during the pouch change. A wound-ostomy nurse may teach you how to change your pouch before you leave the hospital. Here are   the basic steps:  Hoyle Barr out your supplies.  Wash your hands with soap and water.  Carefully remove the old pouch.  Wash the stoma and allow it to dry. Men may be advised to shave any hair around the stoma very carefully. This will make the adhesive stick better.  Use the stoma measuring guide that comes with your pouch set to decide what size hole you will need to cut in the skin barrier piece. Choose the smallest possible size that will hold the stoma but will not touch it.  Use the guide to trace the circle on the back of the skin barrier piece. Cut out the hole.  Hold the skin barrier piece over the stoma to make sure the hole is the correct size.  Remove the adhesive paper backing from the skin barrier piece.  Squeeze stoma paste around the opening of the skin barrier piece.  Clean and dry the skin around the stoma again.  Carefully fit the skin barrier piece over your stoma.  If you are  using a two-piece pouch, snap the pouch onto the skin barrier piece.  Close the tail of the pouch.  Put your hand over the top of the skin barrier piece to help warm it for about 5 minutes, so that it conforms to your body better.  Wash your hands again. DIET TIPS   Continue to follow your usual diet.  Drink about eight 8 oz glasses of water each day.  You can prevent gas by eating slowly and chewing your food thoroughly.  If you feel concerned that you have too much gas, you can cut back on gas-producing foods, such as:  Spicy foods.  Onions and garlic.  Cruciferous vegetables (cabbage, broccoli, cauliflower, Brussels sprouts).  Beans and legumes.  Some cheeses.  Eggs.  Fish.  Bubbly (carbonated) drinks.  Chewing gum. GENERAL TIPS   You can shower with or without the bag in place.  Always keep the bag on if you are bathing or swimming.  If your bag gets wet, you can dry it with a blow-dryer set to cool.  Avoid wearing tight clothing directly over your stoma so that it does not become irritated or bleed. Tight clothing can also prevent stool from draining into the pouch.  It is helpful to always have an extra skin barrier and pouch with you when traveling. Do not leave them anywhere too warm, as parts of them can melt.  Do not let your seat belt rest on your stoma. Try to keep the seat belt either above or below your stoma, or use a tiny pillow to cushion it.  You can still participate in sports, but you should avoid activities in which there is a risk of getting hit in the abdomen.  You can still have sex. It is a good idea to empty your pouch prior to sex. Some people and their partners feel very comfortable seeing the pouch during sex. Others choose to wear lingerie or a T-shirt that covers the device. SEEK IMMEDIATE MEDICAL CARE IF:  You notice a change in the size or color of the stoma, especially if it becomes very red, purple, black, or pale white.  You  have bloody stools or bleeding from the stoma.  You have abdominal pain, nausea, vomiting, or bloating.  There is anything unusual protruding from the stoma.  You have irritation or red skin around the stoma.  No stool is passing from the stoma.  You have diarrhea (requiring more frequent  than normal pouch emptying). °Document Released: 11/17/2003 Document Revised: 02/06/2012 Document Reviewed: 04/13/2011 °ExitCare® Patient Information ©2015 [employer]. This information is not intended to replace advice given to you by your health care provider. Make sure you discuss any questions you have with your health care provider. ° °Dressing Change °A dressing is a material placed over wounds. It keeps the wound clean, dry, and protected from further injury. This provides an environment that favors wound healing.  °BEFORE YOU BEGIN °· Get your supplies together. Things you may need include: °¨ Saline solution. °¨ Flexible gauze dressing. °¨ Medicated cream. °¨ Tape. °¨ Gloves. °¨ Abdominal dressing pads. °¨ Gauze squares. °¨ Plastic bags. °· Take pain medicine 30 minutes before the dressing change if you need it. °· Take a shower before you do the first dressing change of the day. Use plastic wrap or a plastic bag to prevent the dressing from getting wet. °REMOVING YOUR OLD DRESSING  °· Wash your hands with soap and water. Dry your hands with a clean towel. °· Put on your gloves. °· Remove any tape. °· Carefully remove the old dressing. If the dressing sticks, you may dampen it with warm water to loosen it, or follow your caregiver's specific directions. °· Remove any gauze or packing tape that is in your wound. °· Take off your gloves. °· Put the gloves, tape, gauze, or any packing tape into a plastic bag. °CHANGING YOUR DRESSING °· Open the supplies. °· Take the cap off the saline solution. °· Open the gauze package so that the gauze remains on the inside of the package. °· Put on your gloves. °· Clean your  wound as told by your caregiver. °· If you have been told to keep your wound dry, follow those instructions. °· Your caregiver may tell you to do one or more of the following: °¨ Pick up the gauze. Pour the saline solution over the gauze. Squeeze out the extra saline solution. °¨ Put medicated cream or other medicine on your wound if you have been told to do so. °¨ Put the solution soaked gauze only in your wound, not on the skin around it. °¨ Pack your wound loosely or as told by your caregiver. °¨ Put dry gauze on your wound. °¨ Put abdominal dressing pads over the dry gauze if your wet gauze soaks through. °· Tape the abdominal dressing pads in place so they will not fall off. Do not wrap the tape completely around the affected part (arm, leg, abdomen). °· Wrap the dressing pads with a flexible gauze dressing to secure it in place. °· Take off your gloves. Put them in the plastic bag with the old dressing. Tie the bag shut and throw it away. °· Keep the dressing clean and dry until your next dressing change. °· Wash your hands. °SEEK MEDICAL CARE IF: °· Your skin around the wound looks red. °· Your wound feels more tender or sore. °· You see pus in the wound. °· Your wound smells bad. °· You have a fever. °· Your skin around the wound has a rash that itches and burns. °· You see black or yellow skin in your wound that was not there before. °· You feel nauseous, throw up, and feel very tired. °Document Released: 12/22/2004 Document Revised: 02/06/2012 Document Reviewed: 09/26/2011 °ExitCare® Patient Information ©2015 [employer]. This information is not intended to replace advice given to you by your health care provider. Make sure you discuss any questions you have with your   health care provider. ° °

## 2014-08-21 NOTE — Consult Note (Addendum)
WOC ostomy consult note [facility] following for assessment and plan of care to abd wound. 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 Education provided: Daughter at bedside. Demonstrated pouch change to patinet and family member. Discussed pouch application and ordering supplies.  Pt is able to open and close velcro to empty.  Placed on [place] discharge program. 3 pouches ordered to bedside for patient and staff nurse use. Denies further questions at this time.  Pt could benefit from follow-up by home health care after discharge; please order if desired. Julien Girt MSN, RN, [place], [place], [place]

## 2014-08-21 NOTE — Progress Notes (Signed)
Dressing to midline abdominal area changed. Pt. Premedicated with Dilaudid 1mg  prior to dressing change.  Old dressing removed with minimal amount of serosanguinous drainage noted.  Noted open wound with pink tissue noted.  Wet to dry dressing applied.  Abd pad also applied.  Pt. Tolerated dressing well.    NG tube checked for residual.  No residual noted.  NG tube clamped.  NG tube flushed with 30ccs of sterile water.  Pt. Tolerated well.

## 2014-08-21 NOTE — Progress Notes (Signed)
Bowel function slowly returning.  Ambulating Clears  Imogene Burn. [name], MD, [facility]  General/ Trauma Surgery  08/21/2014 10:18 AM

## 2014-08-21 NOTE — Progress Notes (Signed)
Patient ID: Brianna Duncan, female   DOB: 06/24/1946, 68 y.o.   MRN: [id number] 7 Days Post-Op  Subjective: Pt feeling well today.  No issues with NGT clamped over the last 24 hours.  No residual.  Mobilizing well.  Objective: Vital signs in last 24 hours: Temp:  [97.7 F (36.5 C)-99.8 F (37.7 C)] 98.3 F (36.8 C) (09/24 0602) Pulse Rate:  [97-105] 97 (09/24 0602) Resp:  [16] 16 (09/24 0602) BP: (135-136)/(74-91) 135/74 mmHg (09/24 0602) SpO2:  [91 %-92 %] 92 % (09/24 0602) Last BM Date: 08/11/14  Intake/Output from previous day: 09/23 0701 - 09/24 0700 In: 1775 [I.V.:1525; IV Piggyback:250] Out: 700 [Urine:200; Emesis/NG output:500] Intake/Output this shift:    PE: Abd: soft, wound is clean and packed.  Ostomy still with nothing out, but good bowel sounds.  No NGT residual. Heart: regular Lungs: CTAB  Lab Results:   Recent Labs  08/19/14 0411  WBC 10.1  HGB 8.7*  HCT 26.7*  PLT 493*   BMET  Recent Labs  08/19/14 0411 08/21/14 0524  NA 144 139  K 3.1* 3.7  CL 105 102  CO2 22 21  GLUCOSE 72 68*  BUN 5* 4*  CREATININE 0.45* 0.48*  CALCIUM 7.6* 7.7*   PT/INR No results found for this basename: LABPROT, INR,  in the last 72 hours CMP     Component Value Date/Time   NA 139 08/21/2014 0524   K 3.7 08/21/2014 0524   CL 102 08/21/2014 0524   CO2 21 08/21/2014 0524   GLUCOSE 68* 08/21/2014 0524   BUN 4* 08/21/2014 0524   CREATININE 0.48* 08/21/2014 0524   CREATININE 0.64 04/02/2014 1720   CALCIUM 7.7* 08/21/2014 0524   PROT 6.1 08/18/2014 1643   ALBUMIN 2.1* 08/18/2014 1643   AST 25 08/18/2014 1643   ALT 13 08/18/2014 1643   ALKPHOS 99 08/18/2014 1643   BILITOT 0.5 08/18/2014 1643   GFRNONAA >90 08/21/2014 0524   GFRAA >90 08/21/2014 0524   Lipase     Component Value Date/Time   LIPASE 17 08/10/2014 1915       Studies/Results: No results found.  Anti-infectives: Anti-infectives   Start     Dose/Rate Route Frequency Ordered Stop   08/14/14 0800   cefoTEtan (CEFOTAN) 2 g in dextrose 5 % 50 mL IVPB     2 g 100 mL/hr over 30 Minutes Intravenous On call to O.R. 08/14/14 0750 08/14/14 1450   08/12/14 1800  vancomycin (VANCOCIN) 500 mg in sodium chloride 0.9 % 100 mL IVPB  Status:  Discontinued     500 mg 100 mL/hr over 60 Minutes Intravenous Every 12 hours 08/12/14 1649 08/14/14 1900   08/11/14 2200  piperacillin-tazobactam (ZOSYN) IVPB 3.375 g     3.375 g 12.5 mL/hr over 240 Minutes Intravenous 3 times per day 08/11/14 1610 08/21/14 0047   08/11/14 1700  piperacillin-tazobactam (ZOSYN) IVPB 3.375 g     3.375 g 100 mL/hr over 30 Minutes Intravenous  Once 08/11/14 1610 08/11/14 1853   08/11/14 0400  ciprofloxacin (CIPRO) IVPB 400 mg  Status:  Discontinued     400 mg 200 mL/hr over 60 Minutes Intravenous Every 12 hours 08/11/14 0206 08/11/14 1606   08/11/14 0400  metroNIDAZOLE (FLAGYL) IVPB 500 mg  Status:  Discontinued     500 mg 100 mL/hr over 60 Minutes Intravenous Every 8 hours 08/11/14 0206 08/11/14 1606       Assessment/Plan   1. POD 7, S/p laparotomy with Hartman's procedure  for perforated sigmoid colon  2. Liver mass, prior biopsy with cirrhosis  3. HTN  4. Post op ileus, resolving  5. Hypokalemia, resolved  Plan: 1. Will dc NGT today and give clear liquids 2. Cont NS WD dressing changes to midline wound BID 3. HH already set up for patient 4. Cont IV pain meds but extend to q 2 hours and start to introduce PO pain meds as she begins to take more orally. 5. A full 7 days of abx therapy completed yesterday.  No need for further abx therapy at this time.   LOS: 11 days    Pura Picinich E 08/21/2014, 8:37 AM Pager: [phone]

## 2014-08-22 NOTE — Progress Notes (Signed)
8 Days Post-Op  Subjective: She really looks good, doing well on clears.  Stool in the ostomy bag.    Objective: Vital signs in last 24 hours: Temp:  [98 F (36.7 C)-98.8 F (37.1 C)] 98 F (36.7 C) (09/25 0517) Pulse Rate:  [85-99] 91 (09/25 0517) Resp:  [18-21] 19 (09/25 0517) BP: (131-135)/(63-81) 134/63 mmHg (09/25 0517) SpO2:  [91 %-96 %] 91 % (09/25 0517) Last BM Date: 08/22/14 560 PO Afebrile, VSS BMP ok, H/h down some yesterday Intake/Output from previous day: 09/24 0701 - 09/25 0700 In: 2860 [P.O.:560; I.V.:2300] Out: -  Intake/Output this shift: Total I/O In: 240 [P.O.:240] Out: -   General appearance: alert, cooperative and no distress Resp: clear to auscultation bilaterally and down some in the bases GI: soft wound looks good and ostomy looks good, stool in the ostomy bag.  Lab Results:  No results found for this basename: WBC, HGB, HCT, PLT,  in the last 72 hours  BMET  Recent Labs  08/21/14 0524  NA 139  K 3.7  CL 102  CO2 21  GLUCOSE 68*  BUN 4*  CREATININE 0.48*  CALCIUM 7.7*   PT/INR No results found for this basename: LABPROT, INR,  in the last 72 hours   Recent Labs Lab 08/18/14 1643  AST 25  ALT 13  ALKPHOS 99  BILITOT 0.5  PROT 6.1  ALBUMIN 2.1*     Lipase     Component Value Date/Time   LIPASE 17 08/10/2014 1915     Studies/Results: No results found.  Medications: . chlorhexidine  15 mL Mouth/Throat BID  . enoxaparin (LOVENOX) injection  40 mg Subcutaneous Daily  . sodium chloride  500 mL Intravenous Once   . sodium chloride 0.9 % 1,000 mL with potassium chloride 20 mEq infusion 100 mL/hr at 08/22/14 1052   Prior to Admission medications   Medication Sig Start Date End Date Taking? Authorizing Provider  amLODipine (NORVASC) 10 MG tablet Take 1 tablet (10 mg total) by mouth daily. 07/18/14  Yes Wendie Agreste, MD  aspirin 81 MG tablet Take 81 mg by mouth daily.   Yes Historical Provider, MD  estradiol  (VIVELLE-DOT) 0.05 MG/24HR patch Place 1 patch onto the skin 2 (two) times a week.   Yes Historical Provider, MD  hyoscyamine (LEVBID) 0.375 MG 12 hr tablet Take 0.375 mg by mouth at bedtime.   Yes Historical Provider, MD  lisinopril-hydrochlorothiazide (PRINZIDE,ZESTORETIC) 20-12.5 MG per tablet Take 1 tablet by mouth daily. 07/18/14  Yes Wendie Agreste, MD  Pancrelipase, Lip-Prot-Amyl, (ZENPEP) 40000 UNITS CPEP Take 40,000 Units by mouth 3 (three) times daily with meals.    Yes Historical Provider, MD  Probiotic Product (ALIGN) 4 MG CAPS Take 4 mg by mouth daily.   Yes Historical Provider, MD  ALPRAZolam Duanne Moron) 0.5 MG tablet Take 1 tablet (0.5 mg total) by mouth 2 (two) times daily as needed for anxiety. 08/17/14   Wendie Agreste, MD     Assessment/Plan 1.  Perforated sigmoid colon; s/p . Sigmoid colectomy, End sigmoid colostomy , Vac placement 20x2 cm wound, Mobilization splenic flexure, 08/14/2014,  Rolm Bookbinder, MD  2.  Liver lesion 3.  Hypertension    Plan:  Advance diet, decrease fluids, restart home meds.  Right now I dont think she needs pancrelipase,probiotic, levabid right now.      LOS: 12 days    Brianna Duncan 08/22/2014

## 2014-08-22 NOTE — Progress Notes (Signed)
Brianna Duncan. [name], MD, [facility]  General/ Trauma Surgery  08/22/2014 4:15 PM

## 2014-08-23 MED ORDER — LORAZEPAM 1 MG PO TABS
1.0000 mg | ORAL_TABLET | Freq: Four times a day (QID) | ORAL | Status: DC | PRN
  Administered 2014-08-23 – 2014-08-24 (×3): 1 mg via ORAL
  Filled 2014-08-23 (×3): qty 1

## 2014-08-23 NOTE — Progress Notes (Signed)
9 Days Post-Op  Subjective: Patient tolerating full liquids Pain controlled with PO pain meds Ostomy functioning well  Objective: Vital signs in last 24 hours: Temp:  [98.2 F (36.8 C)-98.9 F (37.2 C)] 98.9 F (37.2 C) (09/26 0524) Pulse Rate:  [92-101] 101 (09/26 0524) Resp:  [17-18] 18 (09/26 0524) BP: (120-142)/(65-74) 132/74 mmHg (09/26 0524) SpO2:  [93 %-99 %] 99 % (09/26 0524) Last BM Date: 08/22/14  Intake/Output from previous day: 09/25 0701 - 09/26 0700 In: 720 [P.O.:720] Out: -  Intake/Output this shift:    General appearance: alert, cooperative and no distress Resp: clear to auscultation bilaterally Cardio: regular rate and rhythm, S1, S2 normal, no murmur, click, rub or gallop GI: soft, incisional tenderness Ostomy - pink; stool output;  midline wound - clean; granulating well  Lab Results:  No results found for this basename: WBC, HGB, HCT, PLT,  in the last 72 hours BMET  Recent Labs  08/21/14 0524  NA 139  K 3.7  CL 102  CO2 21  GLUCOSE 68*  BUN 4*  CREATININE 0.48*  CALCIUM 7.7*   PT/INR No results found for this basename: LABPROT, INR,  in the last 72 hours ABG No results found for this basename: PHART, PCO2, PO2, HCO3,  in the last 72 hours  Studies/Results: No results found.  Anti-infectives: Anti-infectives   Start     Dose/Rate Route Frequency Ordered Stop   08/14/14 0800  cefoTEtan (CEFOTAN) 2 g in dextrose 5 % 50 mL IVPB     2 g 100 mL/hr over 30 Minutes Intravenous On call to O.R. 08/14/14 0750 08/14/14 1450   08/12/14 1800  vancomycin (VANCOCIN) 500 mg in sodium chloride 0.9 % 100 mL IVPB  Status:  Discontinued     500 mg 100 mL/hr over 60 Minutes Intravenous Every 12 hours 08/12/14 1649 08/14/14 1900   08/11/14 2200  piperacillin-tazobactam (ZOSYN) IVPB 3.375 g     3.375 g 12.5 mL/hr over 240 Minutes Intravenous 3 times per day 08/11/14 1610 08/21/14 0047   08/11/14 1700  piperacillin-tazobactam (ZOSYN) IVPB 3.375 g     3.375 g 100 mL/hr over 30 Minutes Intravenous  Once 08/11/14 1610 08/11/14 1853   08/11/14 0400  ciprofloxacin (CIPRO) IVPB 400 mg  Status:  Discontinued     400 mg 200 mL/hr over 60 Minutes Intravenous Every 12 hours 08/11/14 0206 08/11/14 1606   08/11/14 0400  metroNIDAZOLE (FLAGYL) IVPB 500 mg  Status:  Discontinued     500 mg 100 mL/hr over 60 Minutes Intravenous Every 8 hours 08/11/14 0206 08/11/14 1606      Assessment/Plan: s/p Procedure(s): EXPLORATORY LAPAROTOMY (N/A) TOTAL COLECTOMY/COLOSTOMY Advance diet Plan for discharge tomorrow Saline lock  LOS: 13 days    Brianna Maxon K. 08/23/2014

## 2014-08-24 MED ORDER — ONDANSETRON 4 MG PO TBDP
4.0000 mg | ORAL_TABLET | Freq: Four times a day (QID) | ORAL | Status: DC | PRN
  Filled 2014-08-24: qty 1

## 2014-08-24 MED ORDER — ALPRAZOLAM 0.5 MG PO TABS
1.0000 mg | ORAL_TABLET | Freq: Three times a day (TID) | ORAL | Status: DC | PRN
  Administered 2014-08-24 – 2014-08-25 (×3): 1 mg via ORAL
  Filled 2014-08-24 (×3): qty 2

## 2014-08-24 NOTE — Progress Notes (Signed)
10 Days Post-Op  Subjective: Patient had some nausea with dinner last night, and is very anxious about going home Ostomy still functioning well No nausea currently  Objective: Vital signs in last 24 hours: Temp:  [98.1 F (36.7 C)-99 F (37.2 C)] 98.1 F (36.7 C) (09/27 0608) Pulse Rate:  [74-95] 74 (09/27 0608) Resp:  [16-18] 16 (09/27 0608) BP: (125-140)/(65-79) 129/75 mmHg (09/27 0608) SpO2:  [92 %-98 %] 98 % (09/27 0608) Last BM Date: 08/23/14  Intake/Output from previous day: 09/26 0701 - 09/27 0700 In: 760 [P.O.:760] Out: 50 [Stool:50] Intake/Output this shift:    General appearance: alert, cooperative and no distress Resp: clear to auscultation bilaterally Cardio: regular rate and rhythm, S1, S2 normal, no murmur, click, rub or gallop GI: soft, minimal tenderness Ostomy functioning well with brown pasty stool Midline wound clean, granulating  Lab Results:  No results found for this basename: WBC, HGB, HCT, PLT,  in the last 72 hours BMET No results found for this basename: NA, K, CL, CO2, GLUCOSE, BUN, CREATININE, CALCIUM,  in the last 72 hours PT/INR No results found for this basename: LABPROT, INR,  in the last 72 hours ABG No results found for this basename: PHART, PCO2, PO2, HCO3,  in the last 72 hours  Studies/Results: No results found.  Anti-infectives: Anti-infectives   Start     Dose/Rate Route Frequency Ordered Stop   08/14/14 0800  cefoTEtan (CEFOTAN) 2 g in dextrose 5 % 50 mL IVPB     2 g 100 mL/hr over 30 Minutes Intravenous On call to O.R. 08/14/14 0750 08/14/14 1450   08/12/14 1800  vancomycin (VANCOCIN) 500 mg in sodium chloride 0.9 % 100 mL IVPB  Status:  Discontinued     500 mg 100 mL/hr over 60 Minutes Intravenous Every 12 hours 08/12/14 1649 08/14/14 1900   08/11/14 2200  piperacillin-tazobactam (ZOSYN) IVPB 3.375 g     3.375 g 12.5 mL/hr over 240 Minutes Intravenous 3 times per day 08/11/14 1610 08/21/14 0047   08/11/14 1700   piperacillin-tazobactam (ZOSYN) IVPB 3.375 g     3.375 g 100 mL/hr over 30 Minutes Intravenous  Once 08/11/14 1610 08/11/14 1853   08/11/14 0400  ciprofloxacin (CIPRO) IVPB 400 mg  Status:  Discontinued     400 mg 200 mL/hr over 60 Minutes Intravenous Every 12 hours 08/11/14 0206 08/11/14 1606   08/11/14 0400  metroNIDAZOLE (FLAGYL) IVPB 500 mg  Status:  Discontinued     500 mg 100 mL/hr over 60 Minutes Intravenous Every 8 hours 08/11/14 0206 08/11/14 1606      Assessment/Plan: s/p Procedure(s): EXPLORATORY LAPAROTOMY (N/A) TOTAL COLECTOMY/COLOSTOMY Plan for discharge tomorrow Encourage ambulation PRN nausea medication   LOS: 14 days    Gionni Freese K. 08/24/2014

## 2014-08-25 ENCOUNTER — Ambulatory Visit: Payer: Medicare Other | Admitting: Family Medicine

## 2014-08-25 MED ORDER — OXYCODONE-ACETAMINOPHEN 5-325 MG PO TABS: 1.0000 | ORAL_TABLET | ORAL | Status: DC | PRN

## 2014-08-25 MED ORDER — ONDANSETRON 4 MG PO TBDP: 4.0000 mg | ORAL_TABLET | Freq: Four times a day (QID) | ORAL | Status: DC | PRN

## 2014-08-25 NOTE — Progress Notes (Signed)
Discharge Note. Discharge information reviewed with pt and pt's family at the bedside by April, RN academy nurse. All questions were answered prior to discharge. On the way out pt stated with a smile that she will see [place] back in a few months for her next surgery.

## 2014-08-25 NOTE — Discharge Summary (Signed)
Kensy Blizard, MD, MPH, FACS Trauma: [phone] General Surgery: [phone]  

## 2014-08-25 NOTE — Progress Notes (Signed)
Physical Therapy Treatment Patient Details Name: Brianna Duncan MRN: [id number] DOB: Nov 29, 1945 Today's Date: 08/25/2014    History of Present Illness 68 y.o. pt admitted on 08/10/14 with abdominal pain, diarrhea, vomiting, and weakness.  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      PT Comments    Pt was apparently D/c'd from PT then re-ordered, order acknowledged by non-therapy staff, seen today for amb and education; Pt and daughter with many questions regarding HHPT/role/goals/expectations etc.; discussed with pt and dtr and do feel pt would benefit from HHPT to work on activity tolerance and higher level balance skills;  Pt resides with her elderly sister and was responsible for most household tasks prior to adm, dtr plans to stay and assist as needed initially; I adjusted and educated pt on the correct walker ht and reason for this, discussed appropriate foot wear at home and being mindful of her small dog; Discussed home safety, throw rugs, fall risks, etc but will defer further input to HHPT .  Follow Up Recommendations  Home health PT;Supervision/Assistance - 24 hour     Equipment Recommendations  Rolling walker with 5" wheels (delivered)    Recommendations for Other Services       Precautions / Restrictions Precautions Precautions: Fall;Other (comment) Restrictions Weight Bearing Restrictions: No    Mobility  Bed Mobility Overal bed mobility: Independent                Transfers Overall transfer level: Needs assistance Equipment used: Rolling walker (2 wheeled) Transfers: Sit to/from Stand Sit to Stand: Supervision;Modified independent (Device/Increase time)         General transfer comment: initial cues for hand placement  Ambulation/Gait                 Stairs             Wheelchair Mobility    Modified Rankin (Stroke Patients Only)       Balance Overall balance assessment: Needs assistance   Sitting balance-Leahy Scale: Good     Standing balance support: During functional activity;Bilateral upper extremity supported;No upper extremity supported Standing balance-Leahy Scale: Good Standing balance comment: pt balance much improved but continues to demo higher level deficits, delayed reactions; pt dtr reports some unsteadiness prior to adm                    Cognition Arousal/Alertness: Awake/alert Behavior During Therapy: WFL for tasks assessed/performed Overall Cognitive Status: Within Functional Limits for tasks assessed                      Exercises General Exercises - Lower Extremity Ankle Circles/Pumps: AROM;Both;20 reps;Supine    General Comments        Pertinent Vitals/Pain Pain Assessment: No/denies pain    Home Living                      Prior Function            PT Goals (current goals can now be found in the care plan section) Acute Rehab PT Goals Patient Stated Goal: home, back to I lifestyle PT Goal Formulation: No goals set, d/c therapy (pt to D/C today) Potential to Achieve Goals: Good Progress towards PT goals: Goals met/education completed, patient discharged from PT  Frequency       PT Plan Current plan remains appropriate    Co-evaluation             End of Session   Activity Tolerance: Patient tolerated treatment well Patient left: in bed;with call bell/phone within reach;with family/visitor present     Time: 1130-1146 PT Time Calculation (min): 16 min  Charges:  $Self Care/Home Management: 8-22                    G Codes:      [name] September 17, 2014, 11:52 AM

## 2014-08-25 NOTE — Consult Note (Signed)
WOC ostomy consult note  CCS following for assessment and plan of care to abd wound.  Stoma type/location: Colostomy stoma to left upper quad  Stomal assessment/size: Red and viable, above skin level, 1 1/2 inches  Peristomal assessment: Intact skin surrounding  Output No stool, small amt flatus and mucous  Ostomy pouching: 1pc.  Education provided: Daughter at bedside. Pt denies offer for another pouch change demonstration.  States pouch was changed yesterday. Discussed pouch application and ordering supplies. Pt is able to open and close velcro to empty without assistance. Placed on [facility] discharge program. 3 pouches at bedside to take jome, pt for discharge today. Denies further questions at this time. Julien Girt MSN, RN, [place], [place], [place]

## 2014-08-25 NOTE — Discharge Summary (Signed)
Patient ID: [name] MRN: [id number] DOB/AGE: 01-23-1946 68 y.o.  Admit date: 08/10/2014 Discharge date: 08/25/2014  Procedures:  1. Sigmoid colectomy  2. End sigmoid colostomy  3. Vac placement 20x2 cm wound  4. Mobilization splenic flexure By Dr. Rolm Bookbinder 08-14-14  Consults: general surgery and internal medicine  Reason for Admission: Brianna Duncan is a 67 y.o. female with history of hypertension, generalized anxiety, osteoporosis, and liver lesions status post biopsy came to the ED today because of severe abdominal pain and diarrhea. Diarrhea is mostly frequent defecation, watery but small quantity at the time associated with anal pressure, no blood in stool at this time though she used to have bloody stool. She had colonoscopy in July 2015, incomplete because the GI specialist could not advance beyond a visible mass and stricture in the colon. She was diagnosed with acute colitis in August 2015 at the same time she had a liver biopsy for a lesion, but biopsy is non-diagnostic. In the ER patient had CT abdomen done which showed active colitis as reported below.  Admission Diagnoses:  1. Acute colitis 2. HTN  Hospital Course: The patient was admitted to the hospitalist service.  She was initially treated conservatively for colitis with bowel rest and IV abx therapy.  She was noted on recent colonoscopy to possibly have a sigmoid stricture at this same location as well.  After 2 days of treatment, the patient was no better.  Her WBC increased and she developed focal peritonitis.  She was then taken to the operating room where she underwent the above procedure.  She tolerated this well. She had an NGT placed during surgery.  Post operatively, she developed an ileus.  This was somewhat prolonged.  On POD 6, she had minimal NGT output, good BS, and minimal ostomy output.  Her NGT was clamped and later removed as she tolerated this well.  Her diet was then able to be  advanced as tolerated.  She completed 7 days of Zosyn as well.  Her pathology revealed benign tissue and stricture was likely felt to be secondary to ischemia, no malignancies identified.  The patient underwent routine ostomy care.  She also had an open wound that had a VAC initially, but was converted to NS WD dressing changed by time of discharge.  On POD  11, the patient was stable for dc home.  PE: Abd: soft, minimally tender, incisional wound is clean and packed.  Colostomy with good flatus and output.  Stoma is pink and viable. Discharge Diagnoses:  Principal Problem:   Acute colitis Active Problems:   Unspecified essential hypertension   Liver mass   Chronic diastolic heart failure s/p ex lap with Hartman's procedure  Discharge Medications:   Medication List         ALIGN 4 MG Caps  Take 4 mg by mouth daily.     ALPRAZolam 0.5 MG tablet  Commonly known as:  XANAX  Take 1 tablet (0.5 mg total) by mouth 2 (two) times daily as needed for anxiety.     amLODipine 10 MG tablet  Commonly known as:  NORVASC  Take 1 tablet (10 mg total) by mouth daily.     aspirin 81 MG tablet  Take 81 mg by mouth daily.     estradiol 0.05 MG/24HR patch  Commonly known as:  VIVELLE-DOT  Place 1 patch onto the skin 2 (two) times a week.     hyoscyamine 0.375 MG 12 hr tablet  Commonly known as:  LEVBID  Take 0.375 mg by mouth at bedtime.     lisinopril-hydrochlorothiazide 20-12.5 MG per tablet  Commonly known as:  PRINZIDE,ZESTORETIC  Take 1 tablet by mouth daily.     ondansetron 4 MG disintegrating tablet  Commonly known as:  ZOFRAN-ODT  Take 1 tablet (4 mg total) by mouth every 6 (six) hours as needed for nausea or vomiting.     oxyCODONE-acetaminophen 5-325 MG per tablet  Commonly known as:  PERCOCET/ROXICET  Take 1-2 tablets by mouth every 4 (four) hours as needed for moderate pain.     ZENPEP 40000 UNITS Cpep  Generic drug:  Pancrelipase (Lip-Prot-Amyl)  Take 40,000 Units by  mouth 3 (three) times daily with meals.        Discharge Instructions: Follow-up Information   Follow up with [facility], MD. Schedule an appointment as soon as possible for a visit in 3 weeks.   Specialty:  General Surgery   Contact information:   [place] [address] [phone]       Follow up with Wendie Agreste, MD In 2 weeks. (for post hospital follow up)    Specialties:  Family Medicine, Sports Medicine   Contact information:   [place] [address] [phone]       Signed: Henreitta Cea 08/25/2014, 8:15 AM

## 2014-08-26 ENCOUNTER — Telehealth: Payer: Self-pay

## 2014-08-26 NOTE — Telephone Encounter (Signed)
Brianna Duncan is calling to let Dr Carlota Raspberry know that the patient has been discharged from the hospital and is needing a follow up visit with Dr Carlota Raspberry but the next available appointment is Nov 30th.    Best#: [phone]

## 2014-08-26 NOTE — Telephone Encounter (Signed)
Called to [facility]- answering machine picks up and then hangs up.

## 2014-08-28 NOTE — Telephone Encounter (Signed)
Spoke to [employer]- pt is not wanting to sit in the waiting room for so long. She states that pt once had to wait for 3 hours to see Dr. Carlota Raspberry. Offered pt Dr. Vonna Kotyk hours for 10/2 and asked if she could be here at 8am when we open. Brianna Duncan states she will try but she does not think her mother will come in for the walk in clinic.   Please advise if pt can be worked into appt center.

## 2014-08-29 NOTE — Telephone Encounter (Signed)
Monday October 5 at 1pm with Dr. Carlota Raspberry. LM on home machine and cell number with appt date and time.

## 2014-08-29 NOTE — Telephone Encounter (Signed)
Please disregard last message. Will look at clinic schedule and see if I can't work her in prior to start of clinic, not 102.  Will discuss this with scheduling.

## 2014-08-29 NOTE — Telephone Encounter (Signed)
Given her situation and recent hospitalization, I agree that sitting for prolonged time in waiting room may not be a good idea, but I don't think we have sufficient time to work into appt if I am booked. We can fast track her to be seen by me at 102 at her convenience in next week.

## 2014-09-01 ENCOUNTER — Ambulatory Visit (INDEPENDENT_AMBULATORY_CARE_PROVIDER_SITE_OTHER): Payer: Medicare Other | Admitting: Family Medicine

## 2014-09-01 ENCOUNTER — Encounter: Payer: Self-pay | Admitting: Family Medicine

## 2014-09-01 VITALS — BP 98/54 | HR 100 | Temp 98.4°F | Resp 16 | Ht 59.0 in | Wt 123.8 lb

## 2014-09-01 DIAGNOSIS — R1084 Generalized abdominal pain: Secondary | ICD-10-CM

## 2014-09-01 DIAGNOSIS — Z09 Encounter for follow-up examination after completed treatment for conditions other than malignant neoplasm: Secondary | ICD-10-CM

## 2014-09-01 DIAGNOSIS — F411 Generalized anxiety disorder: Secondary | ICD-10-CM

## 2014-09-01 DIAGNOSIS — I10 Essential (primary) hypertension: Secondary | ICD-10-CM

## 2014-09-01 DIAGNOSIS — R160 Hepatomegaly, not elsewhere classified: Secondary | ICD-10-CM

## 2014-09-01 NOTE — Progress Notes (Signed)
Subjective:    Patient ID: Brianna Duncan, female    DOB: February 25, 1946, 68 y.o.   MRN: [id number] This chart was scribed by Cathie Hoops, ED Scribe for Ranell Patrick. Carlota Raspberry, MD in Room 25 at 1:28 PM.  HPI Brianna Duncan is a 68 y.o. female Complicated recent medical history. Hx of HTN, anxiety, chronic diastolic heart failure, and liver mass.   Abdominal Pain -  Last seen by me 07/18/13 for hospital follow up at that time (hypoxia, ischemic colitis, and acute CHF after liver biopsy) and here today after most recent hospitalization.  Hospital follow up - inpatient from 9/13 through 9/28//15.   -s/p abdominal surgery 08/14/14: She is s/p sigmoid colectomy, end sigmoid colostomy, vac placement 20x2 cm wound, and mobilization splenic flexure by Dr. Donne Hazel. Hospital course per discharge summary:   The patient was admitted to the hospitalist service. She was initially treated conservatively for colitis with bowel rest and IV abx therapy. She was noted on recent colonoscopy to possibly have a sigmoid stricture at this same location as well. After 2 days of treatment, the patient was no better. Her WBC increased and she developed focal peritonitis. She was then taken to the operating room where she underwent ex lap with Hartman's procedure above. She tolerated this well.  She had an NGT placed during surgery. Post operatively, she developed an ileus. This was somewhat prolonged. On POD 6, she had minimal NGT output, good BS, and minimal ostomy output. Her NGT was clamped and later removed as she tolerated this well. Her diet was then able to be advanced as tolerated. She completed 7 days of Zosyn as well. Her pathology revealed benign tissue and stricture was likely felt to be secondary to ischemia, no malignancies identified. The patient underwent routine ostomy care. She also had an open wound that had a VAC initially, but was converted to NS WD dressing changed by time of discharge. On POD 11, the  patient was stable for dc home. Follow up with Dr. Donne Hazel - October 14.  Pt reports taking Percocet for a few days immediately after hospitalization but denies taking any for the past two days. Pt denies abdominal pain at this time. Pt reports her GI tract is working normally. She notes [facility] is following her at home 2x/week. She has had PT. She will have a nurse visit her home to check her ostomy. She states she is changing her dressings 2x/day.   Liver lesion - followed by Dr. Earlean Shawl. Initial CT abd/pelvis - irregular colonic wall thickening - initially suspected to be metastatic colon cancer.  MRI 07/02/14 - probable primary hepatic malignancy in left hepatic lobe, hepatic and renal cortical cysts. Colonoscopy subsequently diagnosed ischemic colitis. Liver biopsy favored cirrhosis, and nodular regenerative process, not appearing to be malignancy. Lab testing performed 08/08/14 by Dr. Earlean Shawl to look for vasculitic cause of current issues. Negative ANCA panel, elevated alk phos at 131, low albumin at 3.5. Creatinine elevated at 1.18.  No m spike on SPEP, HIV1/0/2 Abs index value - 3.31 - high, with qualitative repeatedly reactive, but HIV1 and HIV 2 differentiation non-reactive.  Negative RHeum factor, Negative HCV antibody, negative ANA, RNP ab's elevated at 1.8, smith, Sjogren's antibodies negative.   Pt reports she has not made a follow-up with Dr. Earlean Shawl.    Hypertension - appeared to be running a little low at last ov and with home readings - on 07/29/14 advised to decrease amlodipine to 45m qd (1/2 of  Norvasc 30m). Continued on zestoretic 20/12.516mqd. Followed by Dr. GaEinar Giprior. S/p 2d echo in hospital in August for prior diastolic heart failure: Study Conclusions: Left ventricle: The cavity size was normal. Wall thickness was increased in a pattern of mild LVH. There was mild concentric hypertrophy. Systolic function was normal. The estimated ejection fraction was in the range of 55%  to 60%. Wall motion was normal; there were no regional wall motion abnormalities. Doppler parameters are consistent with abnormal left ventricular relaxation (grade 1 diastolic dysfunction). Mitral valve: Calcified annulus., normal pulmonary artery pressure.  Pt notes she is currently taking 10 MG of amlodipine per day. Pt denies chest pain and SOB. She notes she last saw Dr. GaEinar Giprior to September 13 and denies changing her medication routine.    Anxiety - GAD, see prior notes. Has used Xanax 1/2 -1 as needed.  Pt is visibly upset today at this visit. She has had some tearful times during the process. She is concerned if she will really start to feel better because she has no strength and feels weak. She states she feels like her outlook would be better, once her strength returns. Pt notes she eats three, light meals/day.  Pt denies taking any home antibiotics. She is ambulatory today with a walker. Pt reports her new medication includes: lisinopril, aspirin 81 mg, and Xanax as needed. She reports taking Xanax 2x/day now. She denies any adverse side effects with Xanax. Pt denies wanting a life coach at this time.  Patient Active Problem List   Diagnosis Date Noted  . Acute colitis 08/11/2014  . Chronic diastolic heart failure 0876/16/0737. Acute diastolic heart failure 0810/62/6948. Hypoxia 07/11/2014  . Colitis 07/11/2014  . Liver mass 07/11/2014  . Memory loss 04/17/2014  . Dizziness and giddiness 04/17/2014  . Unspecified essential hypertension 02/25/2013   Past Medical History  Diagnosis Date  . Hypertension   . Allergy     seasonal  . Anxiety   . Osteoporosis     Was on   . History of kidney stones   . On hormone replacement therapy     Patch   Past Surgical History  Procedure Laterality Date  . Abdominal hysterectomy  2006  . Foot surgery  2010    right foot, left great toe and left second toe surgery  . Colonoscopy  06/2014  . Liver biopsy      probable cirrhosis per  report  . Laparotomy N/A 08/14/2014    Procedure: EXPLORATORY LAPAROTOMY;  Surgeon: MaRolm BookbinderMD;  [facility]: [facility];  Laterality: N/A;  . Colectomy  08/14/2014    Procedure: TOTAL COLECTOMY/COLOSTOMY;  Surgeon: MaRolm BookbinderMD;  [facility]: General;;   Allergies  Allergen Reactions  . Ivp Dye [Iodinated Diagnostic Agents] Hives   Prior to Admission medications   Medication Sig Start Date End Date Taking? Authorizing Provider  ALPRAZolam (XDuanne Moron0.5 MG tablet Take 1 tablet (0.5 mg total) by mouth 2 (two) times daily as needed for anxiety. 08/17/14   JeWendie AgresteMD  amLODipine (NORVASC) 10 MG tablet Take 1 tablet (10 mg total) by mouth daily. 07/18/14   JeWendie AgresteMD  aspirin 81 MG tablet Take 81 mg by mouth daily.    Historical Provider, MD  estradiol (VIVELLE-DOT) 0.05 MG/24HR patch Place 1 patch onto the skin 2 (two) times a week.    Historical Provider, MD  hyoscyamine (LEVBID) 0.375 MG 12 hr  tablet Take 0.375 mg by mouth at bedtime.    Historical Provider, MD  lisinopril-hydrochlorothiazide (PRINZIDE,ZESTORETIC) 20-12.5 MG per tablet Take 1 tablet by mouth daily. 07/18/14   Wendie Agreste, MD  ondansetron (ZOFRAN-ODT) 4 MG disintegrating tablet Take 1 tablet (4 mg total) by mouth every 6 (six) hours as needed for nausea or vomiting. 08/25/14   Saverio Danker, PA-C  oxyCODONE-acetaminophen (PERCOCET/ROXICET) 5-325 MG per tablet Take 1-2 tablets by mouth every 4 (four) hours as needed for moderate pain. 08/25/14   Saverio Danker, PA-C  Pancrelipase, Lip-Prot-Amyl, (ZENPEP) 40000 UNITS CPEP Take 40,000 Units by mouth 3 (three) times daily with meals.     Historical Provider, MD  Probiotic Product (ALIGN) 4 MG CAPS Take 4 mg by mouth daily.    Historical Provider, MD   History   Social History  . Marital Status: Divorced    Spouse Name: N/A    Number of Children: 1  . Years of Education: N/A   Occupational History  . retired      retired   Social History Main Topics  . Smoking status: Former Smoker -- 0.75 packs/day for 44 years    Types: Cigarettes    Quit date: 11/29/1999  . Smokeless tobacco: Never Used     Comment: quit 15  yrs ago  . Alcohol Use: 0.6 oz/week    1 Glasses of wine per week     Comment: wine with dinner and socially  . Drug Use: No  . Sexual Activity: Yes    Birth Control/ Protection: None   Other Topics Concern  . Not on file   Social History Narrative   Patient lives at home with her sister and she is retired.   Education some college.   Right handed.   Caffeine three cups of coffee daily.   Review of Systems  Gastrointestinal: Negative for abdominal pain.  Skin: Positive for wound.  Neurological: Negative for dizziness.   Objective:   Physical Exam  Vitals reviewed. Constitutional: She is oriented to person, place, and time. She appears well-developed and well-nourished.  HENT:  Head: Normocephalic and atraumatic.  Eyes: Conjunctivae and EOM are normal. Pupils are equal, round, and reactive to light.  Neck: Carotid bruit is not present.  Cardiovascular: Normal rate, regular rhythm, normal heart sounds and intact distal pulses.   Pulmonary/Chest: Effort normal and breath sounds normal.  Abdominal: Soft. She exhibits no distension and no pulsatile midline mass. There is no tenderness.  Wet to dry in place on her midline abdominal wound with granulation tissue seen at the base with a small amount of discharge on the wet to dry dressing. No surrounding erythema, no bleeding. The ostomy bag is in place with pink mucosa at ostomy site.  Neurological: She is alert and oriented to person, place, and time.  Skin: Skin is warm and dry.  Psychiatric: She has a normal mood and affect. Her behavior is normal.   Filed Vitals:   09/01/14 1312  BP: 98/54  Pulse: 100  Temp: 98.4 F (36.9 C)  TempSrc: Oral  Resp: 16  Height: 4' 11"  (1.499 m)  Weight: 123 lb 12.8 oz (56.155 kg)  SpO2:  96%   Assessment & Plan:  1:53 PM- Patient advised of treatment plan and agrees at this time.  Brianna Duncan is a 68 y.o. female Generalized abdominal pain, Hospital discharge follow-up - surgery as above. Ostomy appears ok and wounds appear to be healing with WTD dressings. Continue surgery follow up.  rtc precautions.   Liver mass - biopsy favored cirrhosis/nodular regenerative process.  Other testing done by GI as above to look for vasculitic cause. HIV testing as above with initial reactive, but differentiation negative.  Will need to discuss further eval of these tests with GI, and may call Dr. Earlean Shawl to coordinate care.   Anxiety state - tearful in office with expected concern over recent events and whether she will get better. Discussed slow process, and expect her to improve form surgery, but continue alprazolam as needed. Recommended counseling as in past, and consider daily SSRI next ov depending on status at that time as some adjustment component at present.   Essential hypertension - decr amlodipine to 40m as discussed prior. Advised to call if systolic over 1801or under 100 (not 10 as on avs).  Recheck in 3 weeks -sooner if worse.    No orders of the defined types were placed in this encounter.   Patient Instructions  Your wounds appear to be healing.  Ok to use alprazolam as needed for anxiety or stress, but still recommend counselor as discussed prior.  Let me know if you need new numbers.  We may also consider a daily medicine at next visit. Return to the clinic or go to the nearest emergency room if any of your symptoms worsen or new symptoms occur.  Keep follow up with Dr. WDonne Hazelfor your abdominal surgery.    Take 1/2 of amlodipine (575mtotal dose), and Keep a record of your blood pressures outside of the office and bring them to the next office visit. If your blood pressures run over 160 or below 10 on upper number - let me know as we may need to change meds  further.    I will call Dr. MeEarlean Shawlo discuss follow up with him and possibly repeating some of the bloodwork from his office.   Follow up will be in about 3 weeks - we will call you to schedule this. Return to the clinic or go to the nearest emergency room if any of your symptoms worsen or new symptoms occur.   I personally performed the services described in this documentation, which was scribed in my presence. The recorded information has been reviewed and considered, and addended by me as needed.

## 2014-09-01 NOTE — Patient Instructions (Signed)
Your wounds appear to be healing.  Ok to use alprazolam as needed for anxiety or stress, but still recommend counselor as discussed prior.  Let me know if you need new numbers.  We may also consider a daily medicine at next visit. Return to the clinic or go to the nearest emergency room if any of your symptoms worsen or new symptoms occur.  Keep follow up with Dr. Donne Hazel for your abdominal surgery.    Take 1/2 of amlodipine (5mg  total dose), and Keep a record of your blood pressures outside of the office and bring them to the next office visit. If your blood pressures run over 160 or below 10 on upper number - let me know as we may need to change meds further.    I will call Dr. Earlean Shawl to discuss follow up with him and possibly repeating some of the bloodwork from his office.   Follow up will be in about 3 weeks - we will call you to schedule this. Return to the clinic or go to the nearest emergency room if any of your symptoms worsen or new symptoms occur.

## 2014-09-05 ENCOUNTER — Ambulatory Visit: Payer: Medicare Other | Admitting: Neurology

## 2014-09-09 ENCOUNTER — Ambulatory Visit: Payer: Medicare Other | Admitting: Neurology

## 2014-09-10 ENCOUNTER — Telehealth (INDEPENDENT_AMBULATORY_CARE_PROVIDER_SITE_OTHER): Payer: Self-pay

## 2014-09-10 DIAGNOSIS — E43 Unspecified severe protein-calorie malnutrition: Secondary | ICD-10-CM

## 2014-09-10 DIAGNOSIS — Z9049 Acquired absence of other specified parts of digestive tract: Secondary | ICD-10-CM

## 2014-09-10 NOTE — Telephone Encounter (Signed)
Pt seen in office today for s/p colectomy and orders placed in epic for labs.

## 2014-09-12 LAB — BASIC METABOLIC PANEL
BUN: 13 mg/dL (ref 6–23)
CO2: 28 mEq/L (ref 19–32)
Calcium: 10 mg/dL (ref 8.4–10.5)
Chloride: 98 mEq/L (ref 96–112)
Creat: 0.88 mg/dL (ref 0.50–1.10)
Glucose, Bld: 103 mg/dL — ABNORMAL HIGH (ref 70–99)
POTASSIUM: 4.6 meq/L (ref 3.5–5.3)
Sodium: 135 mEq/L (ref 135–145)

## 2014-09-12 LAB — CBC WITH DIFFERENTIAL/PLATELET
BASOS ABS: 0 10*3/uL (ref 0.0–0.1)
Basophils Relative: 0 % (ref 0–1)
Eosinophils Absolute: 0.4 10*3/uL (ref 0.0–0.7)
Eosinophils Relative: 3 % (ref 0–5)
HCT: 34 % — ABNORMAL LOW (ref 36.0–46.0)
Hemoglobin: 11 g/dL — ABNORMAL LOW (ref 12.0–15.0)
LYMPHS PCT: 26 % (ref 12–46)
Lymphs Abs: 3.5 10*3/uL (ref 0.7–4.0)
MCH: 29.2 pg (ref 26.0–34.0)
MCHC: 32.4 g/dL (ref 30.0–36.0)
MCV: 90.2 fL (ref 78.0–100.0)
Monocytes Absolute: 1.2 10*3/uL — ABNORMAL HIGH (ref 0.1–1.0)
Monocytes Relative: 9 % (ref 3–12)
NEUTROS ABS: 8.4 10*3/uL — AB (ref 1.7–7.7)
Neutrophils Relative %: 62 % (ref 43–77)
Platelets: 496 10*3/uL — ABNORMAL HIGH (ref 150–400)
RBC: 3.77 MIL/uL — ABNORMAL LOW (ref 3.87–5.11)
RDW: 14.7 % (ref 11.5–15.5)
WBC: 13.6 10*3/uL — AB (ref 4.0–10.5)

## 2014-09-13 LAB — URINALYSIS, MICROSCOPIC ONLY
Bacteria, UA: NONE SEEN
Casts: NONE SEEN
Crystals: NONE SEEN

## 2014-09-13 LAB — URINALYSIS, ROUTINE W REFLEX MICROSCOPIC
Bilirubin Urine: NEGATIVE
GLUCOSE, UA: NEGATIVE mg/dL
Hgb urine dipstick: NEGATIVE
Ketones, ur: NEGATIVE mg/dL
Nitrite: NEGATIVE
PH: 5.5 (ref 5.0–8.0)
Protein, ur: NEGATIVE mg/dL
Specific Gravity, Urine: 1.009 (ref 1.005–1.030)
Urobilinogen, UA: 0.2 mg/dL (ref 0.0–1.0)

## 2014-09-16 ENCOUNTER — Other Ambulatory Visit: Payer: Self-pay | Admitting: Family Medicine

## 2014-09-17 NOTE — Telephone Encounter (Signed)
Refilled, can sign tomorrow.

## 2014-09-18 ENCOUNTER — Other Ambulatory Visit: Payer: Self-pay | Admitting: Family Medicine

## 2014-09-18 NOTE — Telephone Encounter (Signed)
Done

## 2014-09-18 NOTE — Telephone Encounter (Signed)
Called in Rx

## 2014-09-18 NOTE — Telephone Encounter (Signed)
Patient called stating Brianna Duncan is needing approval for Xanax prescription that Dr. Carlota Raspberry wrote for her.  Please call patient back when prescription has been sent back to [facility] with approval.  Pt [phone]

## 2014-09-18 NOTE — Telephone Encounter (Signed)
Called [facility] and nothing is needed other than a RF. Called in RF as written by Dr Carlota Raspberry. Called pt and she stated that she wanted to have that called in to [facility] instead. I called and cancelled at [facility] and [facility].

## 2014-09-18 NOTE — Telephone Encounter (Signed)
[facility] [address] [facility] perferred place of pick up for medications.   [phone]

## 2014-09-26 ENCOUNTER — Telehealth: Payer: Self-pay

## 2014-09-26 DIAGNOSIS — F418 Other specified anxiety disorders: Secondary | ICD-10-CM

## 2014-09-26 NOTE — Telephone Encounter (Signed)
Dr.Greene, Pt would like to speak with you regarding getting a rx for zoloft.  [facility] [address] Best# [phone]

## 2014-09-29 NOTE — Telephone Encounter (Signed)
Pt stated that she has been having increased anxiety d/t her surgeries (recent and planned). She saw Dr Carlota Raspberry recently for anxiety and has another appt scheduled for 10/27/14, but she feels that a daily Rx would help and would really like to get started on Zoloft before then if possible. Dr Carlota Raspberry, please advise.

## 2014-09-30 MED ORDER — SERTRALINE HCL 50 MG PO TABS: 50.0000 mg | ORAL_TABLET | Freq: Every day | ORAL | Status: DC

## 2014-09-30 NOTE — Telephone Encounter (Signed)
Pt called and LM on my VM asking for status of this request. Dr Carlota Raspberry is in this afternoon and I will ask his asst to have him review message.

## 2014-09-30 NOTE — Telephone Encounter (Signed)
Called pt to discuss status.  Doing well physically, but had mentioned Zoloft as with everything that has gone.  Feels down, depressed, worrying. Denies suicidal ideation. Is doing some house cleaning, and trying to stay active and regain strength.  Follows up with surgeon next week.  Xanax is just at night at this point.  -Start Zoloft 50mg  QD - risks of fall/unsteadiness, and hyponatremia discussed.  Can check Na level at follow up in few weeks - rtc sooner if new side effects. Understanding expressed.  Has follow up with me on the 30th.

## 2014-10-02 ENCOUNTER — Other Ambulatory Visit: Payer: Self-pay | Admitting: Family Medicine

## 2014-10-03 NOTE — Telephone Encounter (Signed)
Pt called this morning to report she called pharm for RF and was hoping that Dr Carlota Raspberry could RF it today. She is concerned bc it will be the weekend and she won't have enough medication.

## 2014-10-03 NOTE — Telephone Encounter (Signed)
Done, ready for pickup.

## 2014-10-06 NOTE — Telephone Encounter (Signed)
Faxed and notified pt

## 2014-10-06 NOTE — Telephone Encounter (Signed)
I made Tim, Publishing copy aware that the TLs need to be checking the Rx response pool also to see if there are any calls that need to be made to pts.

## 2014-10-20 ENCOUNTER — Other Ambulatory Visit: Payer: Self-pay | Admitting: Family Medicine

## 2014-10-21 NOTE — Telephone Encounter (Signed)
Refilled, but I am out of office until tomorrow. Keep follow up as planned.

## 2014-10-21 NOTE — Telephone Encounter (Signed)
Pt LM on my VM asking for RF of alprazolam. Pt has appt sch for 10/27/14.

## 2014-10-21 NOTE — Telephone Encounter (Signed)
Called in Rx and notified pt who agreed to keep her appt for 11/30

## 2014-10-27 ENCOUNTER — Encounter: Payer: Self-pay | Admitting: Family Medicine

## 2014-10-27 ENCOUNTER — Ambulatory Visit (INDEPENDENT_AMBULATORY_CARE_PROVIDER_SITE_OTHER): Payer: Medicare Other | Admitting: Family Medicine

## 2014-10-27 VITALS — BP 121/63 | HR 73 | Temp 98.2°F | Resp 16 | Ht 58.5 in | Wt 124.0 lb

## 2014-10-27 DIAGNOSIS — Z23 Encounter for immunization: Secondary | ICD-10-CM

## 2014-10-27 DIAGNOSIS — F418 Other specified anxiety disorders: Secondary | ICD-10-CM

## 2014-10-27 DIAGNOSIS — I10 Essential (primary) hypertension: Secondary | ICD-10-CM

## 2014-10-27 DIAGNOSIS — K529 Noninfective gastroenteritis and colitis, unspecified: Secondary | ICD-10-CM

## 2014-10-27 MED ORDER — AMLODIPINE BESYLATE 5 MG PO TABS: 5.0000 mg | ORAL_TABLET | Freq: Every day | ORAL | Status: DC

## 2014-10-27 MED ORDER — SERTRALINE HCL 50 MG PO TABS: 50.0000 mg | ORAL_TABLET | Freq: Every day | ORAL | Status: DC

## 2014-10-27 MED ORDER — LISINOPRIL-HYDROCHLOROTHIAZIDE 20-12.5 MG PO TABS: 1.0000 | ORAL_TABLET | Freq: Every day | ORAL | Status: DC

## 2014-10-27 NOTE — Progress Notes (Signed)
Subjective:    Patient ID: Brianna Duncan, female    DOB: Jul 27, 1946, 68 y.o.   MRN: [id number] This chart was scribed for Brianna Ray, MD by Marti Sleigh, Medical Scribe. This patient was seen in Room 28 and the patient's care was started a 2:45 PM.   HPI HPI Comments: Brianna Duncan is a 68 y.o. female with a hx of abdominal surgery with colostomy placement, chronic diastolic heart failure, hypoxia, anxiety and depression, HTN, acute diastolic heart failure who presents to [facility] reporting for a follow up.   Depression and anxiety: See prior notes. Pt has xanax as needed, approximately 2 times per day at last visit in Oct. See phone call on Nov 3'd. Started zoloft 50 mg qd. At that time was using xanax just at night. Pt denies lightheadedness, or dizziness associated with her zoloft. Pt is currently taking xanax twice daily, 1 during the day and one before bed. Pt denies SI or HI.   HTN: Pt takes norovasc 10mg  1/2 pill per day. Pt takes 81mg  aspirin per day. Pt takes lisinopril HCTZ 20-12.5 MG. Last BMP Oct 14th, with creatinine at 0.88. Discussed possible hyponatremia with starting zoloft, plan on BMP today. Pt reports blood pressure has been consistent and well managed. NO new side effects of meds.   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 Earlean Shawl. Has an ostomy after abdominal surgery in September. Followed by general Surgery. Pt has an appointment with Dr. Donne Hazel, her surgeon, on January 7th. Pt denies current fever, abdominal pain, or cramping.     Patient Active Problem List   Diagnosis Date Noted  . Acute colitis 08/11/2014  . Chronic diastolic heart failure 22/12/5425  . Acute diastolic heart failure 05/21/7627  . Hypoxia 07/11/2014  . Colitis 07/11/2014  . Liver mass 07/11/2014  . Memory loss 04/17/2014  . Dizziness and giddiness 04/17/2014  .  Unspecified essential hypertension 02/25/2013   Past Medical History  Diagnosis Date  . Hypertension   . Allergy     seasonal  . Anxiety   . Osteoporosis     Was on   . History of kidney stones   . On hormone replacement therapy     Patch   Past Surgical History  Procedure Laterality Date  . Abdominal hysterectomy  2006  . Foot surgery  2010    right foot, left great toe and left second toe surgery  . Colonoscopy  06/2014  . Liver biopsy      probable cirrhosis per report  . Laparotomy N/A 08/14/2014    Procedure: EXPLORATORY LAPAROTOMY;  Surgeon: Rolm Bookbinder, MD;  Location: [place];  Service: General;  Laterality: N/A;  . Colectomy  08/14/2014    Procedure: TOTAL COLECTOMY/COLOSTOMY;  Surgeon: Rolm Bookbinder, MD;  Location: [place];  Service: General;;   Allergies  Allergen Reactions  . Ivp Dye [Iodinated Diagnostic Agents] Hives   Prior to Admission medications   Medication Sig Start Date End Date Taking? Authorizing Provider  ALPRAZolam Brianna Duncan) 0.5 MG tablet TAKE 1 TABLET BY MOUTH TWICE A DAY AS NEEDED FOR ANXIETY 10/21/14   Brianna Agreste, MD  amLODipine (NORVASC) 10 MG tablet Take 1 tablet (10 mg total) by mouth daily. 07/18/14   Brianna Agreste, MD  aspirin 81 MG tablet Take 81 mg by mouth daily.    Historical Provider, MD  estradiol (VIVELLE-DOT) 0.05 MG/24HR patch  Place 1 patch onto the skin 2 (two) times a week.    Historical Provider, MD  hyoscyamine (LEVBID) 0.375 MG 12 hr tablet Take 0.375 mg by mouth at bedtime.    Historical Provider, MD  lisinopril-hydrochlorothiazide (PRINZIDE,ZESTORETIC) 20-12.5 MG per tablet Take 1 tablet by mouth daily. 07/18/14   Brianna Agreste, MD  ondansetron (ZOFRAN-ODT) 4 MG disintegrating tablet Take 1 tablet (4 mg total) by mouth every 6 (six) hours as needed for nausea or vomiting. 08/25/14   Brianna Danker, PA-C  oxyCODONE-acetaminophen (PERCOCET/ROXICET) 5-325 MG per tablet Take 1-2 tablets by mouth every 4 (four) hours as  needed for moderate pain. 08/25/14   Brianna Danker, PA-C  Pancrelipase, Lip-Prot-Amyl, (ZENPEP) 40000 UNITS CPEP Take 40,000 Units by mouth 3 (three) times daily with meals.     Historical Provider, MD  Probiotic Product (ALIGN) 4 MG CAPS Take 4 mg by mouth daily.    Historical Provider, MD  sertraline (ZOLOFT) 50 MG tablet Take 1 tablet (50 mg total) by mouth daily. 09/30/14   Brianna Agreste, MD   History   Social History  . Marital Status: Divorced    Spouse Name: N/A    Number of Children: 1  . Years of Education: N/A   Occupational History  . retired     retired   Social History Main Topics  . Smoking status: Former Smoker -- 0.75 packs/day for 44 years    Types: Cigarettes    Quit date: 11/29/1999  . Smokeless tobacco: Never Used     Comment: quit 15  yrs ago  . Alcohol Use: 0.6 oz/week    1 Glasses of wine per week     Comment: wine with dinner and socially  . Drug Use: No  . Sexual Activity: Yes    Birth Control/ Protection: None   Other Topics Concern  . Not on file   Social History Narrative   Patient lives at home with her sister and she is retired.   Education some college.   Right handed.   Caffeine three cups of coffee daily.     Review of Systems  Constitutional: Negative for fatigue and unexpected weight change.  Respiratory: Negative for chest tightness and shortness of breath.   Cardiovascular: Negative for chest pain, palpitations and leg swelling.  Gastrointestinal: Negative for abdominal pain and blood in stool.  Neurological: Negative for dizziness, syncope, light-headedness and headaches.       Objective:   Physical Exam  Constitutional: She is oriented to person, place, and time. She appears well-developed and well-nourished.  HENT:  Head: Normocephalic and atraumatic.  Eyes: Conjunctivae and EOM are normal. Pupils are equal, round, and reactive to light.  Neck: Neck supple. Carotid bruit is not present.  Cardiovascular: Normal rate,  regular rhythm, normal heart sounds and intact distal pulses.   Pulmonary/Chest: Effort normal and breath sounds normal. No respiratory distress.  Abdominal: Soft. She exhibits no pulsatile midline mass. There is no tenderness.  Neurological: She is alert and oriented to person, place, and time.  Skin: Skin is warm and dry.  Psychiatric: She has a normal mood and affect. Her behavior is normal. Judgment and thought content normal. She expresses no suicidal ideation.  Nursing note and vitals reviewed.    Filed Vitals:   10/27/14 1402  BP: 121/63  Pulse: 73  Temp: 98.2 F (36.8 C)  TempSrc: Oral  Resp: 16  Height: 4' 10.5" (1.486 m)  Weight: 124 lb (56.246 kg)  SpO2: 98%  Assessment & Plan:   ALEERA GILCREASE is a 68 y.o. female Essential hypertension - Plan: lisinopril-hydrochlorothiazide (PRINZIDE,ZESTORETIC) 20-12.5 MG per tablet, amLODipine (NORVASC) 5 MG tablet, Basic metabolic panel  -controlled on current regimen. Same dose of Norvasc, but changed to 5mg  tablet to keep from having to split the 10mg  in half. Cont same dose of zestoretic, watch for any lows as experienced in past. BMP pending.   Depression with anxiety - Plan: sertraline (ZOLOFT) 50 MG tablet  -improved.  Continue zoloft at 50mg  Qd, XAnax BID as needed, but can try 1/2 dose and taper as sx's improve with Zoloft   Need for prophylactic vaccination against Streptococcus pneumoniae (pneumococcus) - Plan: Pneumococcal conjugate vaccine 13-valent IM given.    Meds ordered this encounter  Medications  . lisinopril-hydrochlorothiazide (PRINZIDE,ZESTORETIC) 20-12.5 MG per tablet    Sig: Take 1 tablet by mouth daily.    Dispense:  90 tablet    Refill:  1  . sertraline (ZOLOFT) 50 MG tablet    Sig: Take 1 tablet (50 mg total) by mouth daily.    Dispense:  30 tablet    Refill:  2  . amLODipine (NORVASC) 5 MG tablet    Sig: Take 1 tablet (5 mg total) by mouth daily.    Dispense:  90 tablet    Refill:   1   Patient Instructions  Keep a record of your blood pressures outside of the office and if running low, may need to change medicines further.  Follow up in next three months to discuss zoloft.  Return to the clinic or go to the nearest emergency room if any of your symptoms worsen or new symptoms occur. You should receive a call or letter about your lab results within the next week to 10 days.      I personally performed the services described in this documentation, which was scribed in my presence. The recorded information has been reviewed and considered, and addended by me as needed.

## 2014-10-27 NOTE — Patient Instructions (Signed)
Keep a record of your blood pressures outside of the office and if running low, may need to change medicines further.  Follow up in next three months to discuss zoloft.  Return to the clinic or go to the nearest emergency room if any of your symptoms worsen or new symptoms occur. You should receive a call or letter about your lab results within the next week to 10 days.

## 2014-10-28 LAB — BASIC METABOLIC PANEL
BUN: 18 mg/dL (ref 6–23)
CO2: 27 mEq/L (ref 19–32)
CREATININE: 0.61 mg/dL (ref 0.50–1.10)
Calcium: 10.1 mg/dL (ref 8.4–10.5)
Chloride: 103 mEq/L (ref 96–112)
Glucose, Bld: 82 mg/dL (ref 70–99)
Potassium: 4.6 mEq/L (ref 3.5–5.3)
Sodium: 137 mEq/L (ref 135–145)

## 2014-11-11 ENCOUNTER — Other Ambulatory Visit: Payer: Self-pay | Admitting: Family Medicine

## 2014-11-13 ENCOUNTER — Telehealth: Payer: Self-pay

## 2014-11-13 NOTE — Telephone Encounter (Signed)
LMVM reminding patient to get her flu shot.

## 2014-11-14 NOTE — Telephone Encounter (Signed)
I will fill it this time.  Please remind the patient that Dr Carlota Raspberry should be the one writing for her and in order to give him tim she should call 3-4 days in advance of when she is going to run out.  I would also recommend that she set up an appt for Dr Carlota Raspberry next month.

## 2014-11-14 NOTE — Telephone Encounter (Signed)
Pt had called and LM on VM yesterday to check status and refill had been routed to Dr Carlota Raspberry for approval. Pt left VM again this morn stating she is out of this med and would like it sent in today if possible. Judson Roch, I'm sending this to you to see if you can do this in Dr Vonna Kotyk absence today, or if we need to wait until he is back in office.

## 2014-11-14 NOTE — Telephone Encounter (Signed)
Called in Rx and notified pt. D/w her Sarah's message and she reported she talked w/ Dr Carlota Raspberry about f/up at last OV and sch appt for when he wants to see her again.

## 2014-11-24 ENCOUNTER — Other Ambulatory Visit: Payer: Self-pay | Admitting: Family Medicine

## 2014-11-26 ENCOUNTER — Other Ambulatory Visit: Payer: Self-pay | Admitting: Family Medicine

## 2014-11-30 ENCOUNTER — Other Ambulatory Visit: Payer: Self-pay | Admitting: Family Medicine

## 2014-12-02 ENCOUNTER — Other Ambulatory Visit: Payer: Self-pay | Admitting: Physician Assistant

## 2014-12-02 NOTE — Telephone Encounter (Signed)
Signed, ready for pick up

## 2014-12-02 NOTE — Telephone Encounter (Signed)
faxed

## 2014-12-04 NOTE — Telephone Encounter (Signed)
Pt called and reported pharm said they did not received the Rx. Called it in.

## 2014-12-05 NOTE — Telephone Encounter (Signed)
This should have already been prescribed.  i ordered this on 12/02/14. Let me know if it did not get called in or picked up.

## 2014-12-07 NOTE — Telephone Encounter (Signed)
Yes this was prescribed. This RF request came over before it was

## 2014-12-20 ENCOUNTER — Telehealth: Payer: Self-pay | Admitting: Family Medicine

## 2014-12-22 NOTE — Telephone Encounter (Signed)
Refilled. Due for follow up in February.

## 2014-12-22 NOTE — Telephone Encounter (Signed)
Script called into the pharmacy

## 2014-12-22 NOTE — Telephone Encounter (Signed)
Pt calling in regards to this medication, declined leaving a message with me, and requested Barbara's VM. Transferred to Vm

## 2014-12-23 ENCOUNTER — Other Ambulatory Visit (INDEPENDENT_AMBULATORY_CARE_PROVIDER_SITE_OTHER): Payer: Self-pay | Admitting: General Surgery

## 2014-12-23 DIAGNOSIS — R160 Hepatomegaly, not elsewhere classified: Secondary | ICD-10-CM

## 2014-12-23 NOTE — Telephone Encounter (Signed)
Notified pt rx was called in. Pt has appt sch for 2/29.

## 2014-12-31 ENCOUNTER — Other Ambulatory Visit (INDEPENDENT_AMBULATORY_CARE_PROVIDER_SITE_OTHER): Payer: Self-pay | Admitting: General Surgery

## 2014-12-31 DIAGNOSIS — K529 Noninfective gastroenteritis and colitis, unspecified: Secondary | ICD-10-CM

## 2014-12-31 NOTE — Addendum Note (Signed)
Addended byRolm Bookbinder on: 12/31/2014 11:05 AM   Modules accepted: Orders

## 2015-01-01 ENCOUNTER — Other Ambulatory Visit (INDEPENDENT_AMBULATORY_CARE_PROVIDER_SITE_OTHER): Payer: Self-pay | Admitting: General Surgery

## 2015-01-01 ENCOUNTER — Other Ambulatory Visit (INDEPENDENT_AMBULATORY_CARE_PROVIDER_SITE_OTHER): Payer: Self-pay

## 2015-01-01 ENCOUNTER — Other Ambulatory Visit (INDEPENDENT_AMBULATORY_CARE_PROVIDER_SITE_OTHER): Payer: Self-pay | Admitting: *Deleted

## 2015-01-01 ENCOUNTER — Ambulatory Visit
Admission: RE | Admit: 2015-01-01 | Discharge: 2015-01-01 | Disposition: A | Discharge status: Home / Self Care | Payer: Medicare Other | Source: Ambulatory Visit | Attending: General Surgery | Admitting: General Surgery

## 2015-01-01 DIAGNOSIS — R160 Hepatomegaly, not elsewhere classified: Secondary | ICD-10-CM

## 2015-01-01 DIAGNOSIS — K529 Noninfective gastroenteritis and colitis, unspecified: Secondary | ICD-10-CM

## 2015-01-01 MED ORDER — IOHEXOL 350 MG/ML SOLN
100.0000 mL | Freq: Once | INTRAVENOUS | Status: AC | PRN
  Administered 2015-01-01: 100 mL via INTRAVENOUS

## 2015-01-26 ENCOUNTER — Ambulatory Visit (INDEPENDENT_AMBULATORY_CARE_PROVIDER_SITE_OTHER): Payer: Medicare Other | Admitting: Family Medicine

## 2015-01-26 ENCOUNTER — Encounter: Payer: Self-pay | Admitting: Family Medicine

## 2015-01-26 ENCOUNTER — Telehealth: Payer: Self-pay

## 2015-01-26 VITALS — BP 135/77 | HR 96 | Temp 98.5°F | Resp 16 | Ht 58.5 in | Wt 145.0 lb

## 2015-01-26 DIAGNOSIS — I10 Essential (primary) hypertension: Secondary | ICD-10-CM

## 2015-01-26 DIAGNOSIS — F418 Other specified anxiety disorders: Secondary | ICD-10-CM | POA: Diagnosis not present

## 2015-01-26 MED ORDER — SERTRALINE HCL 100 MG PO TABS: 100.0000 mg | ORAL_TABLET | Freq: Every day | ORAL | Status: AC

## 2015-01-26 MED ORDER — SERTRALINE HCL 100 MG PO TABS: 50.0000 mg | ORAL_TABLET | Freq: Every day | ORAL | Status: DC

## 2015-01-26 MED ORDER — ALPRAZOLAM 0.5 MG PO TABS: 0.5000 mg | ORAL_TABLET | Freq: Two times a day (BID) | ORAL | Status: DC

## 2015-01-26 NOTE — Progress Notes (Signed)
Subjective:  This chart was scribed for Wendie Agreste, MD by Tamsen Roers, at [facility]. This patient was seen in room 24 and the patient's care was started at 2:54 PM.    Patient ID: Brianna Duncan, female    DOB: 06/12/1946, 69 y.o.   MRN: [id number]  HPI  HPI Comments: Brianna Duncan is a 69 y.o. female who presents to [facility] for  a follow up.  Last visit with me was November 2015.  Patient notes that she has been doing well.  Saw Dr. Donne Hazel on the 24 th, had her lab work done as well as a CT scan.     Hypertension:  At previous visit she was on 5 mg Norvasc each day and Lisinopril HCT 20-12.5 mg QD.  Last renal function normal November 2015.  Recent blood work within the past two weeks at surgeons office including normal creatnine at 0.81.  Patient has not been checking her blood pressure at home.  She denies lightheadedness, dizziness, or any chest pain.    Depression with Anxiety:  See prior notes.  She has a complicated medical history last year with hospital evaluations, abdominal surgeries and colostomy placement as well as diastolic heart failure.  We started Zoloft November 3rd.  She denies any new side effects with that medication and at that time she was using Xanax twice daily. Her overall symptoms were improved.  Continues Xanax twice a day as needed but with plans to decrease to half a dose with further tapering as symptoms improved.  We had discussed meeting with a therapist in the past but declined meeting with therapist at prior visits.  Patient notes she is still taking the Zoloft and states she overall has had a good mood lately, but is still requiring a full Xanax twice daily and has a few left. She would like to try to use a little less of Xanax eventually.  She would like to go up on her dose of Zoloft.  Denies any suicidal or homicidal thoughts.     Abdominal Pain:  See prior office visits. Phone note  reviewed from Dr. Donne Hazel from 3 days ago.  Suspected liver cirrhosis.  Residual para sternal hernia with plan for repair but referred to Dr. Ardis Hughs for GI follow up.  Per phone notes will be set up with his office in the next few weeks. She notes she has a lot of skin irritation where her colostomy bag is.   Patient notes that a couple weeks ago she felt like she had a sinus infection with associated symptoms of rhinorrhea,congestion and epitaxies and is wondering what she can use for her symptoms if they recur. No current sinus sx's.      Patient Active Problem List   Diagnosis Date Noted  . Acute colitis 08/11/2014  . Chronic diastolic heart failure 34/19/6222  . Acute diastolic heart failure 97/98/9211  . Hypoxia 07/11/2014  . Colitis 07/11/2014  . Liver mass 07/11/2014  . Memory loss 04/17/2014  . Dizziness and giddiness 04/17/2014  . Unspecified essential hypertension 02/25/2013   Past Medical History  Diagnosis Date  . Hypertension   . Allergy     seasonal  . Anxiety   . Osteoporosis     Was on   . History of kidney stones   . On hormone replacement therapy     Patch   Past Surgical History  Procedure Laterality Date  . Abdominal hysterectomy  2006  . Foot surgery  2010    right foot, left great toe and left second toe surgery  . Colonoscopy  06/2014  . Liver biopsy      probable cirrhosis per report  . Laparotomy N/A 08/14/2014    Procedure: EXPLORATORY LAPAROTOMY;  Surgeon: Rolm Bookbinder, MD;  Location: [facility];  Service: General;  Laterality: N/A;  . Colectomy  08/14/2014    Procedure: TOTAL COLECTOMY/COLOSTOMY;  Surgeon: Rolm Bookbinder, MD;  Location: [place];  Service: General;;   Allergies  Allergen Reactions  . Ivp Dye [Iodinated Diagnostic Agents] Hives   Prior to Admission medications   Medication Sig Start Date End Date Taking? Authorizing Provider  ALPRAZolam Duanne Moron) 0.5 MG tablet TAKE 1 TABLET BY MOUTH TWICE A DAY 12/22/14   Wendie Agreste, MD    amLODipine (NORVASC) 5 MG tablet Take 1 tablet (5 mg total) by mouth daily. 10/27/14   Wendie Agreste, MD  aspirin 81 MG tablet Take 81 mg by mouth daily.    Historical Provider, MD  estradiol (VIVELLE-DOT) 0.05 MG/24HR patch Place 1 patch onto the skin 2 (two) times a week.    Historical Provider, MD  lisinopril-hydrochlorothiazide (PRINZIDE,ZESTORETIC) 20-12.5 MG per tablet Take 1 tablet by mouth daily. 10/27/14   Wendie Agreste, MD  Probiotic Product (ALIGN) 4 MG CAPS Take 4 mg by mouth daily.    Historical Provider, MD  sertraline (ZOLOFT) 50 MG tablet Take 1 tablet (50 mg total) by mouth daily. 10/27/14   Wendie Agreste, MD  sertraline (ZOLOFT) 50 MG tablet TAKE 1 TABLET BY MOUTH EVERY DAY 11/27/14   Mancel Bale, PA-C   History   Social History  . Marital Status: Divorced    Spouse Name: N/A  . Number of Children: 1  . Years of Education: N/A   Occupational History  . retired     retired   Social History Main Topics  . Smoking status: Former Smoker -- 0.75 packs/day for 44 years    Types: Cigarettes    Quit date: 11/29/1999  . Smokeless tobacco: Never Used     Comment: quit 15  yrs ago  . Alcohol Use: 0.6 oz/week    1 Glasses of wine per week     Comment: wine with dinner and socially  . Drug Use: No  . Sexual Activity: Yes    Birth Control/ Protection: None   Other Topics Concern  . Not on file   Social History Narrative   Patient lives at home with her sister and she is retired.   Education some college.   Right handed.   Caffeine three cups of coffee daily.     Review of Systems  Constitutional: Negative for fatigue and unexpected weight change.  Respiratory: Negative for chest tightness and shortness of breath.   Cardiovascular: Negative for chest pain, palpitations and leg swelling.  Gastrointestinal: Negative for abdominal pain and blood in stool.  Neurological: Negative for dizziness, syncope, light-headedness and headaches.       Objective:    Physical Exam  Constitutional: She is oriented to person, place, and time. She appears well-developed and well-nourished.  HENT:  Head: Normocephalic and atraumatic.  Eyes: Conjunctivae and EOM are normal. Pupils are equal, round, and reactive to light.  Neck: Carotid bruit is not present.  Cardiovascular: Normal rate, regular rhythm, normal heart sounds and intact distal pulses.   Pulmonary/Chest: Effort normal and breath sounds normal.  Abdominal: Soft. She exhibits no pulsatile midline  mass. There is no tenderness.  Colostomy in place on abdomen.   Neurological: She is alert and oriented to person, place, and time.  Skin: Skin is warm and dry.  Psychiatric: She has a normal mood and affect. Her behavior is normal.  Vitals reviewed.  Filed Vitals:   01/26/15 1435  BP: 135/77  Pulse: 96  Temp: 98.5 F (36.9 C)  TempSrc: Oral  Resp: 16  Height: 4' 10.5" (1.486 m)  Weight: 145 lb (65.772 kg)  SpO2: 92%       Assessment & Plan:   Brianna Duncan is a 69 y.o. female Depression with anxiety - Plan: ALPRAZolam (XANAX) 0.5 MG tablet, sertraline (ZOLOFT) 100 MG tablet, DISCONTINUED: sertraline (ZOLOFT) 100 MG tablet  -d/t persistent xanax need, agreed to try higher zoloft dose. SED and rtc precautions regarding this medicine were discussed, but risks of continued xanax use also discussed. Plan on follow up in 6 months - sooner if change in symptoms.   Essential hypertension  -stable. No dose changes.  Abdominal pain, s/p colostomy. Continue follow up with surgeon for hernia, and gastroenterology for liver disease. rtc precautions.   Advised to try otc saline NS prn congestion, but to RTC if face pain, fever, or persistent purulent nasal discharge.   Meds ordered this encounter  Medications  . DISCONTD: sertraline (ZOLOFT) 100 MG tablet    Sig: Take 0.5 tablets (50 mg total) by mouth daily.    Dispense:  30 tablet    Refill:  2  . ALPRAZolam (XANAX) 0.5 MG tablet     Sig: Take 1 tablet (0.5 mg total) by mouth 2 (two) times daily. As needed    Dispense:  60 tablet    Refill:  1    Not to exceed 5 additional fills before 06/02/2015  . sertraline (ZOLOFT) 100 MG tablet    Sig: Take 1 tablet (100 mg total) by mouth daily.    Dispense:  30 tablet    Refill:  5   Patient Instructions  Keep follow up as planned with Dr. Donne Hazel, and Dr. Ardis Hughs. We will try higher dose of Zoloft. Xanax if needed, but try to decrease dose as symptoms improve with Zoloft change.  Return to the clinic or go to the nearest emergency room if any of your symptoms worsen or new symptoms occur. Follow up with me in next 6 months. Sooner if needed.     I personally performed the services described in this documentation, which was scribed in my presence. The recorded information has been reviewed and considered, and addended by me as needed.

## 2015-01-26 NOTE — Patient Instructions (Addendum)
Keep follow up as planned with Dr. Donne Hazel, and Dr. Ardis Hughs. We will try higher dose of Zoloft. Xanax if needed, but try to decrease dose as symptoms improve with Zoloft change.  Return to the clinic or go to the nearest emergency room if any of your symptoms worsen or new symptoms occur. Follow up with me in next 6 months. Sooner if needed.

## 2015-01-26 NOTE — Telephone Encounter (Signed)
-----  Message from Milus Banister, MD sent at 01/23/2015  9:02 AM EST ----- Happy to see her, Matt.  I'll let you know what I think.  Anjeanette Petzold, Can you set up ngi appt with myself (next avail) or with extender (backed by me regardless of who is covering that day) in next few weeks.  Thanks.  Can you also contact her about having all [facility]'s records sent over for review so we don't repeat any testing just recently done.  Thanks    ----- Message -----    From: Rolm Bookbinder, MD    Sent: 01/23/2015   8:58 AM      To: Milus Banister, MD  Nicanor Bake can review her chart and see what you think. Labs mostly normal. Underwent long evaluation by [facility] (she does not really want to see him anymore) for a possible liver lesion.  She does appear to have cirrhosis.  I met her emergently as she what appeared to be ischemic colitis. She required surgery and had a hartmanns as she was ill.  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  I would like to have someone see her for liver disease first to see if anything else to do.  Would you be willing to see her?  If not any recommendations? Thank you. Matt

## 2015-01-27 MED ORDER — LISINOPRIL-HYDROCHLOROTHIAZIDE 20-12.5 MG PO TABS: 1.0000 | ORAL_TABLET | Freq: Every day | ORAL | Status: AC

## 2015-01-27 MED ORDER — AMLODIPINE BESYLATE 5 MG PO TABS: 5.0000 mg | ORAL_TABLET | Freq: Every day | ORAL | Status: AC

## 2015-01-27 NOTE — Telephone Encounter (Signed)
Left message on machine to call back  

## 2015-01-27 NOTE — Telephone Encounter (Signed)
Pt has been sch with Amy for 02/04/15 she will bring records this week for review

## 2015-01-27 NOTE — Addendum Note (Signed)
Addended by: Merri Ray R on: 01/27/2015 12:41 PM   Modules accepted: Orders

## 2015-02-04 ENCOUNTER — Other Ambulatory Visit (INDEPENDENT_AMBULATORY_CARE_PROVIDER_SITE_OTHER): Payer: Medicare Other

## 2015-02-04 ENCOUNTER — Ambulatory Visit (INDEPENDENT_AMBULATORY_CARE_PROVIDER_SITE_OTHER): Payer: Medicare Other | Admitting: Physician Assistant

## 2015-02-04 ENCOUNTER — Encounter: Payer: Self-pay | Admitting: Physician Assistant

## 2015-02-04 VITALS — BP 128/72 | HR 76 | Ht 59.0 in | Wt 148.0 lb

## 2015-02-04 DIAGNOSIS — R938 Abnormal findings on diagnostic imaging of other specified body structures: Secondary | ICD-10-CM

## 2015-02-04 DIAGNOSIS — R9389 Abnormal findings on diagnostic imaging of other specified body structures: Secondary | ICD-10-CM

## 2015-02-04 DIAGNOSIS — K7469 Other cirrhosis of liver: Secondary | ICD-10-CM | POA: Diagnosis not present

## 2015-02-04 LAB — CBC WITH DIFFERENTIAL/PLATELET
BASOS PCT: 0.4 % (ref 0.0–3.0)
Basophils Absolute: 0.1 10*3/uL (ref 0.0–0.1)
EOS PCT: 1.8 % (ref 0.0–5.0)
Eosinophils Absolute: 0.2 10*3/uL (ref 0.0–0.7)
HEMATOCRIT: 38.8 % (ref 36.0–46.0)
HEMOGLOBIN: 13.3 g/dL (ref 12.0–15.0)
Lymphocytes Relative: 17.3 % (ref 12.0–46.0)
Lymphs Abs: 2.1 10*3/uL (ref 0.7–4.0)
MCHC: 34.2 g/dL (ref 30.0–36.0)
MCV: 92 fl (ref 78.0–100.0)
Monocytes Absolute: 1.1 10*3/uL — ABNORMAL HIGH (ref 0.1–1.0)
Monocytes Relative: 9.4 % (ref 3.0–12.0)
NEUTROS ABS: 8.6 10*3/uL — AB (ref 1.4–7.7)
Neutrophils Relative %: 71.1 % (ref 43.0–77.0)
PLATELETS: 316 10*3/uL (ref 150.0–400.0)
RBC: 4.22 Mil/uL (ref 3.87–5.11)
RDW: 15.6 % — ABNORMAL HIGH (ref 11.5–15.5)
WBC: 12 10*3/uL — ABNORMAL HIGH (ref 4.0–10.5)

## 2015-02-04 LAB — COMPREHENSIVE METABOLIC PANEL
ALBUMIN: 4.5 g/dL (ref 3.5–5.2)
ALK PHOS: 116 U/L (ref 39–117)
ALT: 28 U/L (ref 0–35)
AST: 40 U/L — ABNORMAL HIGH (ref 0–37)
BUN: 19 mg/dL (ref 6–23)
CO2: 25 meq/L (ref 19–32)
Calcium: 10.2 mg/dL (ref 8.4–10.5)
Chloride: 101 mEq/L (ref 96–112)
Creatinine, Ser: 0.69 mg/dL (ref 0.40–1.20)
GFR: 89.72 mL/min (ref >60.00)
GLUCOSE: 101 mg/dL — AB (ref 70–99)
POTASSIUM: 3.8 meq/L (ref 3.5–5.1)
SODIUM: 135 meq/L (ref 135–145)
Total Bilirubin: 0.4 mg/dL (ref 0.2–1.2)
Total Protein: 8 g/dL (ref 6.0–8.3)

## 2015-02-04 LAB — AMMONIA: Ammonia: 25 umol/L (ref 11–35)

## 2015-02-04 NOTE — Patient Instructions (Signed)
Please go to the basement level to have your labs drawn.  You have been scheduled for an endoscopy. Please follow written instructions given to you at your visit today. If you use inhalers (even only as needed), please bring them with you on the day of your procedure. Your physician has requested that you go to www.startemmi.com and enter the access code given to you at your visit today. This web site gives a general overview about your procedure. However, you should still follow specific instructions given to you by our office regarding your preparation for the procedure.  We made you a follow up appointment with Dr Ardis Hughs for 04-07-2015 at 10:30 am .  We can put you on a cancellation list for a sooner appointment.

## 2015-02-04 NOTE — Progress Notes (Signed)
Patient ID: Brianna Duncan, female   DOB: [birth year], 69 y.o.   MRN: [id number]   Subjective:    Patient ID: Brianna Duncan, female    DOB: 01/06/1946, 69 y.o.   MRN: [id number]  HPI Brianna Duncan is a pleasant 69 year old white female new to GI today referred by Dr. Serita Grammes to Dr. Ardis Hughs for evaluation of new diagnosis of cirrhosis and abnormal liver imaging. Patient was originally evaluated by Dr. Earlean Shawl in the summer of 2015 after an acute episode of nausea vomiting and diarrhea July 2 hospitalization. He had abnormal CT of the liver and large intestine at that time. Colonoscopy was attempted by Dr. Earlean Shawl to evaluate for possible metastatic colon cancer that the procedure was limited due to sigmoidoscopy because of acute ischemic colitis with stricturing preventing passage of the colonoscope. She ultimately underwent a M[place] colectomy and end colostomy by Dr. Donne Hazel in September 2015 and path did not show any evidence of malignancy she did have acute ulceration and perforation. She also had biopsy of the liver done which showed changes consistent with cirrhosis. She had follow-up posthospitalization with Dr. Earlean Shawl and she underwent further workup to rule out an underlying vasculitis perhaps causing insults to her liver and large intestine. Alpha-fetoprotein level was normal ProTime also normal and extensive labs were obtained. ANCA panel was negative rheumatoid factor normal hepatitis C antibody negative and a was positive anti-DNA antibody negative RNP antibody high at 1.8 Sjogren's antibodies anti-centromere antibodies and scleroderma antibodies all negative complement levels normal. Patient has slowly recovered from her surgery had a lot of postoperative fatigue which she says is gradually improving. Her appetite has been better and she has actually gained about 25 pounds since her lowest weight postoperatively. Is no complaints of abdominal pain fluid retention. Patient had no previous  diagnosis of liver disease. Her most recent CT of the abdomen and pelvis was done 01/01/2015 showed a shrunken nodular liver consistent with cirrhosis and altered perfusion in the left lobe oral vein mildly dilated this was felt to be consistent with cirrhosis also has cholelithiasis. Patient is anticipating repair of her peristomal hernia and takedown of her colostomy per Dr. Donne Hazel who wanted her to be evaluated from a GI standpoint prior to surgery. On careful questioning of the patient and her daughter patient does have a long history of regular alcohol use. She has been drinking on an almost daily basis since her 52s but usually drinks wine with her evening meals. She has at least 2 glasses of wine every evening and is still doing so. She has history of heavier alcohol use on the weekends which had been for many years as well dating back into her 42s. She states she will generally have 3-4 glasses of wine per day on the weekends.  Review of Systems Pertinent positive and negative review of systems were noted in the above HPI section.  All other review of systems was otherwise negative.  Outpatient Encounter Prescriptions as of 02/04/2015  Medication Sig  . ALPRAZolam (XANAX) 0.5 MG tablet Take 1 tablet (0.5 mg total) by mouth 2 (two) times daily. As needed  . amLODipine (NORVASC) 5 MG tablet Take 1 tablet (5 mg total) by mouth daily.  Marland Kitchen aspirin 81 MG tablet Take 81 mg by mouth daily.  Marland Kitchen estradiol (VIVELLE-DOT) 0.05 MG/24HR patch Place 1 patch onto the skin 2 (two) times a week.  Marland Kitchen lisinopril-hydrochlorothiazide (PRINZIDE,ZESTORETIC) 20-12.5 MG per tablet Take 1 tablet by mouth daily.  . Probiotic Product (ALIGN)  4 MG CAPS Take 4 mg by mouth daily.  . sertraline (ZOLOFT) 100 MG tablet Take 1 tablet (100 mg total) by mouth daily.   Allergies  Allergen Reactions  . Ivp Dye [Iodinated Diagnostic Agents] Hives   Patient Active Problem List   Diagnosis Date Noted  . Acute colitis 08/11/2014    . Chronic diastolic heart failure 18/29/9371  . Acute diastolic heart failure 69/67/8938  . Hypoxia 07/11/2014  . Colitis 07/11/2014  . Liver mass 07/11/2014  . Memory loss 04/17/2014  . Dizziness and giddiness 04/17/2014  . Unspecified essential hypertension 02/25/2013   History   Social History  . Marital Status: Divorced    Spouse Name: N/A  . Number of Children: 1  . Years of Education: N/A   Occupational History  . retired     retired   Social History Main Topics  . Smoking status: Former Smoker -- 0.75 packs/day for 44 years    Types: Cigarettes    Quit date: 11/29/1999  . Smokeless tobacco: Never Used     Comment: quit 15  yrs ago  . Alcohol Use: 0.6 oz/week    1 Glasses of wine per week     Comment: wine with dinner and socially  . Drug Use: No  . Sexual Activity: Yes    Birth Control/ Protection: None   Other Topics Concern  . Not on file   Social History Narrative   Patient lives at home with her sister and she is retired.   Education some college.   Right handed.   Caffeine three cups of coffee daily.    Ms. Duncan's family history includes Heart disease in her brother and mother; Stroke in her father and sister.      Objective:    Filed Vitals:   02/04/15 1329  BP: 128/72  Pulse: 76    Physical Exam  well-developed older white female in no acute distress, accomp; nontraumatic normocephalic EOMI PERRLA sclera anicteric, Supple; no JVD, Cardiovascular; regular rate and rhythm with S1-S2 no murmur rub or gallop, Pulmonary ;clear bilaterally, Abdomen; soft, nondistended no appreciable fluid wave no palpable mass or hepatosplenomegaly right lobe of the liver is palpable 2 fingerbreadths below the right costal margin nontender, she does have a colostomy in the left lower quadrant with peristomal hernia. Extremities; no clubbing cyanosis or edema she does have palmar erythema, Neuropsych; no asterixis mood and affect appropriate       Assessment  & Plan:   #1 69 yo female with new diagnosis of cirrhosis 06/2014 which appears compensated. She has had abnormal imaging initially concerning for malignancy with altered perfusion of left lobe . 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 #3 peristomal hernia #4 HTN  Plan; AFP,PT, ammonia,CBC, CMET, CDT level . Advised pt to stop drinking alcohol altogether.  Schedule for EGD with Dr. Ardis Hughs to assess for varices  Dr. Ardis Hughs to review prior MRI, and most recent CT and determine if follow up MRI indicated at this time prior to surgery to reverse colostomy.   Kaya Klausing S Kue Fox PA-C 02/04/2015   Cc: Wendie Agreste, MD

## 2015-02-05 ENCOUNTER — Telehealth: Payer: Self-pay

## 2015-02-05 LAB — CARBOHYDRATE DEFICIENT TRANSFERRIN
%CDT: 2.3 % (ref <2.5)
CDT: 73.6 mg/L (ref 28.1–76.0)
TRANSFERRIN (CDT): 325 mg/dL (ref 188–341)

## 2015-02-05 LAB — AFP TUMOR MARKER: AFP-Tumor Marker: 2.8 ng/mL (ref <6.1)

## 2015-02-05 NOTE — Telephone Encounter (Signed)
Left message on machine to call back  Regarding rescheduling the procedure to endo colon and previsit.

## 2015-02-05 NOTE — Telephone Encounter (Signed)
Ok...harder for her to come two separate times ... Will defer to Dr. Ardis Hughs ...guess leave her on in current slot for EGD.

## 2015-02-05 NOTE — Progress Notes (Signed)
Amy, I agree with the above note, plan.  I think we should probably also plan on colonoscopy at the same time as EGD since her previous one was incomplete and she is being evaluated to reverse her colostomy.  Can you set that up as well (same time as EGD), will need to have 2 spots available to put it in.  thanks

## 2015-02-05 NOTE — Telephone Encounter (Signed)
Brianna Duncan and Dr Jac Canavan pt does not want to do both procedures at the same time.

## 2015-02-05 NOTE — Telephone Encounter (Signed)
Ok, we can do them separately if that is what she prefers.  Thanks

## 2015-02-05 NOTE — Telephone Encounter (Signed)
-----   Message from Alfredia Ferguson, PA-C sent at 02/05/2015 10:40 AM EST ----- Ok...will get her added on for colonoscopy also.  Kingjames Coury- I saw this pt yesterday and we scheduled her for EGD with Dr. Ardis Hughs... He would like her to have colonoscopy also at the same time. Can you please get her scheduled  For EGD and Colon ?   Thank you...obviously will need prep instructions etc..  ----- Message -----    From: Milus Banister, MD    Sent: 02/05/2015   7:32 AM      To: Alfredia Ferguson, PA-C    ----- Message -----    From: Alfredia Ferguson, PA-C    Sent: 02/04/2015   3:19 PM      To: Milus Banister, MD

## 2015-02-05 NOTE — Telephone Encounter (Signed)
Patient advised. She will keep the appointment for the EGD. She will wait for more information on the colonoscopy. She says thank you!

## 2015-02-09 ENCOUNTER — Encounter: Payer: Self-pay | Admitting: Gastroenterology

## 2015-02-09 ENCOUNTER — Ambulatory Visit (AMBULATORY_SURGERY_CENTER): Payer: Medicare Other | Admitting: Gastroenterology

## 2015-02-09 VITALS — BP 131/68 | HR 74 | Temp 96.8°F | Resp 19 | Ht 59.0 in | Wt 148.0 lb

## 2015-02-09 DIAGNOSIS — K2990 Gastroduodenitis, unspecified, without bleeding: Secondary | ICD-10-CM | POA: Diagnosis not present

## 2015-02-09 DIAGNOSIS — K7469 Other cirrhosis of liver: Secondary | ICD-10-CM

## 2015-02-09 DIAGNOSIS — K2970 Gastritis, unspecified, without bleeding: Secondary | ICD-10-CM

## 2015-02-09 DIAGNOSIS — K2940 Chronic atrophic gastritis without bleeding: Secondary | ICD-10-CM | POA: Diagnosis not present

## 2015-02-09 MED ORDER — OMEPRAZOLE 20 MG PO CPDR: 20.0000 mg | DELAYED_RELEASE_CAPSULE | Freq: Every day | ORAL | Status: AC

## 2015-02-09 MED ORDER — SODIUM CHLORIDE 0.9 % IV SOLN: 500.0000 mL | INTRAVENOUS | Status: DC

## 2015-02-09 NOTE — Op Note (Signed)
[place]  [address]. [place], [id number]   ENDOSCOPY PROCEDURE REPORT  PATIENT: Brianna Duncan, Brianna Duncan  MR#: [id number] BIRTHDATE: 01-Dec-1945 , 68  yrs. old GENDER: female ENDOSCOPIST: Milus Banister, MD REFERRED BY:  Rolm Bookbinder, M.D. PROCEDURE DATE:  02/09/2015 PROCEDURE:  EGD w/ biopsy ASA CLASS:     Class III INDICATIONS:  Recently found to have cirrhosis (proven with liver biopsy), unclear etiology after extensive workup with Dr.  Earlean Shawl; daily etoh intake for many years. MEDICATIONS: Monitored anesthesia care, Propofol 200 mg IV, and lidocaine 100mg  IV TOPICAL ANESTHETIC: none  DESCRIPTION OF PROCEDURE: After the risks benefits and alternatives of the procedure were thoroughly explained, informed consent was obtained.  The LB [id number] endoscope was introduced through the mouth and advanced to the second portion of the duodenum , Without limitations.  The instrument was slowly withdrawn as the mucosa was fully examined.  There was moderate pan-gastritis that did not appear typical for portal gastropathy.  The distal stomach was biopsied and sent to pathology.  The pylorus was somewhat snug but I was able to advance through it easily with adult gastroscope.  There was a 2cm hiatal hernia and mild reflux related esophagitis.  The examination was otherwise normal (no esophageal or gastric varices).  Retroflexed views revealed no abnormalities.     The scope was then withdrawn from the patient and the procedure completed.  COMPLICATIONS: There were no immediate complications.  ENDOSCOPIC IMPRESSION: There was moderate pan-gastritis that did not appear typical for portal gastropathy.  The distal stomach was biopsied and sent to pathology.  The pylorus was somewhat snug but I was able to advance through it easily with adult gastroscope.  There was a 2cm hiatal hernia and mild reflux related esophagitis.  The examination was otherwise  normal (no esophageal or gastric varices)  RECOMMENDATIONS: Await final pathology. My office will set up colonoscopy to clear your colon, screeing prior to potential colostomy takedown.  This will be via ostomy and also via anus.  My office will also set up return visit in 3-4 weeks to go over all of your findings. You should try to refrain from more than 1-2 alcohol beverages per month or so (alcohol MAY have caused your chronic liver damage).   eSigned:  Milus Banister, MD 02/09/2015 2:07 PM    CC: Zada Girt, MD

## 2015-02-09 NOTE — Patient Instructions (Signed)
YOU HAD AN ENDOSCOPIC PROCEDURE TODAY AT [facility]:   Refer to the procedure report that was given to you for any specific questions about what was found during the examination.  If the procedure report does not answer your questions, please call your gastroenterologist to clarify.  If you requested that your care partner not be given the details of your procedure findings, then the procedure report has been included in a sealed envelope for you to review at your convenience later.  YOU SHOULD EXPECT: Some feelings of bloating in the abdomen. Passage of more gas than usual.  Walking can help get rid of the air that was put into your GI tract during the procedure and reduce the bloating. If you had a lower endoscopy (such as a colonoscopy or flexible sigmoidoscopy) you may notice spotting of blood in your stool or on the toilet paper. If you underwent a bowel prep for your procedure, you may not have a normal bowel movement for a few days.  Please Note:  You might notice some irritation and congestion in your nose or some drainage.  This is from the oxygen used during your procedure.  There is no need for concern and it should clear up in a day or so.  SYMPTOMS TO REPORT IMMEDIATELY:    Following upper endoscopy (EGD)  Vomiting of blood or coffee ground material  New chest pain or pain under the shoulder blades  Painful or persistently difficult swallowing  New shortness of breath  Fever of 100F or higher  Black, tarry-looking stools  For urgent or emergent issues, a gastroenterologist can be reached at any hour by calling [phone].   DIET: Your first meal following the procedure should be a small meal and then it is ok to progress to your normal diet. Heavy or fried foods are harder to digest and may make you feel nauseous or bloated.  Likewise, meals heavy in dairy and vegetables can increase bloating.  Drink plenty of fluids but you should avoid alcoholic beverages  for 24 hours.  ACTIVITY:  You should plan to take it easy for the rest of today and you should NOT DRIVE or use heavy machinery until tomorrow (because of the sedation medicines used during the test).    FOLLOW UP: Our staff will call the number listed on your records the next business day following your procedure to check on you and address any questions or concerns that you may have regarding the information given to you following your procedure. If we do not reach you, we will leave a message.  However, if you are feeling well and you are not experiencing any problems, there is no need to return our call.  We will assume that you have returned to your regular daily activities without incident.  If any biopsies were taken you will be contacted by phone or by letter within the next 1-3 weeks.  Please call us at [phone] if you have not heard about the biopsies in 3 weeks.    SIGNATURES/CONFIDENTIALITY: You and/or your care partner have signed paperwork which will be entered into your electronic medical record.  These signatures attest to the fact that that the information above on your After Visit Summary has been reviewed and is understood.  Full responsibility of the confidentiality of this discharge information lies with you and/or your care-partner.  Recommendations Colonoscopy scheduled and instructions given. Gastritis and hiatal hernia handouts provided.  Discharge instructions given to patient and/or  care partner.

## 2015-02-09 NOTE — Progress Notes (Signed)
Report to PACU, RN, vss, BBS= Clear.  

## 2015-02-09 NOTE — Progress Notes (Signed)
Called to room to assist during endoscopic procedure.  Patient ID and intended procedure confirmed with present staff. Received instructions for my participation in the procedure from the performing physician.  

## 2015-02-10 ENCOUNTER — Telehealth: Payer: Self-pay | Admitting: *Deleted

## 2015-02-10 NOTE — Telephone Encounter (Signed)
Pre visit and colon have been scheduled

## 2015-02-10 NOTE — Telephone Encounter (Signed)
  Follow up Call-  Call back number 02/09/2015  Post procedure Call Back phone  # [phone]  Permission to leave phone message Yes     Patient questions:  Message left to call us if necessary.

## 2015-02-16 ENCOUNTER — Other Ambulatory Visit: Payer: Self-pay

## 2015-02-16 MED ORDER — CLARITHROMYCIN 500 MG PO TABS: 500.0000 mg | ORAL_TABLET | Freq: Two times a day (BID) | ORAL | Status: DC

## 2015-02-16 MED ORDER — AMOXICILLIN 500 MG PO TABS: 1000.0000 mg | ORAL_TABLET | Freq: Two times a day (BID) | ORAL | Status: DC

## 2015-02-17 ENCOUNTER — Other Ambulatory Visit: Payer: Self-pay | Admitting: Family Medicine

## 2015-02-17 ENCOUNTER — Encounter: Payer: Self-pay | Admitting: Gastroenterology

## 2015-02-17 NOTE — Telephone Encounter (Signed)
Error

## 2015-02-24 ENCOUNTER — Ambulatory Visit (AMBULATORY_SURGERY_CENTER): Payer: Self-pay | Admitting: *Deleted

## 2015-02-24 VITALS — Ht <= 58 in | Wt 154.8 lb

## 2015-02-24 DIAGNOSIS — Z1211 Encounter for screening for malignant neoplasm of colon: Secondary | ICD-10-CM

## 2015-02-24 MED ORDER — MOVIPREP 100 G PO SOLR: 1.0000 | Freq: Once | ORAL | Status: DC

## 2015-02-24 NOTE — Progress Notes (Signed)
No egg or soy allergy 02-09-2015 had propofol with egd in [place] with no issues  No home 02 No diet pills No issues with past sedation emmi video declined by pt  Colonoscopy for screening prior to colostomy take down . Daughter in [place] with her mom, questions asked and answered, encouraged to call with further questions or concerns

## 2015-03-06 ENCOUNTER — Ambulatory Visit (AMBULATORY_SURGERY_CENTER): Payer: Medicare Other | Admitting: Gastroenterology

## 2015-03-06 ENCOUNTER — Encounter: Payer: Self-pay | Admitting: Gastroenterology

## 2015-03-06 VITALS — BP 103/58 | HR 75 | Temp 96.6°F | Resp 16 | Ht <= 58 in | Wt 154.0 lb

## 2015-03-06 DIAGNOSIS — D122 Benign neoplasm of ascending colon: Secondary | ICD-10-CM

## 2015-03-06 DIAGNOSIS — D120 Benign neoplasm of cecum: Secondary | ICD-10-CM

## 2015-03-06 DIAGNOSIS — Z8719 Personal history of other diseases of the digestive system: Secondary | ICD-10-CM | POA: Diagnosis not present

## 2015-03-06 DIAGNOSIS — Z1211 Encounter for screening for malignant neoplasm of colon: Secondary | ICD-10-CM

## 2015-03-06 MED ORDER — SODIUM CHLORIDE 0.9 % IV SOLN: 500.0000 mL | INTRAVENOUS | Status: DC

## 2015-03-06 NOTE — Progress Notes (Signed)
A/ox3 pleased with MAC, report to Tracy RN 

## 2015-03-06 NOTE — Progress Notes (Signed)
Colonoscopy done per colostomy stoma & per anus

## 2015-03-06 NOTE — Op Note (Signed)
[place]  [address]. [place], [id number]   COLONOSCOPY PROCEDURE REPORT  PATIENT: Brianna Duncan, Brianna Duncan  MR#: [id number] BIRTHDATE: July 15, 1946 , 68  yrs. old GENDER: female ENDOSCOPIST: Milus Banister, MD REFERRED BC:[id number] Donne Hazel, M.D. PROCEDURE DATE:  03/06/2015 PROCEDURE:   Colonoscopy with snare polypectomy First Screening Colonoscopy - Avg.  risk and is 50 yrs.  old or older - No.  Prior Negative Screening - Now for repeat screening. N/A  History of Adenoma - Now for follow-up colonoscopy & has been > or = to 3 yrs.  N/A ASA CLASS:   Class III INDICATIONS:hartman's pouch, sigmoid colectomy 2015 Dr.  Donne Hazel (done for benign sigmoid stricture). MEDICATIONS: Monitored anesthesia care and Propofol 300 mg IV  DESCRIPTION OF PROCEDURE:   After the risks benefits and alternatives of the procedure were thoroughly explained, informed consent was obtained.  The digital rectal exam revealed no abnormalities of the rectum.   The LB 1528  endoscope was introduced through the anus and advanced to the Hartman's pouch. Following that, the scope was inserted into the left sided ostomy and advanced to the cecum, which was identified by both the appendix and ileocecal valve. No adverse events experienced.   The quality of the prep was excellent.  The instrument was then slowly withdrawn as the colon was fully examined.   COLON FINDINGS: Three sessile polyps ranging between 5-23mm in size were found in the ascending colon and at the cecum.  Polypectomies were performed with a cold snare.  The resection was complete, the polyp tissue was completely retrieved and sent to histology.   The examination was otherwise normal.  Retroflexed views revealed no abnormalities. The time to cecum = NA Withdrawal time = NA   The scope was withdrawn and the procedure completed. COMPLICATIONS: There were no immediate complications.  ENDOSCOPIC IMPRESSION: 1.   Three sessile  polyps ranging between 5-33mm in size were found in the ascending colon and at the cecum; polypectomies were performed with a cold snare 2.   The examination was otherwise normal (evaluation included Hartman's pouch via anus and remaining colon via ostomy)  RECOMMENDATIONS: 1. If the polyp(s) removed today are proven to be adenomatous (pre-cancerous) polyps, you will need a colonoscopy in 3-5 years. Otherwise you should continue to follow colorectal cancer screening guidelines for "routine risk" patients with a colonoscopy in 10 years.  You will receive a letter within 1-2 weeks with the results of your biopsy as well as final recommendations.  Please call my office if you have not received a letter after 3 weeks. 2. OK to proceed with colostomy takedown with Dr. Donne Hazel.  eSigned:  Milus Banister, MD 03/06/2015 3:34 PM

## 2015-03-06 NOTE — Progress Notes (Signed)
Called to room to assist during endoscopic procedure.  Patient ID and intended procedure confirmed with present staff. Received instructions for my participation in the procedure from the performing physician.  

## 2015-03-06 NOTE — Patient Instructions (Signed)
Impressions/recommendations:  Polyps (handout given) Repeat colonoscopy pending pathology. Ok to proceed with colostomy takedown.  YOU HAD AN ENDOSCOPIC PROCEDURE TODAY AT [facility]:   Refer to the procedure report that was given to you for any specific questions about what was found during the examination.  If the procedure report does not answer your questions, please call your gastroenterologist to clarify.  If you requested that your care partner not be given the details of your procedure findings, then the procedure report has been included in a sealed envelope for you to review at your convenience later.  YOU SHOULD EXPECT: Some feelings of bloating in the abdomen. Passage of more gas than usual.  Walking can help get rid of the air that was put into your GI tract during the procedure and reduce the bloating. If you had a lower endoscopy (such as a colonoscopy or flexible sigmoidoscopy) you may notice spotting of blood in your stool or on the toilet paper. If you underwent a bowel prep for your procedure, you may not have a normal bowel movement for a few days.  Please Note:  You might notice some irritation and congestion in your nose or some drainage.  This is from the oxygen used during your procedure.  There is no need for concern and it should clear up in a day or so.  SYMPTOMS TO REPORT IMMEDIATELY:   Following lower endoscopy (colonoscopy or flexible sigmoidoscopy):  Excessive amounts of blood in the stool  Significant tenderness or worsening of abdominal pains  Swelling of the abdomen that is new, acute  Fever of 100F or higher  For urgent or emergent issues, a gastroenterologist can be reached at any hour by calling [phone].   DIET: Your first meal following the procedure should be a small meal and then it is ok to progress to your normal diet. Heavy or fried foods are harder to digest and may make you feel nauseous or bloated.  Likewise, meals heavy  in dairy and vegetables can increase bloating.  Drink plenty of fluids but you should avoid alcoholic beverages for 24 hours.  ACTIVITY:  You should plan to take it easy for the rest of today and you should NOT DRIVE or use heavy machinery until tomorrow (because of the sedation medicines used during the test).    FOLLOW UP: Our staff will call the number listed on your records the next business day following your procedure to check on you and address any questions or concerns that you may have regarding the information given to you following your procedure. If we do not reach you, we will leave a message.  However, if you are feeling well and you are not experiencing any problems, there is no need to return our call.  We will assume that you have returned to your regular daily activities without incident.  If any biopsies were taken you will be contacted by phone or by letter within the next 1-3 weeks.  Please call us at [phone] if you have not heard about the biopsies in 3 weeks.    SIGNATURES/CONFIDENTIALITY: You and/or your care partner have signed paperwork which will be entered into your electronic medical record.  These signatures attest to the fact that that the information above on your After Visit Summary has been reviewed and is understood.  Full responsibility of the confidentiality of this discharge information lies with you and/or your care-partner.

## 2015-03-09 ENCOUNTER — Telehealth: Payer: Self-pay | Admitting: *Deleted

## 2015-03-09 NOTE — Telephone Encounter (Signed)
  Follow up Call-no answer, left message to call if questions or concerns.     

## 2015-03-11 ENCOUNTER — Encounter: Payer: Self-pay | Admitting: Gastroenterology

## 2015-04-07 ENCOUNTER — Ambulatory Visit (INDEPENDENT_AMBULATORY_CARE_PROVIDER_SITE_OTHER): Payer: Medicare Other | Admitting: Gastroenterology

## 2015-04-07 ENCOUNTER — Encounter: Payer: Self-pay | Admitting: Gastroenterology

## 2015-04-07 VITALS — BP 106/68 | HR 80 | Ht <= 58 in | Wt 160.6 lb

## 2015-04-07 DIAGNOSIS — K7030 Alcoholic cirrhosis of liver without ascites: Secondary | ICD-10-CM

## 2015-04-07 NOTE — Patient Instructions (Signed)
Please return to see Dr. Ardis Hughs in 6 months (will need labs a day or two prior; cbc, cmet, AFP, INR). Continue to abstain from too much alcohol drinking. Good luck with your colostomy reversal.

## 2015-04-07 NOTE — Progress Notes (Signed)
Review of pertinent gastrointestinal problems: 1. 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 Earlean Shawl; had 'mass in liver' felt possibly cancer, eventual liver biopsy 06/2014 showed advanced cirrhosis; mass may have been perfusion defect.  He did extensive lab workup to exclude other causes of cirrhosis: (viral, autoimmune, iron overload and othe; Alpha-fetoprotein level was normal ProTime also normal and extensive labs were obtained. ANCA panel was negative rheumatoid factor normal hepatitis C antibody negative and a was positive anti-DNA antibody negative RNP antibody high at 1.8 Sjogren's antibodies anti-centromere antibodies and scleroderma antibodies all negative complement levels normal)  Last EGD: 01/2015 no portal HTN signs  Last AFP: 01/2015 normal  Last liver imaging: 12/2014 CT scan showed cirrhosis, no signs of liver cancer, "There is very altered perfusion in theleft lobe of the liver, particularly adjacent to the falciformligament, as discussed above. This is favored to reflect benignperfusion abnormality. Future followup examinations should beperformed as MRI of the abdomen with and without IV gadolinium(preferably Eovist) for increased sensitivity and specificity with respect to surveillance for hepatocellular carcinoma" 2. Adenomatous polyps: Dr. Ardis Hughs colonoscopy 02/2015 founds 3 small adenomas, recall at 3 years. 3. H. Pylori + gastritis: EGD Dr. Ardis Hughs 01/2015, treated with prev-pac Abx; no signs of portal HTN on examination. 4. Ischemic colitis: underwent colonoscopy attempt Dr. Earlean Shawl 2015, imaging had suggested colon mass with abnormal liver (presumed met colon cancer), unable to advance scope past stricture. Eventual segmental colectomy and Hartman's pouch Donne Hazel 07/2014), final path showed no cancer.  HPI: This is a very pleasant 69 year old woman whom I last saw time of colonoscopy about 2 months ago.  Chief complaint is cirrhosis  She has  very well compensated cirrhosis without any signs of edema, ascites.  She has normal platelets. EGD showed no signs of portal gastropathy or portal hypertension  We discussed the nature of her cirrhosis. It is probably from previous alcohol abuse. She has had extensive workup ruling out essentially  viral causes, autoimmune disease.  She has significantly cut back the amount of alcohol she drinks regularly and is down to 1 or 2 glasses of wine per week only   Past Medical History  Diagnosis Date  . Hypertension   . Allergy     seasonal  . Anxiety   . Osteoporosis     Was on   . History of kidney stones   . On hormone replacement therapy     Patch  . Depression   . Chronic kidney disease     kidney stones  . H. pylori infection     02-09-2015 egd   . Colostomy in place   . Acute colitis     [facility]-Dr Donne Hazel   . CHF (congestive heart failure)     resolved with no medicines currently   . GERD (gastroesophageal reflux disease)   . 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 Abdominal hysterectomy  2006  . Foot surgery  2010    right foot, left great toe and left second toe surgery  . Colonoscopy  06/2014  . Liver biopsy      probable cirrhosis per report  . Laparotomy N/A 08/14/2014    Procedure: EXPLORATORY LAPAROTOMY;  Surgeon: Rolm Bookbinder, MD;  Location: [facility];  Service: General;  Laterality: N/A;  . Colectomy  08/14/2014    Procedure: TOTAL COLECTOMY/COLOSTOMY;  Surgeon: Rolm Bookbinder, MD;  Location: [facility];  Service: General;;  . Ganglion cyst excision Left     Current Outpatient Prescriptions  Medication Sig Dispense Refill  . ALPRAZolam (XANAX) 0.5 MG tablet Take 1 tablet (0.5 mg total) by mouth 2 (two) times daily. As needed 60 tablet 1  . amLODipine (NORVASC) 5 MG tablet Take 1 tablet (5 mg total) by mouth daily. 90 tablet 0  . aspirin 81 MG tablet Take 81 mg by mouth daily.    Marland Kitchen estradiol  (VIVELLE-DOT) 0.05 MG/24HR patch Place 1 patch onto the skin 2 (two) times a week.    Marland Kitchen lisinopril-hydrochlorothiazide (PRINZIDE,ZESTORETIC) 20-12.5 MG per tablet Take 1 tablet by mouth daily. 90 tablet 0  . Multiple Vitamin (MULTIVITAMIN) tablet Take 1 tablet by mouth daily.    Marland Kitchen omeprazole (PRILOSEC) 20 MG capsule Take 1 capsule (20 mg total) by mouth daily. 90 capsule 3  . Probiotic Product (PROBIOTIC DAILY PO) Take 1 tablet by mouth daily.    . sertraline (ZOLOFT) 100 MG tablet Take 1 tablet (100 mg total) by mouth daily. 30 tablet 5   No current facility-administered medications for this visit.    Allergies as of 04/07/2015 - Review Complete 04/07/2015  Allergen Reaction Noted  . Ivp dye [iodinated diagnostic agents] Hives 01/18/2012    Family History  Problem Relation Age of Onset  . Heart disease Mother   . Stroke Father   . Stroke Sister   . Heart disease Brother   . Colon cancer Neg Hx   . Rectal cancer Neg Hx   . Stomach cancer Neg Hx   . Esophageal cancer Neg Hx     History   Social History  . Marital Status: Divorced    Spouse Name: N/A  . Number of Children: 1  . Years of Education: N/A   Occupational History  . retired     retired   Social History Main Topics  . Smoking status: Former Smoker -- 0.75 packs/day for 44 years    Types: Cigarettes    Quit date: 11/29/1999  . Smokeless tobacco: Never Used     Comment: quit 15  yrs ago  . Alcohol Use: 0.6 oz/week    1 Glasses of wine per week     Comment: wine with dinner and socially  . Drug Use: No  . Sexual Activity: Yes    Birth Control/ Protection: None   Other Topics Concern  . Not on file   Social History Narrative   Patient lives at home with her sister and she is retired.   Education some college.   Right handed.   Caffeine three cups of coffee daily.     Physical Exam: BP 106/68 mmHg  Pulse 80  Ht 4' 10"  (1.473 m)  Wt 160 lb 9.6 oz (72.848 kg)  BMI 33.57 kg/m2 Constitutional:  generally well-appearing Psychiatric: alert and oriented x3 Abdomen: soft, nontender, nondistended, no obvious ascites, no peritoneal signs, normal bowel sounds   Assessment and plan: 69 y.o. female with cirrhosis, likely from alcohol abuse in the past; left-sided colostomy with Hartman's pouch from ischemic colitis 2015  She has very well compensated cirrhosis, normal platelets, no sign of portal hypertension on EGD, no edema in her legs or ascites. I think she will do well with colostomy reversal upcoming. Also they're planning hernia repair from what I understand. She knows she should try to continue to not drink much alcohol. Currently she has 2 glasses of wine per week and I explained that  that is probably safe but I would not want her to drink anymore and that routine basis. She will return to see me in 6 months. She will have CBC, complete metabolic profile, alpha-fetoprotein, INR done shortly beforehand. From there we'll schedule her for repeat liver imaging, screening for hepatoma. I favor MRI as she has had unusual perfusion defects in her liver that will probably give the best picture.   Owens Loffler, MD [facility] 04/07/2015, 10:37 AM

## 2015-04-24 ENCOUNTER — Other Ambulatory Visit: Payer: Self-pay | Admitting: General Surgery

## 2015-04-24 NOTE — H&P (Signed)
Keith Rake. Eickholt 04/21/2015 11:13 AM Location: [facility] Patient #: [id number] DOB: 1946/03/15 Divorced / Language: Cleophus Molt / Race: White Female History of Present Illness Leighton Ruff MD; 8/58/8502 11:51 AM) Patient words: colostomy reversal per MW.  The patient is a 69 year old female presenting for a post-operative visit. This is a 69 year old female who is status post emergent Hartman's procedure in September 2015. She had a prolonged postoperative course due to severe deconditioning prior to surgery. She was discharged approximately 2 weeks after surgery. 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 She was also noted to have an enlarged liver and biopsies confirm liver cirrhosis. She saw Dr. Ardis Hughs in the office for evaluation of her cirrhosis. He also performed a colonoscopy in April, which showed 3 small adenomatous polyps. She will need another colonoscopy in 3 years. 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 She has significantly reduced her alcohol consumption to approximately 2 glasses of wine per week. He will follow her on a biannual basis. Currently she is having a lot of difficulty with pouching. She has a parastomal hernia edges making it difficult for her to keep the pouch in place. She denies any obstructive symptoms or bleeding.  Problem List/Past Medical Leighton Ruff, MD; 7/74/1287 3:53 PM) POST-OPERATIVE STATE [phone])  Other Problems Leighton Ruff, MD; 0/96/2836 3:53 PM) Kidney Stone High blood pressure Other disease, cancer, significant illness Oophorectomy Bilateral. COLITIS (558.9  K52.9) Anxiety Disorder Congestive Heart Failure Arthritis  Past Surgical History Leighton Ruff, MD; 05/26/4764 3:53 PM) Oral Surgery Hysterectomy (not due to cancer) - Complete Foot Surgery Bilateral.  Diagnostic  Studies History Leighton Ruff, MD; 4/65/0354 3:53 PM) Mammogram within last year Colonoscopy within last year  Allergies Mammie Lorenzo, LPN; 6/56/8127 51:70 AM) Iodinated Contrast Media  Medication History Leighton Ruff, MD; 0/17/4944 3:53 PM) Xanax (0.5MG  Tablet, Oral) Active. Lisinopril-Hydrochlorothiazide (20-12.5MG  Tablet, Oral) Active. Aspirin (81MG  Tablet, 1 (one) Oral) Active. Vivelle-Dot (0.05MG /24HR Patch TW, Transdermal) Active. Norvasc (10MG  Tablet, Oral) Active. Omeprazole (20MG  Capsule DR, Oral) Active. Sertraline HCl (100MG  Tablet, Oral) Active. Probiotic (Oral) Active. Multivitamins (Oral) Active. Medications Reconciled Neomycin Sulfate (500MG  Tablet, 2 (two) Tablet Oral SEE NOTE, Taken starting 04/21/2015) Active. (TAKE TWO TABLETS AT 2 PM, 3 PM, AND 10 PM THE DAY PRIOR TO SURGERY) Flagyl (500MG  Tablet, 2 (two) Tablet Oral SEE NOTE, Taken starting 04/21/2015) Active. (Take at 2pm, 3pm, and 10pm the day prior to your colon operation) Percocet (5-325MG  Tablet, 1 (one) Tablet Oral every four hours, as needed, Taken starting 09/10/2014) Active. TraMADol HCl (50MG  Tablet, 1 (one) Tablet Oral every eight hours, Taken starting 09/10/2014) Active. Zofran ODT (4MG  Tablet Disperse, 1 (one) Tablet Disperse Oral every four hours, as needed, Taken starting 09/17/2014) Active. Norvasc (5MG  Tablet, Oral) Active. Zoloft (50MG  Tablet, Oral) Active. Levbid (0.375MG  Tablet ER 12HR, Oral) Active. Zofran (4MG  Tablet, Oral) Active.  Social History Leighton Ruff, MD; 9/67/5916 3:53 PM) Caffeine use Coffee. Alcohol use Moderate alcohol use. Tobacco use Former smoker. No drug use  Family History Leighton Ruff, MD; 3/84/6659 3:53 PM) Arthritis Mother. Cerebrovascular Accident Father, Sister. Bleeding disorder Mother, Sister. Diabetes Mellitus Brother, Father. Respiratory Condition Father. Heart disease in female family member before age 105 Heart  Disease Brother. Kidney Disease Brother, Sister. Hypertension Brother, Father, Mother, Sister.  Pregnancy / Birth History Leighton Ruff, MD; 9/35/7017 3:53 PM) Age at menarche 41 years. Para  70 Gravida 1 Age of menopause 19-55 Maternal age 27-20 Irregular periods     Review of Systems Leighton Ruff MD; 3/33/5456 3:54 PM) General Present- Fatigue, Night Sweats and Weight Loss. Not Present- Appetite Loss, Chills, Fever and Weight Gain. Skin Present- New Lesions. Not Present- Change in Wart/Mole, Dryness, Hives, Jaundice, Non-Healing Wounds, Rash and Ulcer. HEENT Present- Wears glasses/contact lenses. Not Present- Earache, Hearing Loss, Hoarseness, Nose Bleed, Oral Ulcers, Ringing in the Ears, Seasonal Allergies, Sinus Pain, Sore Throat, Visual Disturbances and Yellow Eyes. Respiratory Not Present- Bloody sputum, Chronic Cough, Difficulty Breathing, Snoring and Wheezing. Breast Not Present- Breast Mass, Breast Pain, Nipple Discharge and Skin Changes. Cardiovascular Present- Swelling of Extremities. Not Present- Chest Pain, Difficulty Breathing Lying Down, Leg Cramps, Palpitations, Rapid Heart Rate and Shortness of Breath. Gastrointestinal Present- Abdominal Pain, Change in Bowel Habits and Nausea. Not Present- Bloating, Bloody Stool, Chronic diarrhea, Constipation, Difficulty Swallowing, Excessive gas, Gets full quickly at meals, Hemorrhoids, Indigestion, Rectal Pain and Vomiting. Female Genitourinary Present- Nocturia. Not Present- Frequency, Painful Urination, Pelvic Pain and Urgency. Musculoskeletal Present- Swelling of Extremities. Not Present- Back Pain, Joint Pain, Joint Stiffness, Muscle Pain and Muscle Weakness. Neurological Present- Decreased Memory and Weakness. Not Present- Fainting, Headaches, Numbness, Seizures, Tingling, Tremor and Trouble walking. Psychiatric Present- Anxiety. Not Present- Bipolar, Change in Sleep Pattern, Depression, Fearful and Frequent  crying. Endocrine Not Present- Cold Intolerance, Excessive Hunger, Hair Changes, Heat Intolerance, Hot flashes and New Diabetes. Hematology Present- Easy Bruising. Not Present- Excessive bleeding, Gland problems, HIV and Persistent Infections.  Vitals Claiborne Billings Dockery LPN; 2/56/3893 73:42 AM) 04/21/2015 11:13 AM Weight: 163.2 lb Height: 59in Body Surface Area: 1.76 m Body Mass Index: 32.96 kg/m Temp.: 98.1F(Oral)  Pulse: 105 (Regular)  BP: 124/82 (Sitting, Left Arm, Standard)     Physical Exam Leighton Ruff MD; 8/76/8115 3:55 PM)  General Mental Status-Alert. General Appearance-Consistent with stated age. Hydration-Well hydrated. Voice-Normal.  Head and Neck Head-normocephalic, atraumatic with no lesions or palpable masses. Trachea-midline. Thyroid Gland Characteristics - normal size and consistency.  Eye Eyeball - Bilateral-Extraocular movements intact. Sclera/Conjunctiva - Bilateral-No scleral icterus.  Chest and Lung Exam Chest and lung exam reveals -quiet, even and easy respiratory effort with no use of accessory muscles and on auscultation, normal breath sounds, no adventitious sounds and normal vocal resonance. Inspection Chest Wall - Normal. Back - normal.  Cardiovascular Cardiovascular examination reveals -normal heart sounds, regular rate and rhythm with no murmurs and normal pedal pulses bilaterally.  Abdomen Inspection Inspection of the abdomen reveals - Note: LLQ ostomy with noted parastomal hernia. Palpation/Percussion Palpation and Percussion of the abdomen reveal - Soft, Non Tender, No Rebound tenderness, No Rigidity (guarding) and No hepatosplenomegaly. Auscultation Auscultation of the abdomen reveals - Bowel sounds normal.  Neurologic Neurologic evaluation reveals -alert and oriented x 3 with no impairment of recent or remote memory. Mental Status-Normal.  Musculoskeletal Global Assessment -Note:no gross  deformities.  Normal Exam - Left-Upper Extremity Strength Normal and Lower Extremity Strength Normal. Normal Exam - Right-Upper Extremity Strength Normal and Lower Extremity Strength Normal.    Assessment & Plan Leighton Ruff MD; 06/22/2034 10:39 AM)  COLOSTOMY IN PLACE (V44.3  Z93.3) Impression: Pt is now medically ready for colostomy reversal. Dr Donne Hazel has asked me to perform this reversal. We discussed this in detail today. We will attempt to perform laparoscopically and close parastomal hernia primarily. I discussed this with Dr. Donne Hazel and he would like to perform a underlay mesh repair with physio-mesh. We will try to get this  scheduled as a joint procedure. The surgery and anatomy were described to the patient as well as the risks of surgery and the possible complications. These include: Bleeding, deep abdominal infections and possible wound complications such as hernia and infection, damage to adjacent structures, leak of surgical connections, which can lead to other surgeries and possibly an ostomy, possible need for other procedures, such as abscess drains in radiology, possible prolonged hospital stay, possible diarrhea from removal of part of the colon, possible constipation from narcotics, possible bowel, bladder or sexual dysfunction if having rectal surgery, prolonged fatigue/weakness or appetite loss, possible early recurrence of of disease, possible complications of their medical problems such as heart disease or arrhythmias or lung problems, death (less than 1%). Pt with a very small risk of mesh infection as well.  I believe the patient understands and wishes to proceed with the surgery.

## 2015-05-03 ENCOUNTER — Other Ambulatory Visit: Payer: Self-pay | Admitting: Family Medicine

## 2015-05-04 NOTE — Telephone Encounter (Signed)
Refilled. Keep appt in August.

## 2015-05-05 NOTE — Telephone Encounter (Signed)
Faxed

## 2015-05-11 ENCOUNTER — Encounter: Payer: Self-pay | Admitting: *Deleted

## 2015-05-25 ENCOUNTER — Other Ambulatory Visit: Payer: Self-pay

## 2015-06-10 ENCOUNTER — Encounter (HOSPITAL_COMMUNITY): Payer: Self-pay

## 2015-06-10 ENCOUNTER — Encounter (HOSPITAL_COMMUNITY)
Admission: RE | Admit: 2015-06-10 | Discharge: 2015-06-10 | Disposition: A | Discharge status: Home / Self Care | Payer: Medicare Other | Source: Ambulatory Visit | Attending: General Surgery | Admitting: General Surgery

## 2015-06-10 DIAGNOSIS — Z79899 Other long term (current) drug therapy: Secondary | ICD-10-CM

## 2015-06-10 DIAGNOSIS — Z01812 Encounter for preprocedural laboratory examination: Secondary | ICD-10-CM | POA: Insufficient documentation

## 2015-06-10 DIAGNOSIS — K435 Parastomal hernia without obstruction or  gangrene: Secondary | ICD-10-CM

## 2015-06-10 HISTORY — DX: Bronchitis, not specified as acute or chronic: J40

## 2015-06-10 HISTORY — DX: Unspecified cirrhosis of liver: K74.60

## 2015-06-10 HISTORY — DX: Unspecified osteoarthritis, unspecified site: M19.90

## 2015-06-10 LAB — CBC
HCT: 45.4 % (ref 36.0–46.0)
HEMOGLOBIN: 15.4 g/dL — AB (ref 12.0–15.0)
MCH: 32.8 pg (ref 26.0–34.0)
MCHC: 33.9 g/dL (ref 30.0–36.0)
MCV: 96.6 fL (ref 78.0–100.0)
PLATELETS: 215 10*3/uL (ref 150–400)
RBC: 4.7 MIL/uL (ref 3.87–5.11)
RDW: 14.3 % (ref 11.5–15.5)
WBC: 8.1 10*3/uL (ref 4.0–10.5)

## 2015-06-10 LAB — COMPREHENSIVE METABOLIC PANEL
ALK PHOS: 147 U/L — AB (ref 38–126)
ALT: 105 U/L — AB (ref 14–54)
ANION GAP: 10 (ref 5–15)
AST: 122 U/L — ABNORMAL HIGH (ref 15–41)
Albumin: 4.6 g/dL (ref 3.5–5.0)
BUN: 16 mg/dL (ref 6–20)
CO2: 23 mmol/L (ref 22–32)
Calcium: 9.7 mg/dL (ref 8.9–10.3)
Chloride: 102 mmol/L (ref 101–111)
Creatinine, Ser: 0.74 mg/dL (ref 0.44–1.00)
Glucose, Bld: 133 mg/dL — ABNORMAL HIGH (ref 65–99)
POTASSIUM: 4 mmol/L (ref 3.5–5.1)
Sodium: 135 mmol/L (ref 135–145)
TOTAL PROTEIN: 8.9 g/dL — AB (ref 6.5–8.1)
Total Bilirubin: 1.2 mg/dL (ref 0.3–1.2)

## 2015-06-10 NOTE — Patient Instructions (Signed)
Brianna Duncan  06/10/2015   Your procedure is scheduled on: Friday June 12, 2015  Report to [facility] Main  Entrance take [place]  elevators to 3rd floor to  [place] at 10:00 AM.  Call this number if you have problems the morning of surgery [phone]   Remember: ONLY 1 PERSON MAY GO WITH YOU TO SHORT STAY TO GET  READY MORNING OF [place].  Do not eat food or drink liquids :After Midnight.     Take these medicines the morning of surgery with A SIP OF WATER: Alprazolam (Xanax) if needed; Amlodipine (Norvasc); Flonase if needed; Omeprazole (Prilosec); Sertraline (Zoloft)                               You may not have any metal on your body including hair pins and              piercings  Do not wear jewelry, make-up, lotions, powders or perfumes, deodorant             Do not wear nail polish.  Do not shave  48 hours prior to surgery.              Do not bring valuables to the hospital. [place].  Contacts, dentures or bridgework may not be worn into surgery.  Leave suitcase in the car. After surgery it may be brought to your room.     Special Instructions: FOLLOW BOWEL PREPARATION INSTRUCTIONS PER MD                         [facility] - Preparing for Surgery Before surgery, you can play an important role.  Because skin is not sterile, your skin needs to be as free of germs as possible.  You can reduce the number of germs on your skin by washing with CHG (chlorahexidine gluconate) soap before surgery.  CHG is an antiseptic cleaner which kills germs and bonds with the skin to continue killing germs even after washing. Please DO NOT use if you have an allergy to CHG or antibacterial soaps.  If your skin becomes reddened/irritated stop using the CHG and inform your nurse when you arrive at Short Stay. Do not shave (including legs and underarms) for at least 48 hours prior to the first CHG shower.   You may shave your face/neck. Please follow these instructions carefully:  1.  Shower with CHG Soap the night before surgery and the  morning of Surgery.  2.  If you choose to wash your hair, wash your hair first as usual with your  normal  shampoo.  3.  After you shampoo, rinse your hair and body thoroughly to remove the  shampoo.                           4.  Use CHG as you would any other liquid soap.  You can apply chg directly  to the skin and wash                       Gently with a scrungie or clean washcloth.  5.  Apply the CHG Soap  to your body ONLY FROM THE NECK DOWN.   Do not use on face/ open                           Wound or open sores. Avoid contact with eyes, ears mouth and genitals (private parts).                       Wash face,  Genitals (private parts) with your normal soap.             6.  Wash thoroughly, paying special attention to the area where your surgery  will be performed.  7.  Thoroughly rinse your body with warm water from the neck down.  8.  DO NOT shower/wash with your normal soap after using and rinsing off  the CHG Soap.                9.  Pat yourself dry with a clean towel.            10.  Wear clean pajamas.            11.  Place clean sheets on your bed the night of your first shower and do not  sleep with pets. Day of Surgery : Do not apply any lotions/deodorants the morning of surgery.  Please wear clean clothes to the hospital/surgery center.  FAILURE TO FOLLOW THESE INSTRUCTIONS MAY RESULT IN THE CANCELLATION OF YOUR SURGERY PATIENT SIGNATURE_________________________________  NURSE SIGNATURE__________________________________  ________________________________________________________________________

## 2015-06-10 NOTE — Progress Notes (Addendum)
EKG per chart 06/25/2014 OV note per Dr Einar Gip on chart 06/25/2014  CXR epic 07/30/2014  OV note Dr Cato Mulligan MD 04/07/2015  ECHO epic 07/11/2014 OV note Dr Lamonte Sakai epic 07/29/2014  US carotid study epic 04/04/2014 OV note epic neurology / Dr Krista Blue 03/2014

## 2015-06-11 LAB — HEMOGLOBIN A1C
HEMOGLOBIN A1C: 5.8 % — AB (ref 4.8–5.6)
MEAN PLASMA GLUCOSE: 120 mg/dL

## 2015-06-11 NOTE — Progress Notes (Signed)
CBC, CMP and HGA1C results per epic per PAT visit 06/10/2015 sent to Dr Marcello Moores

## 2015-06-12 ENCOUNTER — Inpatient Hospital Stay (HOSPITAL_COMMUNITY): Payer: Medicare Other | Admitting: Anesthesiology

## 2015-06-12 ENCOUNTER — Inpatient Hospital Stay (HOSPITAL_COMMUNITY): Payer: Medicare Other

## 2015-06-12 ENCOUNTER — Encounter (HOSPITAL_COMMUNITY): Admission: RE | Disposition: E | Payer: Self-pay | Source: Ambulatory Visit | Attending: General Surgery

## 2015-06-12 ENCOUNTER — Inpatient Hospital Stay (HOSPITAL_COMMUNITY)
Admission: RE | Admit: 2015-06-12 | Discharge: 2015-06-29 | DRG: 393 | Disposition: E | Payer: Medicare Other | Source: Ambulatory Visit | Attending: General Surgery | Admitting: General Surgery

## 2015-06-12 ENCOUNTER — Encounter (HOSPITAL_COMMUNITY): Payer: Self-pay | Admitting: *Deleted

## 2015-06-12 DIAGNOSIS — I748 Embolism and thrombosis of other arteries: Secondary | ICD-10-CM | POA: Diagnosis not present

## 2015-06-12 DIAGNOSIS — Z515 Encounter for palliative care: Secondary | ICD-10-CM | POA: Diagnosis not present

## 2015-06-12 DIAGNOSIS — I248 Other forms of acute ischemic heart disease: Secondary | ICD-10-CM | POA: Diagnosis present

## 2015-06-12 DIAGNOSIS — E669 Obesity, unspecified: Secondary | ICD-10-CM | POA: Diagnosis present

## 2015-06-12 DIAGNOSIS — I469 Cardiac arrest, cause unspecified: Secondary | ICD-10-CM

## 2015-06-12 DIAGNOSIS — K529 Noninfective gastroenteritis and colitis, unspecified: Secondary | ICD-10-CM | POA: Diagnosis present

## 2015-06-12 DIAGNOSIS — Z7982 Long term (current) use of aspirin: Secondary | ICD-10-CM | POA: Diagnosis not present

## 2015-06-12 DIAGNOSIS — Z933 Colostomy status: Secondary | ICD-10-CM

## 2015-06-12 DIAGNOSIS — M1990 Unspecified osteoarthritis, unspecified site: Secondary | ICD-10-CM | POA: Diagnosis present

## 2015-06-12 DIAGNOSIS — J9690 Respiratory failure, unspecified, unspecified whether with hypoxia or hypercapnia: Secondary | ICD-10-CM

## 2015-06-12 DIAGNOSIS — I2699 Other pulmonary embolism without acute cor pulmonale: Secondary | ICD-10-CM | POA: Diagnosis not present

## 2015-06-12 DIAGNOSIS — Z87891 Personal history of nicotine dependence: Secondary | ICD-10-CM | POA: Diagnosis not present

## 2015-06-12 DIAGNOSIS — F419 Anxiety disorder, unspecified: Secondary | ICD-10-CM | POA: Diagnosis present

## 2015-06-12 DIAGNOSIS — K661 Hemoperitoneum: Secondary | ICD-10-CM | POA: Diagnosis present

## 2015-06-12 DIAGNOSIS — R001 Bradycardia, unspecified: Secondary | ICD-10-CM | POA: Diagnosis present

## 2015-06-12 DIAGNOSIS — K435 Parastomal hernia without obstruction or  gangrene: Secondary | ICD-10-CM | POA: Diagnosis present

## 2015-06-12 DIAGNOSIS — Q279 Congenital malformation of peripheral vascular system, unspecified: Secondary | ICD-10-CM

## 2015-06-12 DIAGNOSIS — T819XXA Unspecified complication of procedure, initial encounter: Secondary | ICD-10-CM

## 2015-06-12 DIAGNOSIS — Q278 Other specified congenital malformations of peripheral vascular system: Secondary | ICD-10-CM

## 2015-06-12 DIAGNOSIS — I959 Hypotension, unspecified: Secondary | ICD-10-CM | POA: Diagnosis not present

## 2015-06-12 DIAGNOSIS — I129 Hypertensive chronic kidney disease with stage 1 through stage 4 chronic kidney disease, or unspecified chronic kidney disease: Secondary | ICD-10-CM | POA: Diagnosis present

## 2015-06-12 DIAGNOSIS — R0902 Hypoxemia: Secondary | ICD-10-CM | POA: Diagnosis not present

## 2015-06-12 DIAGNOSIS — Z66 Do not resuscitate: Secondary | ICD-10-CM | POA: Diagnosis present

## 2015-06-12 DIAGNOSIS — H5500 Unspecified nystagmus: Secondary | ICD-10-CM | POA: Diagnosis not present

## 2015-06-12 DIAGNOSIS — I10 Essential (primary) hypertension: Secondary | ICD-10-CM | POA: Diagnosis not present

## 2015-06-12 DIAGNOSIS — G931 Anoxic brain damage, not elsewhere classified: Secondary | ICD-10-CM | POA: Diagnosis not present

## 2015-06-12 DIAGNOSIS — K117 Disturbances of salivary secretion: Secondary | ICD-10-CM | POA: Insufficient documentation

## 2015-06-12 DIAGNOSIS — G4089 Other seizures: Secondary | ICD-10-CM | POA: Diagnosis present

## 2015-06-12 DIAGNOSIS — R569 Unspecified convulsions: Secondary | ICD-10-CM | POA: Diagnosis not present

## 2015-06-12 DIAGNOSIS — J9601 Acute respiratory failure with hypoxia: Secondary | ICD-10-CM | POA: Insufficient documentation

## 2015-06-12 DIAGNOSIS — K7460 Unspecified cirrhosis of liver: Secondary | ICD-10-CM | POA: Diagnosis present

## 2015-06-12 DIAGNOSIS — R0600 Dyspnea, unspecified: Secondary | ICD-10-CM | POA: Insufficient documentation

## 2015-06-12 DIAGNOSIS — I97711 Intraoperative cardiac arrest during other surgery: Secondary | ICD-10-CM | POA: Diagnosis not present

## 2015-06-12 LAB — BLOOD GAS, ARTERIAL
ACID-BASE DEFICIT: 5.3 mmol/L — AB (ref 0.0–2.0)
Acid-base deficit: 4.7 mmol/L — ABNORMAL HIGH (ref 0.0–2.0)
Acid-base deficit: 6.2 mmol/L — ABNORMAL HIGH (ref 0.0–2.0)
BICARBONATE: 23 meq/L (ref 20.0–24.0)
BICARBONATE: 19.5 meq/L — AB (ref 20.0–24.0)
Bicarbonate: 23.7 mEq/L (ref 20.0–24.0)
Drawn by: 307971
FIO2: 1 %
FIO2: 1 %
FIO2: 1 %
LHR: 20 {breaths}/min
O2 SAT: 97 %
O2 Saturation: 60.3 %
O2 Saturation: 91.7 %
PATIENT TEMPERATURE: 98.6
PATIENT TEMPERATURE: 98.6
PCO2 ART: 37.4 mmHg (ref 35.0–45.0)
PEEP: 5 cmH2O
Patient temperature: 98.6
TCO2: 21.5 mmol/L (ref 0–100)
TCO2: 22.2 mmol/L (ref 0–100)
TCO2: 17.3 mmol/L (ref 0–100)
VT: 350 mL
pCO2 arterial: 60.1 mmHg (ref 35.0–45.0)
pCO2 arterial: 63.6 mmHg (ref 35.0–45.0)
pH, Arterial: 7.183 — CL (ref 7.350–7.450)
pH, Arterial: 7.22 — ABNORMAL LOW (ref 7.350–7.450)
pH, Arterial: 7.337 — ABNORMAL LOW (ref 7.350–7.450)
pO2, Arterial: 41.3 mmHg — ABNORMAL LOW (ref 80.0–100.0)
pO2, Arterial: 87.4 mmHg (ref 80.0–100.0)
pO2, Arterial: 111 mmHg — ABNORMAL HIGH (ref 80.0–100.0)

## 2015-06-12 LAB — CBC
HCT: 42.8 % (ref 36.0–46.0)
HCT: 38.7 % (ref 36.0–46.0)
HEMOGLOBIN: 12.6 g/dL (ref 12.0–15.0)
Hemoglobin: 13.8 g/dL (ref 12.0–15.0)
MCH: 32.2 pg (ref 26.0–34.0)
MCH: 32.8 pg (ref 26.0–34.0)
MCHC: 32.2 g/dL (ref 30.0–36.0)
MCHC: 32.6 g/dL (ref 30.0–36.0)
MCV: 100 fL (ref 78.0–100.0)
MCV: 100.8 fL — AB (ref 78.0–100.0)
PLATELETS: 316 10*3/uL (ref 150–400)
PLATELETS: 316 10*3/uL (ref 150–400)
RBC: 4.28 MIL/uL (ref 3.87–5.11)
RBC: 3.84 MIL/uL — ABNORMAL LOW (ref 3.87–5.11)
RDW: 14.4 % (ref 11.5–15.5)
RDW: 14.5 % (ref 11.5–15.5)
WBC: 24.4 10*3/uL — AB (ref 4.0–10.5)
WBC: 28.6 10*3/uL — ABNORMAL HIGH (ref 4.0–10.5)

## 2015-06-12 LAB — LACTIC ACID, PLASMA: Lactic Acid, Venous: 1.4 mmol/L (ref 0.5–2.0)

## 2015-06-12 LAB — CK TOTAL AND CKMB (NOT AT ARMC)
CK, MB: 5.2 ng/mL — ABNORMAL HIGH (ref 0.5–5.0)
Relative Index: INVALID (ref 0.0–2.5)
Total CK: 84 U/L (ref 38–234)

## 2015-06-12 LAB — PROTIME-INR
INR: 1.17 (ref 0.00–1.49)
INR: 1.09 (ref 0.00–1.49)
PROTHROMBIN TIME: 14.3 s (ref 11.6–15.2)
Prothrombin Time: 15.1 seconds (ref 11.6–15.2)

## 2015-06-12 LAB — D-DIMER, QUANTITATIVE (NOT AT ARMC): D-Dimer, Quant: 0.99 ug/mL-FEU — ABNORMAL HIGH (ref 0.00–0.48)

## 2015-06-12 LAB — TROPONIN I: Troponin I: 0.13 ng/mL — ABNORMAL HIGH (ref <0.031)

## 2015-06-12 LAB — APTT: APTT: 34 s (ref 24–37)

## 2015-06-12 LAB — TRIGLYCERIDES: Triglycerides: 131 mg/dL (ref <150)

## 2015-06-12 SURGERY — CLOSURE, COLOSTOMY, LAPAROSCOPIC
Anesthesia: General

## 2015-06-12 MED ORDER — BUPIVACAINE-EPINEPHRINE 0.25% -1:200000 IJ SOLN
INTRAMUSCULAR | Status: AC
  Filled 2015-06-12: qty 1

## 2015-06-12 MED ORDER — FENTANYL CITRATE (PF) 100 MCG/2ML IJ SOLN
50.0000 ug | INTRAMUSCULAR | Status: DC | PRN
  Administered 2015-06-12 – 2015-06-14 (×3): 50 ug via INTRAVENOUS
  Filled 2015-06-12: qty 2

## 2015-06-12 MED ORDER — MIDAZOLAM HCL 2 MG/2ML IJ SOLN
1.0000 mg | INTRAMUSCULAR | Status: DC | PRN
  Administered 2015-06-12 (×2): 1 mg via INTRAVENOUS
  Filled 2015-06-12 (×3): qty 2

## 2015-06-12 MED ORDER — SODIUM CHLORIDE 0.9 % IV BOLUS (SEPSIS)
1000.0000 mL | Freq: Once | INTRAVENOUS | Status: AC
  Administered 2015-06-12: 1000 mL via INTRAVENOUS

## 2015-06-12 MED ORDER — HYDROMORPHONE HCL 1 MG/ML IJ SOLN: 0.2500 mg | INTRAMUSCULAR | Status: DC | PRN

## 2015-06-12 MED ORDER — SODIUM CHLORIDE 0.9 % IV SOLN
25.0000 ug/h | INTRAVENOUS | Status: DC
  Administered 2015-06-12: 100 ug/h via INTRAVENOUS
  Administered 2015-06-13 (×2): 200 ug/h via INTRAVENOUS
  Administered 2015-06-14: 250 ug/h via INTRAVENOUS
  Administered 2015-06-14: 200 ug/h via INTRAVENOUS
  Administered 2015-06-15: 250 ug/h via INTRAVENOUS
  Filled 2015-06-12 (×6): qty 50

## 2015-06-12 MED ORDER — DEXTROSE 5 % IV SOLN
2.0000 g | INTRAVENOUS | Status: AC
  Administered 2015-06-12: 2 g via INTRAVENOUS
  Filled 2015-06-12: qty 2

## 2015-06-12 MED ORDER — PROPOFOL 1000 MG/100ML IV EMUL
0.0000 ug/kg/min | INTRAVENOUS | Status: DC
  Administered 2015-06-12: 5 ug/kg/min via INTRAVENOUS
  Administered 2015-06-13 – 2015-06-14 (×4): 40 ug/kg/min via INTRAVENOUS
  Administered 2015-06-14: 35 ug/kg/min via INTRAVENOUS
  Filled 2015-06-12 (×8): qty 100

## 2015-06-12 MED ORDER — LIDOCAINE HCL (PF) 2 % IJ SOLN
INTRAMUSCULAR | Status: DC | PRN
  Administered 2015-06-12: 75 mg via INTRADERMAL

## 2015-06-12 MED ORDER — ATROPINE SULFATE 1 MG/ML IJ SOLN
INTRAMUSCULAR | Status: DC | PRN
  Administered 2015-06-12: 1 mg via INTRAVENOUS

## 2015-06-12 MED ORDER — HEPARIN SODIUM (PORCINE) 5000 UNIT/ML IJ SOLN
5000.0000 [IU] | Freq: Three times a day (TID) | INTRAMUSCULAR | Status: DC
  Administered 2015-06-12: 5000 [IU] via SUBCUTANEOUS
  Filled 2015-06-12: qty 1

## 2015-06-12 MED ORDER — FENTANYL CITRATE (PF) 100 MCG/2ML IJ SOLN
INTRAMUSCULAR | Status: DC | PRN
  Administered 2015-06-12: 100 ug via INTRAVENOUS
  Administered 2015-06-12: 50 ug via INTRAVENOUS

## 2015-06-12 MED ORDER — LEVETIRACETAM 500 MG/5ML IV SOLN
500.0000 mg | Freq: Two times a day (BID) | INTRAVENOUS | Status: DC
  Administered 2015-06-13: 500 mg via INTRAVENOUS
  Filled 2015-06-12: qty 5

## 2015-06-12 MED ORDER — FENTANYL CITRATE (PF) 100 MCG/2ML IJ SOLN
50.0000 ug | INTRAMUSCULAR | Status: AC | PRN
  Administered 2015-06-12 (×3): 50 ug via INTRAVENOUS
  Filled 2015-06-12: qty 2

## 2015-06-12 MED ORDER — SODIUM CHLORIDE 0.9 % IV SOLN
1000.0000 mg | INTRAVENOUS | Status: AC
  Administered 2015-06-12: 1000 mg via INTRAVENOUS
  Filled 2015-06-12: qty 10

## 2015-06-12 MED ORDER — SUCCINYLCHOLINE CHLORIDE 20 MG/ML IJ SOLN
INTRAMUSCULAR | Status: DC | PRN
  Administered 2015-06-12: 100 mg via INTRAVENOUS

## 2015-06-12 MED ORDER — PROPOFOL 10 MG/ML IV BOLUS
INTRAVENOUS | Status: AC
  Filled 2015-06-12: qty 20

## 2015-06-12 MED ORDER — PHENYLEPHRINE HCL 10 MG/ML IJ SOLN
20.0000 mg | INTRAVENOUS | Status: DC | PRN
  Administered 2015-06-12: 25 ug/min via INTRAVENOUS

## 2015-06-12 MED ORDER — LACTATED RINGERS IV SOLN
INTRAVENOUS | Status: DC
  Administered 2015-06-12: 1000 mL via INTRAVENOUS

## 2015-06-12 MED ORDER — SODIUM CHLORIDE 0.9 % IV SOLN
500.0000 mg | Freq: Two times a day (BID) | INTRAVENOUS | Status: DC
  Filled 2015-06-12: qty 5

## 2015-06-12 MED ORDER — MIDAZOLAM HCL 2 MG/2ML IJ SOLN
2.0000 mg | Freq: Once | INTRAMUSCULAR | Status: AC
  Administered 2015-06-12: 2 mg via INTRAVENOUS

## 2015-06-12 MED ORDER — ROCURONIUM BROMIDE 100 MG/10ML IV SOLN
INTRAVENOUS | Status: DC | PRN
  Administered 2015-06-12: 40 mg via INTRAVENOUS
  Administered 2015-06-12 (×2): 20 mg via INTRAVENOUS

## 2015-06-12 MED ORDER — PANTOPRAZOLE SODIUM 40 MG IV SOLR
40.0000 mg | INTRAVENOUS | Status: DC
  Administered 2015-06-12 – 2015-06-13 (×2): 40 mg via INTRAVENOUS
  Filled 2015-06-12 (×3): qty 40

## 2015-06-12 MED ORDER — MIDAZOLAM HCL 2 MG/2ML IJ SOLN
INTRAMUSCULAR | Status: AC
  Filled 2015-06-12: qty 2

## 2015-06-12 MED ORDER — CEFOTETAN DISODIUM-DEXTROSE 2-2.08 GM-% IV SOLR
INTRAVENOUS | Status: AC
  Filled 2015-06-12: qty 50

## 2015-06-12 MED ORDER — ALVIMOPAN 12 MG PO CAPS
12.0000 mg | ORAL_CAPSULE | Freq: Once | ORAL | Status: AC
  Administered 2015-06-12: 12 mg via ORAL
  Filled 2015-06-12: qty 1

## 2015-06-12 MED ORDER — SODIUM CHLORIDE 0.9 % IV SOLN
500.0000 mg | INTRAVENOUS | Status: AC
  Administered 2015-06-12: 500 mg via INTRAVENOUS
  Filled 2015-06-12: qty 5

## 2015-06-12 MED ORDER — ROCURONIUM BROMIDE 100 MG/10ML IV SOLN
INTRAVENOUS | Status: AC
  Filled 2015-06-12: qty 1

## 2015-06-12 MED ORDER — FENTANYL CITRATE (PF) 100 MCG/2ML IJ SOLN
INTRAMUSCULAR | Status: AC
  Administered 2015-06-12: 100 ug
  Filled 2015-06-12: qty 2

## 2015-06-12 MED ORDER — PHENYLEPHRINE HCL 10 MG/ML IJ SOLN
INTRAMUSCULAR | Status: AC
  Filled 2015-06-12: qty 2

## 2015-06-12 MED ORDER — EPHEDRINE SULFATE 50 MG/ML IJ SOLN
INTRAMUSCULAR | Status: AC
  Filled 2015-06-12: qty 1

## 2015-06-12 MED ORDER — LACTATED RINGERS IV SOLN: INTRAVENOUS | Status: DC

## 2015-06-12 MED ORDER — LACTATED RINGERS IV SOLN
INTRAVENOUS | Status: DC | PRN
  Administered 2015-06-12: 13:00:00 via INTRAVENOUS

## 2015-06-12 MED ORDER — MIDAZOLAM HCL 2 MG/2ML IJ SOLN: 1.0000 mg | INTRAMUSCULAR | Status: DC | PRN

## 2015-06-12 MED ORDER — FENTANYL CITRATE (PF) 100 MCG/2ML IJ SOLN: 50.0000 ug | Freq: Once | INTRAMUSCULAR | Status: DC

## 2015-06-12 MED ORDER — PROPOFOL 10 MG/ML IV BOLUS
INTRAVENOUS | Status: DC | PRN
  Administered 2015-06-12: 120 mg via INTRAVENOUS

## 2015-06-12 MED ORDER — MIDAZOLAM HCL 2 MG/2ML IJ SOLN
2.0000 mg | INTRAMUSCULAR | Status: DC | PRN
  Administered 2015-06-13: 2 mg via INTRAVENOUS
  Filled 2015-06-12: qty 2

## 2015-06-12 MED ORDER — PHENYLEPHRINE HCL 10 MG/ML IJ SOLN
INTRAMUSCULAR | Status: DC | PRN
  Administered 2015-06-12: 80 ug via INTRAVENOUS

## 2015-06-12 MED ORDER — ONDANSETRON HCL 4 MG/2ML IJ SOLN: 4.0000 mg | Freq: Four times a day (QID) | INTRAMUSCULAR | Status: DC | PRN

## 2015-06-12 MED ORDER — HYDROMORPHONE HCL 1 MG/ML IJ SOLN: 0.5000 mg | INTRAMUSCULAR | Status: DC | PRN

## 2015-06-12 MED ORDER — MIDAZOLAM HCL 2 MG/2ML IJ SOLN
2.0000 mg | INTRAMUSCULAR | Status: DC | PRN
  Administered 2015-06-12: 2 mg via INTRAVENOUS
  Filled 2015-06-12 (×2): qty 2

## 2015-06-12 MED ORDER — SODIUM CHLORIDE 0.9 % IJ SOLN
INTRAMUSCULAR | Status: AC
  Filled 2015-06-12: qty 50

## 2015-06-12 MED ORDER — LIDOCAINE HCL (CARDIAC) 20 MG/ML IV SOLN
INTRAVENOUS | Status: AC
  Filled 2015-06-12: qty 5

## 2015-06-12 MED ORDER — EPINEPHRINE HCL 0.1 MG/ML IJ SOSY
PREFILLED_SYRINGE | INTRAMUSCULAR | Status: DC | PRN
  Administered 2015-06-12: 50 ug via INTRAVENOUS

## 2015-06-12 MED ORDER — ATROPINE SULFATE 0.1 MG/ML IJ SOLN
INTRAMUSCULAR | Status: AC
  Filled 2015-06-12: qty 20

## 2015-06-12 MED ORDER — FENTANYL BOLUS VIA INFUSION
25.0000 ug | INTRAVENOUS | Status: DC | PRN
  Filled 2015-06-12: qty 25

## 2015-06-12 MED ORDER — KCL IN DEXTROSE-NACL 20-5-0.45 MEQ/L-%-% IV SOLN
INTRAVENOUS | Status: DC
  Administered 2015-06-12 – 2015-06-15 (×3): via INTRAVENOUS
  Filled 2015-06-12 (×5): qty 1000

## 2015-06-12 MED ORDER — BUPIVACAINE-EPINEPHRINE 0.25% -1:200000 IJ SOLN
INTRAMUSCULAR | Status: DC | PRN
  Administered 2015-06-12: 1 mL

## 2015-06-12 MED ORDER — MIDAZOLAM HCL 2 MG/2ML IJ SOLN
INTRAMUSCULAR | Status: AC
  Administered 2015-06-12: 2 mg
  Filled 2015-06-12: qty 2

## 2015-06-12 MED ORDER — SODIUM CHLORIDE 0.9 % IJ SOLN
INTRAMUSCULAR | Status: AC
  Filled 2015-06-12: qty 10

## 2015-06-12 MED ORDER — FENTANYL CITRATE (PF) 250 MCG/5ML IJ SOLN
INTRAMUSCULAR | Status: AC
  Filled 2015-06-12: qty 5

## 2015-06-12 SURGICAL SUPPLY — 65 items
APPLIER CLIP 5 13 M/L LIGAMAX5 (MISCELLANEOUS) ×3
APPLIER CLIP ROT 10 11.4 M/L (STAPLE)
APR CLP MED LRG 11.4X10 (STAPLE)
APR CLP MED LRG 5 ANG JAW (MISCELLANEOUS) ×1
BLADE EXTENDED COATED 6.5IN (ELECTRODE) ×4 IMPLANT
BLADE HEX COATED 2.75 (ELECTRODE) ×4 IMPLANT
CABLE HIGH FREQUENCY MONO STRZ (ELECTRODE) ×4 IMPLANT
CELLS DAT CNTRL 66122 CELL SVR (MISCELLANEOUS) IMPLANT
CLIP APPLIE 5 13 M/L LIGAMAX5 (MISCELLANEOUS) ×2 IMPLANT
CLIP APPLIE ROT 10 11.4 M/L (STAPLE) IMPLANT
CLOSURE STERI-STRIP 1/4X4 (GAUZE/BANDAGES/DRESSINGS) ×3 IMPLANT
COVER SURGICAL LIGHT HANDLE (MISCELLANEOUS) ×4 IMPLANT
DECANTER SPIKE VIAL GLASS SM (MISCELLANEOUS) ×4 IMPLANT
DRAIN CHANNEL 19F RND (DRAIN) IMPLANT
DRAPE LAPAROSCOPIC ABDOMINAL (DRAPES) ×4 IMPLANT
DRAPE UTILITY XL STRL (DRAPES) ×8 IMPLANT
ELECT REM PT RETURN 9FT ADLT (ELECTROSURGICAL) ×3
ELECTRODE REM PT RTRN 9FT ADLT (ELECTROSURGICAL) ×2 IMPLANT
EVACUATOR SILICONE 100CC (DRAIN) IMPLANT
GAUZE SPONGE 4X4 12PLY STRL (GAUZE/BANDAGES/DRESSINGS) ×4 IMPLANT
GLOVE BIO SURGEON STRL SZ 6.5 (GLOVE) ×6 IMPLANT
GLOVE BIO SURGEONS STRL SZ 6.5 (GLOVE) ×2
GLOVE BIOGEL PI IND STRL 7.0 (GLOVE) ×4 IMPLANT
GLOVE BIOGEL PI INDICATOR 7.0 (GLOVE) ×4
GOWN STRL REUS W/TWL 2XL LVL3 (GOWN DISPOSABLE) ×8 IMPLANT
GOWN STRL REUS W/TWL XL LVL3 (GOWN DISPOSABLE) ×24 IMPLANT
LEGGING LITHOTOMY PAIR STRL (DRAPES) ×4 IMPLANT
LIGASURE IMPACT 36 18CM CVD LR (INSTRUMENTS) IMPLANT
NS IRRIG 1000ML POUR BTL (IV SOLUTION) ×4 IMPLANT
PACK COLON (CUSTOM PROCEDURE TRAY) ×4 IMPLANT
PENCIL BUTTON HOLSTER BLD 10FT (ELECTRODE) ×8 IMPLANT
PORT LAP GEL ALEXIS MED 5-9CM (MISCELLANEOUS) IMPLANT
RETRACTOR WND ALEXIS 18 MED (MISCELLANEOUS) IMPLANT
RTRCTR WOUND ALEXIS 18CM MED (MISCELLANEOUS)
SCISSORS LAP 5X35 DISP (ENDOMECHANICALS) ×4 IMPLANT
SEALER TISSUE G2 CVD JAW 35 (ENDOMECHANICALS) ×1 IMPLANT
SEALER TISSUE G2 CVD JAW 45CM (ENDOMECHANICALS) ×2
SEALER TISSUE G2 STRG ARTC 35C (ENDOMECHANICALS) ×4 IMPLANT
SET IRRIG TUBING LAPAROSCOPIC (IRRIGATION / IRRIGATOR) ×4 IMPLANT
SLEEVE XCEL OPT CAN 5 100 (ENDOMECHANICALS) ×4 IMPLANT
SOLUTION ANTI FOG 6CC (MISCELLANEOUS) ×4 IMPLANT
SPONGE LAP 18X18 X RAY DECT (DISPOSABLE) IMPLANT
STAPLER VISISTAT 35W (STAPLE) ×4 IMPLANT
SUCTION POOLE TIP (SUCTIONS) ×4 IMPLANT
SUT ETHILON 2 0 PS N (SUTURE) IMPLANT
SUT PDS AB 1 CTX 36 (SUTURE) IMPLANT
SUT PDS AB 1 TP1 96 (SUTURE) IMPLANT
SUT PROLENE 2 0 KS (SUTURE) IMPLANT
SUT SILK 2 0 (SUTURE) ×3
SUT SILK 2 0 SH CR/8 (SUTURE) ×4 IMPLANT
SUT SILK 2-0 18XBRD TIE 12 (SUTURE) ×2 IMPLANT
SUT SILK 3 0 (SUTURE) ×3
SUT SILK 3 0 SH CR/8 (SUTURE) ×4 IMPLANT
SUT SILK 3-0 18XBRD TIE 12 (SUTURE) ×2 IMPLANT
SUT VIC AB 2-0 SH 18 (SUTURE) IMPLANT
SUT VICRYL 2 0 18  UND BR (SUTURE) ×2
SUT VICRYL 2 0 18 UND BR (SUTURE) ×2 IMPLANT
SYS LAPSCP GELPORT 120MM (MISCELLANEOUS)
SYSTEM LAPSCP GELPORT 120MM (MISCELLANEOUS) IMPLANT
TOWEL OR 17X26 10 PK STRL BLUE (TOWEL DISPOSABLE) IMPLANT
TOWEL OR NON WOVEN STRL DISP B (DISPOSABLE) ×8 IMPLANT
TRAY FOLEY W/METER SILVER 14FR (SET/KITS/TRAYS/PACK) ×4 IMPLANT
TROCAR BLADELESS OPT 5 100 (ENDOMECHANICALS) ×4 IMPLANT
TROCAR XCEL BLUNT TIP 100MML (ENDOMECHANICALS) ×4 IMPLANT
TUBING FILTER THERMOFLATOR (ELECTROSURGICAL) ×4 IMPLANT

## 2015-06-12 NOTE — H&P (Signed)
HPI  This is a 69 year old female who is status post emergent Hartman's procedure in September 2015. She had a prolonged postoperative course due to severe deconditioning prior to surgery. She was discharged approximately 2 weeks after surgery. 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 She was also noted to have an enlarged liver and biopsies confirm liver cirrhosis. She saw Dr. Ardis Hughs in the office for evaluation of her cirrhosis. He also performed a colonoscopy in April, which showed 3 small adenomatous polyps. She will need another colonoscopy in 3 years. 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 She has significantly reduced her alcohol consumption to approximately 2 glasses of wine per week. He will follow her on a biannual basis. Currently she is having a lot of difficulty with pouching. She has a parastomal hernia edges making it difficult for her to keep the pouch in place. She denies any obstructive symptoms or bleeding.   PMH  Kidney Stone High blood pressure Other disease, cancer, significant illness Colitis/Colon stricture Anxiety Disorder Congestive Heart Failure Arthritis  Past Surgical History   Oral Surgery Hysterectomy (not due to cancer) - Complete Oophorectomy Bilateral. Foot Surgery Bilateral.  Diagnostic Studies  Mammogram within last year Colonoscopy within last year  Allergies  Iodinated Contrast Media  Medications  Xanax (0.5MG  Tablet, Oral) Active. Lisinopril-Hydrochlorothiazide (20-12.5MG  Tablet, Oral) Active. Aspirin (81MG  Tablet, 1 (one) Oral) Active. Vivelle-Dot (0.05MG /24HR Patch TW, Transdermal) Active. Norvasc (10MG  Tablet, Oral) Active. Omeprazole (20MG  Capsule DR, Oral) Active. Sertraline HCl (100MG  Tablet, Oral) Active. Probiotic (Oral) Active. Multivitamins (Oral) Active.  Social History   Caffeine use Coffee. Alcohol use Moderate alcohol use. Tobacco use Former smoker. No drug use  Family History   Arthritis Mother. Cerebrovascular Accident Father, Sister. Bleeding disorder Mother, Sister. Diabetes Mellitus Brother, Father. Respiratory Condition Father. Heart disease in female family member before age 51 Heart Disease Brother. Kidney Disease Brother, Sister. Hypertension Brother, Father, Mother, Sister.  Pregnancy / Birth History   Age at menarche 40 years. Para 1 Gravida 1 Age of menopause 40-55 Maternal age 28-20 Irregular periods  Review of Systems   General Present- Fatigue, Night Sweats and Weight Loss. Not Present- Appetite Loss, Chills, Fever and Weight Gain. Skin Present- New Lesions. Not Present- Change in Wart/Mole, Dryness, Hives, Jaundice, Non-Healing Wounds, Rash and Ulcer. HEENT Present- Wears glasses/contact lenses. Not Present- Earache, Hearing Loss, Hoarseness, Nose Bleed, Oral Ulcers, Ringing in the Ears, Seasonal Allergies, Sinus Pain, Sore Throat, Visual Disturbances and Yellow Eyes. Respiratory Not Present- Bloody sputum, Chronic Cough, Difficulty Breathing, Snoring and Wheezing. Breast Not Present- Breast Mass, Breast Pain, Nipple Discharge and Skin Changes. Cardiovascular Present- Swelling of Extremities. Not Present- Chest Pain, Difficulty Breathing Lying Down, Leg Cramps, Palpitations, Rapid Heart Rate and Shortness of Breath. Gastrointestinal Present- Abdominal Pain, Change in Bowel Habits and Nausea. Not Present- Bloating, Bloody Stool, Chronic diarrhea, Constipation, Difficulty Swallowing, Excessive gas, Gets full quickly at meals, Hemorrhoids, Indigestion, Rectal Pain and Vomiting. Female Genitourinary Present- Nocturia. Not Present- Frequency, Painful Urination, Pelvic Pain and Urgency. Musculoskeletal Present- Swelling of Extremities. Not Present- Back Pain, Joint Pain, Joint Stiffness, Muscle Pain and Muscle  Weakness. Neurological Present- Decreased Memory and Weakness. Not Present- Fainting, Headaches, Numbness, Seizures, Tingling, Tremor and Trouble walking. Psychiatric Present- Anxiety. Not Present- Bipolar, Change in Sleep Pattern, Depression, Fearful and Frequent crying. Endocrine Not Present- Cold Intolerance, Excessive Hunger, Hair Changes, Heat Intolerance, Hot  flashes and New Diabetes. Hematology Present- Easy Bruising. Not Present- Excessive bleeding, Gland problems, HIV and Persistent Infections.  Physical Exam  BP 168/81 mmHg  Pulse 100  Temp(Src) 98 F (36.7 C) (Oral)  Resp 20  Ht 4\' 11"  (1.499 m)  Wt 75.921 kg (167 lb 6 oz)  BMI 33.79 kg/m2  SpO2 94%   General Mental Status-Alert. General Appearance-Consistent with stated age. Hydration-Well hydrated. Voice-Normal.  Head and Neck Head-normocephalic, atraumatic with no lesions or palpable masses. Trachea-midline. Thyroid Gland Characteristics - normal size and consistency.  Eye Eyeball - Bilateral-Extraocular movements intact. Sclera/Conjunctiva - Bilateral-No scleral icterus.  Chest and Lung Exam Chest and lung exam reveals -quiet, even and easy respiratory effort with no use of accessory muscles and on auscultation, normal breath sounds, no adventitious sounds and normal vocal resonance. Inspection Chest Wall - Normal. Back - normal.  Cardiovascular Cardiovascular examination reveals -normal heart sounds, regular rate and rhythm with no murmurs and normal pedal pulses bilaterally.  Abdomen Inspection Inspection of the abdomen reveals - Note: LLQ ostomy with noted parastomal hernia. Palpation/Percussion Palpation and Percussion of the abdomen reveal - Soft, Non Tender, No Rebound tenderness, No Rigidity (guarding) and No hepatosplenomegaly. Auscultation Auscultation of the abdomen reveals - Bowel sounds normal.  Neurologic Neurologic evaluation reveals -alert and oriented x 3 with no  impairment of recent or remote memory. Mental Status-Normal.  Musculoskeletal Global Assessment -Note:no gross deformities.  Normal Exam - Left-Upper Extremity Strength Normal and Lower Extremity Strength Normal. Normal Exam - Right-Upper Extremity Strength Normal and Lower Extremity Strength Normal.    Assessment & Plan    COLOSTOMY IN PLACE (V44.3  Z93.3) Impression: Pt is now medically ready for colostomy reversal. Dr Donne Hazel has asked me to perform this reversal. We discussed this in detail today. We will attempt to perform laparoscopically and close parastomal hernia primarily. I discussed this with Dr. Donne Hazel and he would like to perform a underlay mesh repair with physio-mesh. We will try to get this scheduled as a joint procedure. The surgery and anatomy were described to the patient as well as the risks of surgery and the possible complications. These include: Bleeding, deep abdominal infections and possible wound complications such as hernia and infection, damage to adjacent structures, leak of surgical connections, which can lead to other surgeries and possibly an ostomy, possible need for other procedures, such as abscess drains in radiology, possible prolonged hospital stay, possible diarrhea from removal of part of the colon, possible constipation from narcotics, possible bowel, bladder or sexual dysfunction if having rectal surgery, prolonged fatigue/weakness or appetite loss, possible early recurrence of of disease, possible complications of their medical problems such as heart disease or arrhythmias or lung problems, death (less than 1%). I have explained to her that we will use a semi-biologic mesh that should help prevent a recurrent hernia.  She understands that there is a small risk of mesh infection and an even smaller risk of mesh infection needing resection. I believe the patient understands and wishes to proceed with the surgery.

## 2015-06-12 NOTE — Progress Notes (Signed)
[facility] Progress Note Patient Name: Brianna Duncan DOB: [birth year] MRN: [id number]   Date of Service  06/19/2015  HPI/Events of Note  Hemoperitoneum. Surgery aware.   eICU Interventions  Will order: 1. CBC Q 6 hours. 2. D/C Heparin [place]. 3. Place SCD's.     Intervention Category Intermediate Interventions: Abdominal pain - evaluation and management  Vela Render Eugene 06/01/2015, 5:36 PM

## 2015-06-12 NOTE — Progress Notes (Signed)
Pt states she took pre-op antibiotics as instructed by office.

## 2015-06-12 NOTE — Progress Notes (Signed)
Noted tonic- clonic seizure activity at approx 1810.  Pt had 4-5 consecutive seizure episodes each lasting between 4-8 minutes.  This RN received several orders by Dr. Oletta Darter during this time including Versed and Keppra (see MD note).  Much support provided to daughter Lelon Frohlich.  Continued close monitoring.  Marnee Guarneri, RN

## 2015-06-12 NOTE — Op Note (Signed)
06/22/2015  2:10 PM  PATIENT:  Brianna Duncan  69 y.o. female  Patient Care Team: Wendie Agreste, MD as PCP - General (Family Medicine)  PRE-OPERATIVE DIAGNOSIS:  parastomal hernia  POST-OPERATIVE DIAGNOSIS:  PARASTOMAL HERNIA  PROCEDURE:   ABORTED LAPAROSCOPIC COLOSTOMY TAKEDOWN   Surgeon(s): Leighton Ruff, MD Rolm Bookbinder, MD   ANESTHESIA:   general  EBL:  Total I/O In: 3254 [I.V.:1300; IV Piggyback:50] Out: 200 [Urine:200]  DRAINS: none   SPECIMEN:  No Specimen  DISPOSITION OF SPECIMEN:  N/A  COUNTS:  NO Emergent, aborted procedure  PLAN OF CARE: Admit to inpatient   PATIENT DISPOSITION:  ICU - intubated and hemodynamically stable.  INDICATION: This is a 69 year old female who is status post Hartman's procedure for a sigmoid stricture by Dr. Donne Hazel.  She developed a parastomal hernia and had difficulty with pouching her ostomy. It was decided that she would benefit from a colostomy reversal. She was evaluated by cardiology and felt to be in stable condition for surgery.    DESCRIPTION: the patient was identified in the preoperative holding area and taken to the OR where they were laid supine on the operating room table and placed in lithotomy position.  General anesthesia was induced without difficulty. SCDs were also noted to be in place prior to the initiation of anesthesia.  The ostomy was closed with a 2-0 silk suture. A Foley catheter was inserted under sterile conditions. The patient was then prepped and draped in the usual sterile fashion.   A surgical timeout was performed indicating the correct patient, procedure, positioning and need for preoperative antibiotics.   I began by making a small incision in the patient's right upper quadrant and placing the patient in reverse Trendelenburg with left side down. I used an Optiview trocar to gain entry into the abdomen. Once inside the abdomen, this was used to insufflate.  I was not able to get the  abdomen to insufflate and placed a camera to evaluate. I saw omentum upon entry into the abdomen, which was swept out of the way bluntly. I then saw into the abdominal cavity, which appeared normal. At this time, the patient became bradycardic and the abdomen was desufflated. The patient was given atropine. She became hypoxic and hypocarbic.  During this time, she was also hypotensive. She was given an epinephrine bolus. We checked tube placement and confirmed good breath sounds bilaterally. There was no signs of intra-abdominal bleeding through the laparoscopic port.  At this point, I decided to remove the laparoscopic port. A Steri-Strip was placed over this and the drapes were removed. The suture was removed from the ostomy and an appliance was placed.  Over the next few minutes the anesthesia team worked to get her heart rate and blood pressure back to normal parameters. She sustained low oxygen saturations for approximately 8-10 minutes. After this time, her blood pressure came up and her oxygen saturations improved as well. She was transferred to the ICU in stable but critical condition. Cardiology and pulmonary consults were obtained. CT scans of the chest, abdomen and pelvis were ordered.

## 2015-06-12 NOTE — Anesthesia Postprocedure Evaluation (Signed)
  Anesthesia Post-op Note  Patient: Keith Rake Fogal  Procedure(s) Performed: Procedure(s) (LRB): ATTEMPTED LAPAROSCOPIC COLOSTOMY TAKEDOWN (N/A) - aborted  Patient Location: PACU  Anesthesia Type: ICU  Level of Consciousness: not conscious   Airway and Oxygen Therapy: On ventilator  Post-op Pain: mild  Post-op Assessment: Post-op Vital signs reviewed, Patient's Cardiovascular Status Stable, Respiratory Function Stable, Intubated on ventilator and No signs of Nausea or vomiting  Last Vitals:  Filed Vitals:   06/15/2015 1718  BP: 145/74  Pulse: 96  Temp:   Resp: 24    Post-op Vital Signs: stable   Complications: Pulmonary embolus during insufflation ( probably gas embolism )

## 2015-06-12 NOTE — Anesthesia Procedure Notes (Signed)
Procedure Name: Intubation Date/Time: 06/09/2015 12:19 PM Performed by: Lollie Sails Pre-anesthesia Checklist: Patient identified, Emergency Drugs available, Suction available, Patient being monitored and Timeout performed Patient Re-evaluated:Patient Re-evaluated prior to inductionOxygen Delivery Method: Circle system utilized Preoxygenation: Pre-oxygenation with 100% oxygen Intubation Type: IV induction, Rapid sequence and Cricoid Pressure applied Ventilation: Mask ventilation without difficulty Laryngoscope Size: Miller and 2 Grade View: Grade I Tube type: Oral Tube size: 7.5 mm Number of attempts: 1 Airway Equipment and Method: Stylet Placement Confirmation: ETT inserted through vocal cords under direct vision,  positive ETCO2 and breath sounds checked- equal and bilateral Secured at: 21 cm Tube secured with: Tape Dental Injury: Teeth and Oropharynx as per pre-operative assessment

## 2015-06-12 NOTE — Progress Notes (Signed)
[facility] Progress Note Patient Name: Brianna Duncan DOB: [birth year] MRN: [id number]   Date of Service  06/15/2015  HPI/Events of Note  Seizure. Broke with Versed 2 mg IV. 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   eICU Interventions  Will order: 1. Versed 2 mg IV Q 2 hours PRN. 2. Keppra 500 mg IV now and Q 12 hours.     Intervention Category Major Interventions: Seizures - evaluation and management  Sommer,Steven Eugene 06/18/2015, 6:25 PM

## 2015-06-12 NOTE — Consult Note (Addendum)
NEURO HOSPITALIST CONSULT NOTE   Referring physician: Dr Oletta Darter Reason for Consult: seizues  HPI:                                                                                                                                          Brianna Duncan is an 69 y.o. female with a past medical history that is relevant for HTN, chronic congestive heart failure, CKD, depression, s/p total colectomy in 2015, who developed brady cardiac arrest during in the middle of surgery for colostomy reversal today and subsequently have been experiencing recurrent GTC seizures.  Patient is currently intubated on the vent, thus all clinical data was obtained from patient medical record " the patient became bradycardic and the abdomen was desufflated. The patient was given atropine. She became hypoxic and hypocarbic. During this time, she was also hypotensive. She was given an epinephrine bolus. We checked tube placement and confirmed good breath sounds bilaterally. There was no signs of intra-abdominal bleeding through the laparoscopic port. At this point, I decided to remove the laparoscopic port. A Steri-Strip was placed over this and the drapes were removed. The suture was removed from the ostomy and an appliance was placed. Over the next few minutes the anesthesia team worked to get her heart rate and blood pressure back to normal parameters. She sustained low oxygen saturations for approximately 8-10 minutes. After this time, her blood pressure came up and her oxygen saturations improved as well. She was transferred to the ICU in stable but critical condition". As per RN " noted tonic- clonic seizure activity at approx 1810. Pt had 4-5 consecutive seizure episodes each lasting between 4-8 minutes". patient received total 4 mg IV Versed and Keppra 500 mg IV loading dose.Marland Kitchen Neurology consulted due to repetitive seizures and nystagmus raising concern for subclinical seizures.  Brianna Duncan  sustained a GTC seizure during my assessment.  Past Medical History  Diagnosis Date  . Hypertension   . Allergy     seasonal  . Anxiety   . Osteoporosis     Was on   . History of kidney stones   . On hormone replacement therapy     Patch  . Depression   . H. pylori infection     02-09-2015 egd   . Colostomy in place   . Acute colitis     [facility]-Dr Donne Hazel   . CHF (congestive heart failure)     resolved with no medicines currently   . GERD (gastroesophageal reflux disease)   . 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 Bronchitis     hx of   . Chronic kidney disease     kidney stones; pt states currently has 2 stones  . Arthritis   .  Cirrhosis of liver     Past Surgical History  Procedure Laterality Date  . Abdominal hysterectomy  2006  . Foot surgery  2010    right foot, left great toe and left second toe surgery  . Colonoscopy  06/2014  . Liver biopsy      probable cirrhosis per report  . Laparotomy N/A 08/14/2014    Procedure: EXPLORATORY LAPAROTOMY;  Surgeon: Rolm Bookbinder, MD;  Location: [place] [facility];  Service: General;  Laterality: N/A;  . Colectomy  08/14/2014    Procedure: TOTAL COLECTOMY/COLOSTOMY;  Surgeon: Rolm Bookbinder, MD;  Location: [facility] OR;  Service: General;;  . Ganglion cyst excision Left     Family History  Problem Relation Age of Onset  . Heart disease Mother   . Stroke Father   . Stroke Sister   . Heart disease Brother   . Colon cancer Neg Hx   . Rectal cancer Neg Hx   . Stomach cancer Neg Hx   . Esophageal cancer Neg Hx     Family History: unable to obtain due to mental status   Social History:  reports that she quit smoking about 15 years ago. Her smoking use included Cigarettes. She has a 33 pack-year smoking history. She has never used smokeless tobacco. She reports that she drinks about 0.6 oz of alcohol per week. She reports that she does not use illicit drugs.  Allergies  Allergen Reactions  . Ivp Dye [Iodinated  Diagnostic Agents] Hives  . Morphine And Related Other (See Comments)    Hallucinations    MEDICATIONS:                                                                                                                     Scheduled: . fentaNYL (SUBLIMAZE) injection  50 mcg Intravenous Once  . levETIRAcetam  500 mg Intravenous Q12H  . midazolam      . pantoprazole (PROTONIX) IV  40 mg Intravenous Q24H     ROS:                                                                                                                                       History obtained from chart review   Physical exam: critically ill female, intubated on the vent. Blood pressure 127/78, pulse 98, temperature 98.5 F (36.9 C), temperature source Axillary, resp. rate 24, height 4\' 11"  (1.499 m), weight 75.921 kg (167  lb 6 oz), SpO2 95 %. Head: normocephalic. Neck: supple, no bruits, no JVD. Cardiac: no murmurs. Lungs: clear. Abdomen: distended Extremities: no edema. Skin: no rash  Neurologic Examination:                                                                                                      Mental status: unresponsive, intubated on the vent. CN 2-12: pupils 4 mm, poorly reactive. No gaze preference. Nystagmus mainly on lateral gaze. Corneal reflex present in the right but couldn't elicit in the left. Motor: repetitively isolated ntermittent right arm jerkiness. Sensory: doesn't react to pain. DTR's: 1+ all over Plantars: no tested. Coordination and gait: no tested.  No results found for: CHOL  Results for orders placed or performed during the hospital encounter of 06/15/2015 (from the past 48 hour(s))  Blood gas, arterial     Status: Abnormal   Collection Time: 06/08/2015 12:50 PM  Result Value Ref Range   FIO2 1.00 %   Delivery systems MANUAL VENTILATION    pH, Arterial 7.220 (L) 7.350 - 7.450    Comment: CRITICAL RESULT CALLED TO, READ BACK BY AND VERIFIED WITH: H.COBLE RN AT 1300 BY LISA  CRADDOCK RRT,RCP ON 06/06/2015    pCO2 arterial 60.1 (HH) 35.0 - 45.0 mmHg    Comment: CRITICAL RESULT CALLED TO, READ BACK BY AND VERIFIED WITH: H.COBLE RN AT 1300 BY LISA CRADDOCK RRT,RCP ON 06/23/2015    pO2, Arterial 41.3 (L) 80.0 - 100.0 mmHg   Bicarbonate 23.7 20.0 - 24.0 mEq/L   TCO2 22.2 0 - 100 mmol/L   Acid-base deficit 4.7 (H) 0.0 - 2.0 mmol/L   O2 Saturation 60.3 %   Patient temperature 98.6    Collection site A-LINE    Drawn by COLLECTED BY LABORATORY    Sample type ARTERIAL DRAW    Allens test (pass/fail) PASS PASS  CK total and CKMB (cardiac)not at [facility]     Status: Abnormal   Collection Time: 06/14/2015  1:11 PM  Result Value Ref Range   Total CK 84 38 - 234 U/L   CK, MB 5.2 (H) 0.5 - 5.0 ng/mL   Relative Index RELATIVE INDEX IS INVALID 0.0 - 2.5    Comment: WHEN CK < 100 U/L        Performed at [facility]   APTT     Status: None   Collection Time: 06/01/2015  1:11 PM  Result Value Ref Range   aPTT 34 24 - 37 seconds  Protime-INR     Status: None   Collection Time: 06/17/2015  1:11 PM  Result Value Ref Range   Prothrombin Time 15.1 11.6 - 15.2 seconds   INR 1.17 0.00 - 1.49  D-dimer, quantitative (not at [facility])     Status: Abnormal   Collection Time: 05/29/2015  1:11 PM  Result Value Ref Range   D-Dimer, Quant 0.99 (H) 0.00 - 0.48 ug/mL-FEU    Comment:        AT THE INHOUSE ESTABLISHED CUTOFF VALUE OF 0.48 ug/mL FEU, THIS ASSAY HAS BEEN DOCUMENTED IN  THE LITERATURE TO HAVE A SENSITIVITY AND NEGATIVE PREDICTIVE VALUE OF AT LEAST 98 TO 99%.  THE TEST RESULT SHOULD BE CORRELATED WITH AN ASSESSMENT OF THE CLINICAL PROBABILITY OF DVT / VTE.   Lactic acid, plasma     Status: None   Collection Time: 06/02/2015  1:13 PM  Result Value Ref Range   Lactic Acid, Venous 1.4 0.5 - 2.0 mmol/L  Blood gas, arterial     Status: Abnormal   Collection Time: 06/17/2015  1:15 PM  Result Value Ref Range   FIO2 1.00 %   Delivery systems MANUAL VENTILATION    pH, Arterial  7.183 (LL) 7.350 - 7.450    Comment: CRITICAL RESULT CALLED TO, READ BACK BY AND VERIFIED WITH: S.COUDLE,RN AT 1325 BY SHERRY REEKS,RRT,RCP ON 06/15/2015    pCO2 arterial 63.6 (HH) 35.0 - 45.0 mmHg    Comment: CRITICAL RESULT CALLED TO, READ BACK BY AND VERIFIED WITH: S.COUDLE,RN AT 1325 BY SHERRY REEKS,RRT,RCP ON 06/17/2015    pO2, Arterial 87.4 80.0 - 100.0 mmHg   Bicarbonate 23.0 20.0 - 24.0 mEq/L   TCO2 21.5 0 - 100 mmol/L   Acid-base deficit 6.2 (H) 0.0 - 2.0 mmol/L   O2 Saturation 91.7 %   Patient temperature 98.6    Collection site A-LINE    Drawn by COLLECTED BY LABORATORY    Sample type ARTERIAL DRAW    Allens test (pass/fail) PASS PASS  Blood gas, arterial     Status: Abnormal (Preliminary result)   Collection Time: 06/10/2015  2:16 PM  Result Value Ref Range   FIO2 1.00 %   O2 Content PENDING L/min   Delivery systems VENTILATOR    Mode PRESSURE REGULATED VOLUME CONTROL    VT 350 mL   Rate 16 resp/min   Peep/cpap 5.0 cm H20   pH, Arterial 7.206 (L) 7.350 - 7.450   pCO2 arterial 59.0 (HH) 35.0 - 45.0 mmHg    Comment: CRITICAL RESULT CALLED TO, READ BACK BY AND VERIFIED WITH: STEVE MINOR NP, AT 1419 BY SHERRY REEKES, RRT,RCP ON 06/05/2015    pO2, Arterial 158 (H) 80.0 - 100.0 mmHg   Bicarbonate 22.5 20.0 - 24.0 mEq/L   TCO2 20.9 0 - 100 mmol/L   Acid-base deficit 6.0 (H) 0.0 - 2.0 mmol/L   O2 Saturation 97.7 %   Patient temperature 98.6    Collection site A-LINE    Drawn by [id number]    Sample type ARTERIAL DRAW   Blood gas, arterial     Status: Abnormal   Collection Time: 06/17/2015  4:39 PM  Result Value Ref Range   FIO2 1.00 %   Delivery systems VENTILATOR    Mode PRESSURE REGULATED VOLUME CONTROL    VT 350 mL   Rate 20 resp/min   Peep/cpap 5.0 cm H20   pH, Arterial 7.337 (L) 7.350 - 7.450   pCO2 arterial 37.4 35.0 - 45.0 mmHg   pO2, Arterial 111 (H) 80.0 - 100.0 mmHg   Bicarbonate 19.5 (L) 20.0 - 24.0 mEq/L   TCO2 17.3 0 - 100 mmol/L   Acid-base deficit 5.3 (H)  0.0 - 2.0 mmol/L   O2 Saturation 97.0 %   Patient temperature 98.6    Collection site A-LINE    Drawn by [id number]    Sample type ARTERIAL DRAW   CBC     Status: Abnormal   Collection Time: 06/15/2015  6:12 PM  Result Value Ref Range   WBC 24.4 (H) 4.0 - 10.5 K/uL   RBC  4.28 3.87 - 5.11 MIL/uL   Hemoglobin 13.8 12.0 - 15.0 g/dL   HCT 42.8 36.0 - 46.0 %   MCV 100.0 78.0 - 100.0 fL   MCH 32.2 26.0 - 34.0 pg   MCHC 32.2 30.0 - 36.0 g/dL   RDW 14.4 11.5 - 15.5 %   Platelets 316 150 - 400 K/uL  Protime-INR     Status: None   Collection Time: 06/21/2015  6:12 PM  Result Value Ref Range   Prothrombin Time 14.3 11.6 - 15.2 seconds   INR 1.09 0.00 - 1.49  Troponin I     Status: Abnormal   Collection Time: 06/06/2015  6:39 PM  Result Value Ref Range   Troponin I 0.13 (H) <0.031 ng/mL    Comment:        PERSISTENTLY INCREASED TROPONIN VALUES IN THE RANGE OF 0.04-0.49 ng/mL CAN BE SEEN IN:       -UNSTABLE ANGINA       -CONGESTIVE HEART FAILURE       -MYOCARDITIS       -CHEST TRAUMA       -ARRYHTHMIAS       -LATE PRESENTING MYOCARDIAL INFARCTION       -COPD   CLINICAL FOLLOW-UP RECOMMENDED.     Ct Abdomen Pelvis Wo Contrast  06/17/2015   CLINICAL DATA:  Patient brought to the operating room for colostomy takedown. During instillation common patient developed bradycardia and hypoxia.  EXAM: CT CHEST, ABDOMEN AND PELVIS WITHOUT CONTRAST  TECHNIQUE: Multidetector CT imaging of the chest, abdomen and pelvis was performed following the standard protocol without IV contrast.  COMPARISON:  CT abdomen pelvis 08/10/2014.  FINDINGS: CT CHEST FINDINGS  Mediastinum/Nodes: ET tube terminates within the distal trachea. Enteric tube terminates within the stomach. No enlarged axillary, mediastinal or hilar lymphadenopathy. Normal heart size. Coronary arterial vascular calcifications.  Lungs/Pleura: Central airways are patent. Dependent atelectasis within the bilateral lower lobes. Small bilateral pleural  effusions. No definite pneumothorax.  Musculoskeletal: No aggressive or acute appearing osseous lesions.  CT ABDOMEN AND PELVIS FINDINGS  Hepatobiliary: Liver is diffusely low in attenuation compatible with hepatic steatosis. The liver is nodular in contour suggestive of cirrhosis. Small gallstone within the gallbladder lumen. Abnormal appearance to the left hepatic lobe adjacent to the falciform ligament likely corresponds with abnormal profusion of this location demonstrated prior contrast-enhanced CT.  Pancreas: Unremarkable  Spleen: Unremarkable  Adrenals/Urinary Tract: Normal adrenal glands. Stable bilateral low-attenuation renal lesions. Unchanged 7 mm nonobstructing stone inferior pole left kidney. Urinary bladder decompressed with Foley catheter.  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 No abnormal bowel wall thickening or evidence for bowel obstruction.  Vascular/Lymphatic: Normal caliber abdominal aorta. No retroperitoneal adenopathy.  Other: High attenuation blood products are demonstrated coursing around the liver, along the right paracolic gutter and into the pelvis. Moderate volume hemoperitoneum. There are multiple foci of free intraperitoneal air. Additionally there is a fat containing ventral abdominal wall hernia. Diastases of the rectus abdominus musculature.  Musculoskeletal: No aggressive or acute appearing osseous lesions. Lower lumbar spine degenerative changes.  IMPRESSION: Moderate volume hemoperitoneum coursing from the perihepatic location along the right paracolic gutter and into the pelvis. Definitive etiology is not identified on noncontrast examination. Consider correlation with post contrast enhanced imaging as clinically appropriate. Multiple foci free intraperitoneal air likely secondary to attempt at insufflation within the operating room.  Abnormal appearance to left hepatic  lobe likely secondary to  altered perfusion demonstrated on prior contrast-enhanced abdomen pelvic CT.  Hepatic steatosis.  Patient status post sigmoidectomy with left lower quadrant colostomy and parastomal hernia.  Critical Value/emergent results were called by telephone at the time of interpretation on 06/24/2015 at 4:30 pm to Dr. Marcello Moores, who verbally acknowledged these results.   Electronically Signed   By: Lovey Newcomer M.D.   On: 06/02/2015 17:11   Ct Head Wo Contrast  06/18/2015   CLINICAL DATA:  Anoxic injury  EXAM: CT HEAD WITHOUT CONTRAST  TECHNIQUE: Contiguous axial images were obtained from the base of the skull through the vertex without intravenous contrast.  COMPARISON:  None.  FINDINGS: Mild generalized atrophy.  Ventricle size normal.  Mild hypodensity in the cerebral white matter consistent with chronic microvascular ischemia. No acute infarct. No evidence of brain edema. No hemorrhage or mass.  Calvarium intact.  IMPRESSION: Mild chronic microvascular ischemic change.  No acute abnormality.   Electronically Signed   By: Franchot Gallo M.D.   On: 05/30/2015 16:06   Ct Chest Wo Contrast  06/25/2015   CLINICAL DATA:  Patient brought to the operating room for colostomy takedown. During instillation common patient developed bradycardia and hypoxia.  EXAM: CT CHEST, ABDOMEN AND PELVIS WITHOUT CONTRAST  TECHNIQUE: Multidetector CT imaging of the chest, abdomen and pelvis was performed following the standard protocol without IV contrast.  COMPARISON:  CT abdomen pelvis 08/10/2014.  FINDINGS: CT CHEST FINDINGS  Mediastinum/Nodes: ET tube terminates within the distal trachea. Enteric tube terminates within the stomach. No enlarged axillary, mediastinal or hilar lymphadenopathy. Normal heart size. Coronary arterial vascular calcifications.  Lungs/Pleura: Central airways are patent. Dependent atelectasis within the bilateral lower lobes. Small bilateral pleural effusions. No definite pneumothorax.   Musculoskeletal: No aggressive or acute appearing osseous lesions.  CT ABDOMEN AND PELVIS FINDINGS  Hepatobiliary: Liver is diffusely low in attenuation compatible with hepatic steatosis. The liver is nodular in contour suggestive of cirrhosis. Small gallstone within the gallbladder lumen. Abnormal appearance to the left hepatic lobe adjacent to the falciform ligament likely corresponds with abnormal profusion of this location demonstrated prior contrast-enhanced CT.  Pancreas: Unremarkable  Spleen: Unremarkable  Adrenals/Urinary Tract: Normal adrenal glands. Stable bilateral low-attenuation renal lesions. Unchanged 7 mm nonobstructing stone inferior pole left kidney. Urinary bladder decompressed with Foley catheter.  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 No abnormal bowel wall thickening or evidence for bowel obstruction.  Vascular/Lymphatic: Normal caliber abdominal aorta. No retroperitoneal adenopathy.  Other: High attenuation blood products are demonstrated coursing around the liver, along the right paracolic gutter and into the pelvis. Moderate volume hemoperitoneum. There are multiple foci of free intraperitoneal air. Additionally there is a fat containing ventral abdominal wall hernia. Diastases of the rectus abdominus musculature.  Musculoskeletal: No aggressive or acute appearing osseous lesions. Lower lumbar spine degenerative changes.  IMPRESSION: Moderate volume hemoperitoneum coursing from the perihepatic location along the right paracolic gutter and into the pelvis. Definitive etiology is not identified on noncontrast examination. Consider correlation with post contrast enhanced imaging as clinically appropriate. Multiple foci free intraperitoneal air likely secondary to attempt at insufflation within the operating room.  Abnormal appearance to left hepatic lobe likely secondary to altered  perfusion demonstrated on prior contrast-enhanced abdomen pelvic CT.  Hepatic steatosis.  Patient status post sigmoidectomy with left lower quadrant colostomy and parastomal hernia.  Critical Value/emergent results were called by telephone at the time of interpretation on 06/23/2015 at 4:30  pm to Dr. Marcello Moores, who verbally acknowledged these results.   Electronically Signed   By: Lovey Newcomer M.D.   On: 06/04/2015 17:11   Dg Chest Port 1 View  05/31/2015   CLINICAL DATA:  Acute cardiopulmonary arrest during surgical procedure. Intubation.  EXAM: PORTABLE CHEST - 1 VIEW  COMPARISON:  07/29/2014  FINDINGS: Endotracheal tube is seen with tip approximately 1.5 cm above the carina. Nasogastric tube is seen with tip in the distal stomach.  Low lung volumes are noted however both lungs are clear. Heart size is within normal limits allowing for low lung volumes. No evidence pneumothorax or pleural effusion.  IMPRESSION: Low lung volumes. No acute findings. Endotracheal tube and nasogastric tube in appropriate position.   Electronically Signed   By: Earle Gell M.D.   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 Persistent nystagmus concerning for subclinical seizures: spoke with daughter at the bedside who is not in favor of transferring patient to [facility] for c-EEG monitoring. Daughter said that in an unfortunate situation like this it will be against her wishes to pursue aggressive intervention, and at this time she just wishes pain control, making her comfortable, and eliminating the convulsions. In the meantime, recommended administering additional 1 gram IV keppra STAT, and initiating patient on propofol. Keppra 500 mg BID starting tomorrow. EEG, routine in am. Will follow up.  Dorian Pod, MD 05/29/2015, 10:08 PM  [facility]

## 2015-06-12 NOTE — Progress Notes (Signed)
[facility] Progress Note Patient Name: Brianna Duncan DOB: Jun 03, 1946 MRN: [id number]   Date of Service  06/04/2015  HPI/Events of Note  Patient clinical findings of subclinical seizures discussed with neurology.  Recommend cont EEG monitoring but according to neurology, daughter does not want patient transferred to [facility] for EEG monitoring.  Neurology recommending additional of propofol sedation for ongoing rx of seizures.  eICU Interventions  Plan: Start propofol in addition to fentanyl Continue fentanyl     Intervention Category Major Interventions: Seizures - evaluation and management  DETERDING,ELIZABETH 06/15/2015, 9:34 PM

## 2015-06-12 NOTE — Progress Notes (Signed)
Probably had a venous gas embolus during insufflation with severe bradycardia and ETCO2 in low single digits and hypoxemia.  Resolved over 15  Minutes or so, which is why we suspect a gas embolus rather than a clot embolus.  At this point pupils are somewhat reactive to light and equal, non-dilated.  She has pulmonary edema and cardiomegaly on CXR.  Last ABG PH 7.18 PCO2 64 ( ETCO2 40 ). PO2 87.  We are supporting her BP with phenylephrine and her BP is 90/60.  Cardiac enzymes drawn.  Cardiology consult for echo etc...  Complete note is in quick notes in the intraoperative anesthesia record.   Reighn Kaplan MD

## 2015-06-12 NOTE — Consult Note (Signed)
CARDIOLOGY CONSULT NOTE       Patient ID: Brianna Duncan MRN: [id number] DOB/AGE: August 07, 1946 69 y.o.  Admit date: 05/30/2015 Referring Physician:  Donne Hazel Primary Physician: Wendie Agreste, MD Primary Cardiologist:   Reason for Consultation:  Bradycardic Arrest in OR  Active Problems:   Bradycardia   HPI:   69 y.o. with history of HTN, obesity.  Post colectomy To have revision surgery today.  After insufflation had difficulty airating with poor end tidal CO2 UE mottling and hypoxia followed by bradycardia and asystole.  No chest compressions responded To atropine and epi.  Currently NSR 110 BP 180 mmHg.  Telemetry with no ischemic changes and no AV block  ECG no acute changes Or AV block.  Bedside echo done and EF normal mild LVH RV dilated no significant valve issues and no pericardial effusion.  Currently ETT in good position with bilateral lung sounds.   CXR reviewed and stable with just low lung volumes   ROS All other systems reviewed and negative except as noted above  Past Medical History  Diagnosis Date  . Hypertension   . Allergy     seasonal  . Anxiety   . Osteoporosis     Was on   . History of kidney stones   . On hormone replacement therapy     Patch  . Depression   . H. pylori infection     02-09-2015 egd   . Colostomy in place   . Acute colitis     [facility]-Dr Donne Hazel   . CHF (congestive heart failure)     resolved with no medicines currently   . GERD (gastroesophageal reflux disease)   . 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 Bronchitis     hx of   . Chronic kidney disease     kidney stones; pt states currently has 2 stones  . Arthritis   . Cirrhosis of liver     Family History  Problem Relation Age of Onset  . Heart disease Mother   . Stroke Father   . Stroke Sister   . Heart disease Brother   . Colon cancer Neg Hx   . Rectal cancer Neg Hx   . Stomach cancer Neg Hx   . Esophageal cancer Neg Hx     History    Social History  . Marital Status: Divorced    Spouse Name: N/A  . Number of Children: 1  . Years of Education: N/A   Occupational History  . retired     retired   Social History Main Topics  . Smoking status: Former Smoker -- 0.75 packs/day for 44 years    Types: Cigarettes    Quit date: 11/29/1999  . Smokeless tobacco: Never Used     Comment: quit 15  yrs ago  . Alcohol Use: 0.6 oz/week    1 Glasses of wine per week     Comment: wine with dinner and socially  . Drug Use: No  . Sexual Activity: Yes    Birth Control/ Protection: None   Other Topics Concern  . Not on file   Social History Narrative   Patient lives at home with her sister and she is retired.   Education some college.   Right handed.   Caffeine three cups of coffee daily.    Past Surgical History  Procedure Laterality Date  . Abdominal hysterectomy  2006  . Foot surgery  2010  right foot, left great toe and left second toe surgery  . Colonoscopy  06/2014  . Liver biopsy      probable cirrhosis per report  . Laparotomy N/A 08/14/2014    Procedure: EXPLORATORY LAPAROTOMY;  Surgeon: Rolm Bookbinder, MD;  Location: [place];  Service: General;  Laterality: N/A;  . Colectomy  08/14/2014    Procedure: TOTAL COLECTOMY/COLOSTOMY;  Surgeon: Rolm Bookbinder, MD;  Location: [place];  Service: General;;  . Ganglion cyst excision Left      . fentaNYL      . heparin  5,000 Units Subcutaneous 3 times per day   . dextrose 5 % and 0.45 % NaCl with KCl 20 mEq/L    . lactated ringers      Physical Exam: Blood pressure 168/81, pulse 111, temperature 98 F (36.7 C), temperature source Oral, resp. rate 22, height 4\' 11"  (1.499 m), weight 75.921 kg (167 lb 6 oz), SpO2 99 %.   Sedated  Obese intubated white female  HEENT: normal Neck supple with no adenopathy JVP normal no bruits no thyromegaly Lungs clear with no wheezing and good diaphragmatic motion Heart:  S1/S2 no murmur, no rub, gallop or click PMI  normal Abdomen: benighn, soft colectomy  no bruit.  No HSM or HJR Distal pulses intact with no bruits No edema Neuro non-focal Skin warm and dry No muscular weakness   Labs:   Lab Results  Component Value Date   WBC 8.1 06/10/2015   HGB 15.4* 06/10/2015   HCT 45.4 06/10/2015   MCV 96.6 06/10/2015   PLT 215 06/10/2015    Recent Labs Lab 06/10/15 1345  NA 135  K 4.0  CL 102  CO2 23  BUN 16  CREATININE 0.74  CALCIUM 9.7  PROT 8.9*  BILITOT 1.2  ALKPHOS 147*  ALT 105*  AST 122*  GLUCOSE 133*   Lab Results  Component Value Date   CKTOTAL 257* 04/17/2014   No results found for: CHOL No results found for: HDL No results found for: LDLCALC No results found for: TRIG No results found for: CHOLHDL No results found for: LDLDIRECT    Radiology: Dg Chest Port 1 View  06/19/2015   CLINICAL DATA:  Acute cardiopulmonary arrest during surgical procedure. Intubation.  EXAM: PORTABLE CHEST - 1 VIEW  COMPARISON:  07/29/2014  FINDINGS: Endotracheal tube is seen with tip approximately 1.5 cm above the carina. Nasogastric tube is seen with tip in the distal stomach.  Low lung volumes are noted however both lungs are clear. Heart size is within normal limits allowing for low lung volumes. No evidence pneumothorax or pleural effusion.  IMPRESSION: Low lung volumes. No acute findings. Endotracheal tube and nasogastric tube in appropriate position.   Electronically Signed   By: Earle Gell M.D.   On: 06/02/2015 13:40    EKG:  NSR no acute ST changes    ASSESSMENT AND PLAN:  Loletha Grayer Arrest:  Seems like insufflation and aeration issues caused arrest rather than arrhythmia being primary event  Certainly rhythm and BP stable now with no signs of ischemia.  LV normal by echo.  RV dilated and with aeration issues would get CTA to r/o PE and check D dimer.  Continue telemetry  Does not need pressors at this point CCM seeing and managing vent she should be able to be extubated soon unless there  was a neurologic event during surgery Follow serial enzymes and ECG  Signed: Jenkins Rouge 06/26/2015, 2:37 PM

## 2015-06-12 NOTE — Progress Notes (Signed)
  Echocardiogram 2D Echocardiogram has been performed.  Brianna Duncan 06/11/2015, 2:15 PM

## 2015-06-12 NOTE — Progress Notes (Signed)
[facility] Progress Note Patient Name: Brianna Duncan DOB: Jan 07, 1946 MRN: [id number]   Date of Service  06/15/2015  HPI/Events of Note  Persistent seizures and nystagmus. Seizures limited to twitching of upper extremities.   eICU Interventions  Will order: 1. Continue Versed.  2. Will require continuous EEG. 3. Consult Neurology. I have spoken with Dr. Armida Sans who has graciously agreed to assist with the care of this patient.      Intervention Category Major Interventions: Seizures - evaluation and management  Brianna Duncan 06/07/2015, 8:24 PM

## 2015-06-12 NOTE — Anesthesia Preprocedure Evaluation (Signed)
Anesthesia Evaluation  Patient identified by MRN, date of birth, ID band Patient awake    Reviewed: Allergy & Precautions, H&P , NPO status , Patient's Chart, lab work & pertinent test results  Airway Mallampati: II  TM Distance: >3 FB Neck ROM: Full    Dental  (+) Upper Dentures, Partial Lower, Dental Advisory Given Front lower missing too:   Pulmonary neg pulmonary ROS, former smoker,  breath sounds clear to auscultation  Pulmonary exam normal       Cardiovascular hypertension, Pt. on medications +CHF Normal cardiovascular examRhythm:Regular Rate:Normal  CHF x 1 last september   Neuro/Psych Anxiety Depression negative neurological ROS  negative psych ROS   GI/Hepatic negative GI ROS, GERD-  Medicated and Controlled,(+) Cirrhosis -      , Cirrhosis not severe GI history noted. CE   Endo/Other  negative endocrine ROS  Renal/GU negative Renal ROS  negative genitourinary   Musculoskeletal negative musculoskeletal ROS (+)   Abdominal Normal abdominal exam  (+)   Peds negative pediatric ROS (+)  Hematology negative hematology ROS (+)   Anesthesia Other Findings   Reproductive/Obstetrics negative OB ROS                             Anesthesia Physical Anesthesia Plan  ASA: III  Anesthesia Plan: General   Post-op Pain Management:    Induction: Intravenous, Rapid sequence and Cricoid pressure planned  Airway Management Planned: Oral ETT  Additional Equipment:   Intra-op Plan:   Post-operative Plan: Extubation in OR  Informed Consent:   Plan Discussed with: Surgeon  Anesthesia Plan Comments:         Anesthesia Quick Evaluation

## 2015-06-12 NOTE — Progress Notes (Signed)
[facility] Progress Note Patient Name: Brianna Duncan DOB: Sep 01, 1946 MRN: [id number]   Date of Service  06/11/2015  HPI/Events of Note  End of life discussion with Perley Jain, patient's daughter.  She is aware that patient suffered an anoxic brain injury event during the code earlier today.  She is aware after discussing the case with Dr. Rudean Hitt, neurology that recover to the patient's prior neuro status is limited.  Based on these discussions and her mother's wishes the daughter is requesting a DNR status with no further escalation in care.   eICU Interventions  Plan: Patient made DNR with no escalation To remain on vent for now     Intervention Category Major Interventions: End of life / care limitation discussion  [place] 06/09/2015, 10:37 PM

## 2015-06-12 NOTE — Progress Notes (Signed)
Versed 2mg  given IV x 2 at 2115 and 2130 while patient was having multiple seizures.  Telephone order given by Dr. Oletta Darter and Dr. Armida Sans (neurology) at bedside with patient.

## 2015-06-12 NOTE — Consult Note (Signed)
PULMONARY / CRITICAL CARE MEDICINE   Name: Brianna Duncan MRN: [id number] DOB: 05/29/1946    ADMISSION DATE:  06/17/2015 CONSULTATION DATE:  7/15  [facility]  CHIEF COMPLAINT: Post bradycardic event  INITIAL PRESENTATION: For colostomy takedown  STUDIES:  7/15 2d>> 7/15 ct head>> 7/15 ct chest>> 7/15 ABD>>  SIGNIFICANT EVENTS: 7/15 NEAR CARDIAC ARREST   HISTORY OF PRESENT ILLNESS:   69 yo female to OR for colostomy takedown 7/15 , tolerated induction but when insufflation started via trocar per surgery she developed immediate bradycardia, hypoxia , mottled halfway up and case was stopped. She did require chest compression but did get atropine. Transported to ICU and PCCM asked to assist in her care. She is on 100% fio2 with sats 96%.   PAST MEDICAL HISTORY :   has a past medical history of Hypertension; Allergy; Anxiety; Osteoporosis; History of kidney stones; On hormone replacement therapy; Depression; H. pylori infection; Colostomy in place; Acute colitis; CHF (congestive heart failure); GERD (gastroesophageal reflux disease); Heart murmur; Bronchitis; Chronic kidney disease; Arthritis; and Cirrhosis of liver.  has past surgical history that includes Abdominal hysterectomy (2006); Foot surgery (2010); Colonoscopy (06/2014); Liver biopsy; laparotomy (N/A, 08/14/2014); Colectomy (08/14/2014); and Ganglion cyst excision (Left). Prior to Admission medications   Medication Sig Start Date End Date Taking? Authorizing Provider  ALPRAZolam (XANAX) 0.5 MG tablet TAKE 1 TABLET BY MOUTH TWICE A DAY AS NEEDED Patient taking differently: TAKE 1 TABLET BY MOUTH TWICE A DAY AS NEEDED FOR ANXIETY. 05/04/15  Yes Wendie Agreste, MD  amLODipine (NORVASC) 5 MG tablet Take 1 tablet (5 mg total) by mouth daily. 01/27/15  Yes Wendie Agreste, MD  aspirin 81 MG tablet Take 81 mg by mouth every morning.    Yes Historical Provider, MD  dimenhyDRINATE (DRAMAMINE) 50 MG tablet Take 100 mg by mouth every morning.    Yes Historical Provider, MD  estradiol (VIVELLE-DOT) 0.05 MG/24HR patch Place 1 patch onto the skin 2 (two) times a week.   Yes Historical Provider, MD  fluticasone (FLONASE) 50 MCG/ACT nasal spray Place 2 sprays into both nostrils daily as needed for allergies or rhinitis.   Yes Historical Provider, MD  lisinopril-hydrochlorothiazide (PRINZIDE,ZESTORETIC) 20-12.5 MG per tablet Take 1 tablet by mouth daily. 01/27/15  Yes Wendie Agreste, MD  Multiple Vitamin (MULTIVITAMIN) tablet Take 1 tablet by mouth every morning.    Yes Historical Provider, MD  omeprazole (PRILOSEC) 20 MG capsule Take 1 capsule (20 mg total) by mouth daily. 02/09/15  Yes Milus Banister, MD  Probiotic Product (PROBIOTIC DAILY PO) Take 1 tablet by mouth every morning.    Yes Historical Provider, MD  sertraline (ZOLOFT) 100 MG tablet Take 1 tablet (100 mg total) by mouth daily. 01/26/15  Yes Wendie Agreste, MD   Allergies  Allergen Reactions  . Ivp Dye [Iodinated Diagnostic Agents] Hives  . Morphine And Related Other (See Comments)    Hallucinations    FAMILY HISTORY:  indicated that her mother is deceased. She indicated that her father is deceased. She indicated that her sister is deceased. She indicated that her brother is deceased.  SOCIAL HISTORY:  reports that she quit smoking about 15 years ago. Her smoking use included Cigarettes. She has a 33 pack-year smoking history. She has never used smokeless tobacco. She reports that she drinks about 0.6 oz of alcohol per week. She reports that she does not use illicit drugs.  REVIEW OF SYSTEMS: NA  SUBJECTIVE:   VITAL SIGNS: Temp:  [  98 F (36.7 C)] 98 F (36.7 C) (07/15 0940) Pulse Rate:  [100-102] 102 (07/15 1345) Resp:  [20-25] 25 (07/15 1345) BP: (168)/(81) 168/81 mmHg (07/15 0940) SpO2:  [94 %-99 %] 99 % (07/15 1345) Arterial Line BP: (139)/(78) 139/78 mmHg (07/15 1345) FiO2 (%):  [100 %] 100 % (07/15 1402) Weight:  [167 lb 6 oz (75.921 kg)] 167 lb 6 oz (75.921  kg) (07/15 0951) HEMODYNAMICS:   VENTILATOR SETTINGS: Vent Mode:  [-] PRVC FiO2 (%):  [100 %] 100 % Set Rate:  [16 bmp] 16 bmp Vt Set:  [350 mL] 350 mL PEEP:  [5 cmH20] 5 cmH20 Plateau Pressure:  [14 cmH20] 14 cmH20 INTAKE / OUTPUT:  Intake/Output Summary (Last 24 hours) at 06/17/2015 1407 Last data filed at 06/22/2015 1350  Gross per 24 hour  Intake   1350 ml  Output    200 ml  Net   1150 ml    PHYSICAL EXAMINATION: General:  Sedated on vent Neuro:  No response to painful stimuli but under anesthetics  HEENT:  PERL 4 mm, no jvd Cardiovascular:  HSR RRR Lungs:  CTA Abdomen:  Distended from CO2  Musculoskeletal: Intact Skin: cool and dry  LABS:  CBC  Recent Labs Lab 06/10/15 1345  WBC 8.1  HGB 15.4*  HCT 45.4  PLT 215   Coag's No results for input(s): APTT, INR in the last 168 hours. BMET  Recent Labs Lab 06/10/15 1345  NA 135  K 4.0  CL 102  CO2 23  BUN 16  CREATININE 0.74  GLUCOSE 133*   Electrolytes  Recent Labs Lab 06/10/15 1345  CALCIUM 9.7   Sepsis Markers No results for input(s): LATICACIDVEN, PROCALCITON, O2SATVEN in the last 168 hours. ABG  Recent Labs Lab 06/20/2015 1250 05/31/2015 1315  PHART 7.220* 7.183*  PCO2ART 60.1* 63.6*  PO2ART 41.3* 87.4   Liver Enzymes  Recent Labs Lab 06/10/15 1345  AST 122*  ALT 105*  ALKPHOS 147*  BILITOT 1.2  ALBUMIN 4.6   Cardiac Enzymes No results for input(s): TROPONINI, PROBNP in the last 168 hours. Glucose No results for input(s): GLUCAP in the last 168 hours.  Imaging Dg Chest Port 1 View  06/01/2015   CLINICAL DATA:  Acute cardiopulmonary arrest during surgical procedure. Intubation.  EXAM: PORTABLE CHEST - 1 VIEW  COMPARISON:  07/29/2014  FINDINGS: Endotracheal tube is seen with tip approximately 1.5 cm above the carina. Nasogastric tube is seen with tip in the distal stomach.  Low lung volumes are noted however both lungs are clear. Heart size is within normal limits allowing for low  lung volumes. No evidence pneumothorax or pleural effusion.  IMPRESSION: Low lung volumes. No acute findings. Endotracheal tube and nasogastric tube in appropriate position.   Electronically Signed   By: Earle Gell M.D.   On: 06/20/2015 13:40     ASSESSMENT / PLAN:  PULMONARY OETT 7/15>> A: Post near arrest, severe hypoxia, ? PE vs Air embolism vs neuro event P:   Vent bundle Ct head/chest/abd for cause  CARDIOVASCULAR CVL A:  Post bradycardiac near arrest P:  Check 2 D echo Cards consulted Hold antihypertensives  RENAL A:   No acute issue P:   Check creatine for completeness  GASTROINTESTINAL A:  Colitis with colostomy P:   Per [facility]  HEMATOLOGIC A:   No acute issue P:    INFECTIOUS A:  No infectious process noted P:    ENDOCRINE A:   No acute issue  P:  NEUROLOGIC A:   Sedated on vent as anesthesia not reversed P:   RASS goal: -1 Stat ct head ro neurological event   FAMILY  - Updates:   - Inter-disciplinary family meet or Palliative Care meeting due by:  day 7    TODAY'S SUMMARY:  69 yo female to OR for colostomy takedown 7/15 , tolerated induction but when insufflation started via trocar per surgery she developed immediate bradycardia, hypoxia , mottled halfway up and case was stopped. She did require chest compression but did get atropine. Transported to ICU and PCCM asked to assist in her care. She is on 100% fio2 with sats 96%.       Richardson Landry Minor ACNP Maryanna Shape PCCM Pager [phone] till 3 pm If no answer page [phone] 06/06/2015, 2:08 PM  Reviewed above, examined.  She was coming in for revision of colostomy.  She was intubated, and put under anesthesia.  She had trocar inserted and insufflated in preparation for surgery.  She then developed bradycardia, and refractory hypoxia.  This went on for about 5 to 10 minutes.  She was started on pressors.  She eventually had return of her oxygenation and is now maintaining her BP off  pressors.  She remains on vent.  She is unresponsive, but still likely under effects of anesthesia.  She has anisocoria, ETT in place, equal breath sounds, normal heart rate, soft abdomen with colostomy in place, no edema, no rashes.  Concern is for air embolism.  Also concern for anoxic encephalopathy.  Her Echo was unrevealing.  Will check CT head and doppler legs.  Will get non contrast CT chest >> can't do CT angiogram do to contrast allergy.  D/w Dr. Donne Hazel.  Chesley Mires, MD [facility] Pulmonary/Critical Care 06/22/2015, 2:55 PM Pager:  [phone] After 3pm call: [phone]

## 2015-06-12 NOTE — Transfer of Care (Signed)
Immediate Anesthesia Transfer of Care Note  Patient: Brianna Duncan  Procedure(s) Performed: Procedure(s) with comments: ATTEMPTED LAPAROSCOPIC COLOSTOMY TAKEDOWN (N/A) - CASE CANCELLED DUE TO CARDIO VASCULAR COLLAPSE   Patient Location: ICU  Anesthesia Type:General  Level of Consciousness: Patient remains intubated per anesthesia plan  Airway & Oxygen Therapy: Patient remains intubated per anesthesia plan  Post-op Assessment: Report given to RN and Post -op Vital signs reviewed and stable  Post vital signs: Reviewed and stable  Last Vitals:  Filed Vitals:   06/25/2015 0940  BP: 168/81  Pulse: 100  Temp: 36.7 C  Resp: 20    Complications: No apparent anesthesia complications and ICU admission related to surgical incident.

## 2015-06-12 NOTE — Progress Notes (Signed)
At 2100, received telephone order from Dr. Oletta Darter via elink that patient may be restrained for patient safety.  Pt had disconnected herself from vent more than one time during seizures in spite of medication and mittens.  Patient's daughter was a bedside with patient and consented to restraints for patient safety.  Restraints have now been d/cd.  Pt is on the vent and on diprivan.  Will keep mittens on patient prophylactically.

## 2015-06-13 ENCOUNTER — Inpatient Hospital Stay (HOSPITAL_COMMUNITY): Payer: Medicare Other

## 2015-06-13 DIAGNOSIS — G931 Anoxic brain damage, not elsewhere classified: Secondary | ICD-10-CM

## 2015-06-13 DIAGNOSIS — R569 Unspecified convulsions: Secondary | ICD-10-CM

## 2015-06-13 DIAGNOSIS — J9690 Respiratory failure, unspecified, unspecified whether with hypoxia or hypercapnia: Secondary | ICD-10-CM

## 2015-06-13 DIAGNOSIS — G4089 Other seizures: Secondary | ICD-10-CM

## 2015-06-13 DIAGNOSIS — R0902 Hypoxemia: Secondary | ICD-10-CM

## 2015-06-13 DIAGNOSIS — J9601 Acute respiratory failure with hypoxia: Secondary | ICD-10-CM

## 2015-06-13 LAB — CBC
HCT: 38.9 % (ref 36.0–46.0)
HCT: 35.9 % — ABNORMAL LOW (ref 36.0–46.0)
HEMOGLOBIN: 12.1 g/dL (ref 12.0–15.0)
Hemoglobin: 11.1 g/dL — ABNORMAL LOW (ref 12.0–15.0)
MCH: 32.6 pg (ref 26.0–34.0)
MCH: 32.5 pg (ref 26.0–34.0)
MCHC: 31.1 g/dL (ref 30.0–36.0)
MCHC: 30.9 g/dL (ref 30.0–36.0)
MCV: 104.9 fL — AB (ref 78.0–100.0)
MCV: 105 fL — ABNORMAL HIGH (ref 78.0–100.0)
Platelets: 251 10*3/uL (ref 150–400)
Platelets: 205 10*3/uL (ref 150–400)
RBC: 3.71 MIL/uL — AB (ref 3.87–5.11)
RBC: 3.42 MIL/uL — ABNORMAL LOW (ref 3.87–5.11)
RDW: 14.7 % (ref 11.5–15.5)
RDW: 15 % (ref 11.5–15.5)
WBC: 22 10*3/uL — ABNORMAL HIGH (ref 4.0–10.5)
WBC: 15.4 10*3/uL — AB (ref 4.0–10.5)

## 2015-06-13 LAB — COMPREHENSIVE METABOLIC PANEL
ALT: 81 U/L — ABNORMAL HIGH (ref 14–54)
AST: 100 U/L — AB (ref 15–41)
Albumin: 3 g/dL — ABNORMAL LOW (ref 3.5–5.0)
Alkaline Phosphatase: 90 U/L (ref 38–126)
Anion gap: 6 (ref 5–15)
BILIRUBIN TOTAL: 0.4 mg/dL (ref 0.3–1.2)
BUN: 11 mg/dL (ref 6–20)
CO2: 22 mmol/L (ref 22–32)
Calcium: 7.5 mg/dL — ABNORMAL LOW (ref 8.9–10.3)
Chloride: 109 mmol/L (ref 101–111)
Creatinine, Ser: 0.95 mg/dL (ref 0.44–1.00)
GFR calc Af Amer: >60 mL/min (ref >60)
GFR, EST NON AFRICAN AMERICAN: 60 mL/min — AB (ref >60)
GLUCOSE: 254 mg/dL — AB (ref 65–99)
POTASSIUM: 4.5 mmol/L (ref 3.5–5.1)
SODIUM: 137 mmol/L (ref 135–145)
TOTAL PROTEIN: 6.3 g/dL — AB (ref 6.5–8.1)

## 2015-06-13 LAB — MRSA PCR SCREENING: MRSA BY PCR: NEGATIVE

## 2015-06-13 LAB — PHOSPHORUS: Phosphorus: 3.7 mg/dL (ref 2.5–4.6)

## 2015-06-13 LAB — MAGNESIUM: MAGNESIUM: 1.7 mg/dL (ref 1.7–2.4)

## 2015-06-13 LAB — PROTIME-INR
INR: 1.07 (ref 0.00–1.49)
Prothrombin Time: 14.1 seconds (ref 11.6–15.2)

## 2015-06-13 LAB — TROPONIN I: Troponin I: 0.14 ng/mL — ABNORMAL HIGH (ref <0.031)

## 2015-06-13 MED ORDER — CETYLPYRIDINIUM CHLORIDE 0.05 % MT LIQD
7.0000 mL | Freq: Four times a day (QID) | OROMUCOSAL | Status: DC
  Administered 2015-06-14 – 2015-06-16 (×9): 7 mL via OROMUCOSAL

## 2015-06-13 MED ORDER — VALPROATE SODIUM 500 MG/5ML IV SOLN
1500.0000 mg | Freq: Once | INTRAVENOUS | Status: AC
  Administered 2015-06-13: 1500 mg via INTRAVENOUS
  Filled 2015-06-13: qty 15

## 2015-06-13 MED ORDER — CHLORHEXIDINE GLUCONATE 0.12 % MT SOLN
15.0000 mL | Freq: Two times a day (BID) | OROMUCOSAL | Status: DC
  Administered 2015-06-13 – 2015-06-14 (×2): 15 mL via OROMUCOSAL
  Filled 2015-06-13: qty 15

## 2015-06-13 MED ORDER — SODIUM CHLORIDE 0.9 % IV SOLN
1000.0000 mg | Freq: Two times a day (BID) | INTRAVENOUS | Status: DC
  Filled 2015-06-13: qty 10

## 2015-06-13 MED ORDER — VALPROATE SODIUM 500 MG/5ML IV SOLN
500.0000 mg | Freq: Three times a day (TID) | INTRAVENOUS | Status: DC
  Administered 2015-06-14 – 2015-06-16 (×7): 500 mg via INTRAVENOUS
  Filled 2015-06-13 (×9): qty 5

## 2015-06-13 MED ORDER — LEVETIRACETAM 500 MG/5ML IV SOLN
1500.0000 mg | Freq: Two times a day (BID) | INTRAVENOUS | Status: DC
  Administered 2015-06-13 – 2015-06-15 (×5): 1500 mg via INTRAVENOUS
  Filled 2015-06-13 (×6): qty 15

## 2015-06-13 MED ORDER — SODIUM CHLORIDE 0.9 % IV SOLN
500.0000 mg | Freq: Once | INTRAVENOUS | Status: AC
  Administered 2015-06-13: 500 mg via INTRAVENOUS
  Filled 2015-06-13: qty 5

## 2015-06-13 MED ORDER — ARTIFICIAL TEARS OP OINT
TOPICAL_OINTMENT | Freq: Three times a day (TID) | OPHTHALMIC | Status: DC
  Administered 2015-06-13 – 2015-06-15 (×6): via OPHTHALMIC
  Filled 2015-06-13 (×2): qty 3.5

## 2015-06-13 NOTE — Progress Notes (Signed)
Subjective: No significant changes, continues to have seizures,   Currently being loaded with depakote.   Exam: Filed Vitals:   06/13/15 1900  BP:   Pulse: 89  Temp:   Resp: 19   Gen: In bed, NAD Resp: Ventilated  Abd: soft, nt  Neuro: MS: Does not open eyes, does not follow commands CN: Eyes midline, pupils reactive bilaterally, blinks to eyelid.  stimulation bilaterally Motor: does not withdraw Sensory:as above  She continues to have subtle clonic activity involving the head as well as the right arm. Intermittently she generalizes and has generalized tonic-clonic activity.  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 She discussed with Dr. Aram Beecham being more aggressive with continuous EEG and induced coma and had decided against a very aggressive route given that she likely has an anoxic injury. Now, with 24 hours of refractory post-anoxic seizures, I think that the chance of her having a good recovery is even slimmer, but will continue to treat seizures symptomatically.   Family plans likely withdrawal if no improvement by tomorrow.   Recommendations: 1) Finish depakote load 2) increase keppra to 1500mg  BID.  3) will continue to follow.   Roland Rack, MD [facility] [phone]  If 7pm- 7am, please page neurology on call as listed in [place].

## 2015-06-13 NOTE — Progress Notes (Signed)
UR Completed. Shanteria Laye, RN, BSN.  [phone] 

## 2015-06-13 NOTE — Progress Notes (Signed)
PULMONARY / CRITICAL CARE MEDICINE   Name: Brianna Duncan MRN: [id number] DOB: 09-11-1946    ADMISSION DATE:  06/19/2015 CONSULTATION DATE:  7/15  [facility]  CHIEF COMPLAINT: Post bradycardic event  INITIAL PRESENTATION: For colostomy takedown  STUDIES:  7/15 2d>> 7/15 ct head>> no m/s/b 7/15 ct chest>>atelectasis 7/15 ABD>> Moderate volume hemoperitoneum   SIGNIFICANT EVENTS: 7/15 NEAR CARDIAC ARREST - intra op severe bradycardia and ETCO2 in low single digits and hypoxemia. Resolved over 15 Minutes  7/15 GTC seizures , 4-5 episodes - loaded with keppra. Versed , neuro consulted   HISTORY OF PRESENT ILLNESS:   69 yo female to OR for colostomy takedown 7/15 , tolerated induction but when insufflation started via trocar per surgery she developed immediate bradycardia, hypoxia , mottled halfway up and case was stopped. She did require chest compression but did get atropine. Transported to ICU and PCCM asked to assist in her care. She is on 100% fio2 with sats 96%.    SUBJECTIVE: sedated on propofol gtt Soft BP GTC seizure this am UO ok  VITAL SIGNS: Temp:  [97.6 F (36.4 C)-102.1 F (38.9 C)] 98.7 F (37.1 C) (07/16 0700) Pulse Rate:  [90-135] 90 (07/16 0800) Resp:  [16-34] 20 (07/16 0800) BP: (66-145)/(38-78) 100/54 mmHg (07/15 2300) SpO2:  [89 %-99 %] 96 % (07/16 0800) Arterial Line BP: (44-200)/(24-107) 91/44 mmHg (07/16 0800) FiO2 (%):  [100 %] 100 % (07/16 0809) HEMODYNAMICS:   VENTILATOR SETTINGS: Vent Mode:  [-] PRVC FiO2 (%):  [100 %] 100 % Set Rate:  [16 bmp-20 bmp] 20 bmp Vt Set:  [350 mL] 350 mL PEEP:  [5 cmH20] 5 cmH20 Plateau Pressure:  [10 cmH20-14 cmH20] 14 cmH20 INTAKE / OUTPUT:  Intake/Output Summary (Last 24 hours) at 06/13/15 1024 Last data filed at 06/13/15 0800  Gross per 24 hour  Intake 5485.62 ml  Output   1800 ml  Net 3685.62 ml    PHYSICAL EXAMINATION: General:  Sedated on vent, acutely ill Neuro: RASS-5 on propofol gtt, rt gaze  preference, pupils 71mm RTL HEENT:   no jvd Cardiovascular:  HSR RRR Lungs:  CTA Abdomen:  Soft, distended Musculoskeletal: Intact Skin: cool and dry  LABS:  CBC  Recent Labs Lab 06/10/15 1345 05/30/2015 1812 06/24/2015 2242  WBC 8.1 24.4* 28.6*  HGB 15.4* 13.8 12.6  HCT 45.4 42.8 38.7  PLT 215 316 316   Coag's  Recent Labs Lab 06/03/2015 1311 06/13/2015 1812 06/13/15 0256  APTT 34  --   --   INR 1.17 1.09 1.07   BMET  Recent Labs Lab 06/10/15 1345 06/13/15 0256  NA 135 137  K 4.0 4.5  CL 102 109  CO2 23 22  BUN 16 11  CREATININE 0.74 0.95  GLUCOSE 133* 254*   Electrolytes  Recent Labs Lab 06/10/15 1345 06/13/15 0256  CALCIUM 9.7 7.5*  MG  --  1.7  PHOS  --  3.7   Sepsis Markers  Recent Labs Lab 06/11/2015 1313  LATICACIDVEN 1.4   ABG  Recent Labs Lab 06/18/2015 1315 06/24/2015 1416 06/01/2015 1639  PHART 7.183* 7.206* 7.337*  PCO2ART 63.6* 59.0* 37.4  PO2ART 87.4 158* 111*   Liver Enzymes  Recent Labs Lab 06/10/15 1345 06/13/15 0256  AST 122* 100*  ALT 105* 81*  ALKPHOS 147* 90  BILITOT 1.2 0.4  ALBUMIN 4.6 3.0*   Cardiac Enzymes  Recent Labs Lab 06/18/2015 1839 06/13/15 0203  TROPONINI 0.13* 0.14*   Glucose No results for input(s): GLUCAP in  the last 168 hours.  Imaging Ct Abdomen Pelvis Wo Contrast  06/02/2015   CLINICAL DATA:  Patient brought to the operating room for colostomy takedown. During instillation common patient developed bradycardia and hypoxia.  EXAM: CT CHEST, ABDOMEN AND PELVIS WITHOUT CONTRAST  TECHNIQUE: Multidetector CT imaging of the chest, abdomen and pelvis was performed following the standard protocol without IV contrast.  COMPARISON:  CT abdomen pelvis 08/10/2014.  FINDINGS: CT CHEST FINDINGS  Mediastinum/Nodes: ET tube terminates within the distal trachea. Enteric tube terminates within the stomach. No enlarged axillary, mediastinal or hilar lymphadenopathy. Normal heart size. Coronary arterial vascular  calcifications.  Lungs/Pleura: Central airways are patent. Dependent atelectasis within the bilateral lower lobes. Small bilateral pleural effusions. No definite pneumothorax.  Musculoskeletal: No aggressive or acute appearing osseous lesions.  CT ABDOMEN AND PELVIS FINDINGS  Hepatobiliary: Liver is diffusely low in attenuation compatible with hepatic steatosis. The liver is nodular in contour suggestive of cirrhosis. Small gallstone within the gallbladder lumen. Abnormal appearance to the left hepatic lobe adjacent to the falciform ligament likely corresponds with abnormal profusion of this location demonstrated prior contrast-enhanced CT.  Pancreas: Unremarkable  Spleen: Unremarkable  Adrenals/Urinary Tract: Normal adrenal glands. Stable bilateral low-attenuation renal lesions. Unchanged 7 mm nonobstructing stone inferior pole left kidney. Urinary bladder decompressed with Foley catheter.  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 No abnormal bowel wall thickening or evidence for bowel obstruction.  Vascular/Lymphatic: Normal caliber abdominal aorta. No retroperitoneal adenopathy.  Other: High attenuation blood products are demonstrated coursing around the liver, along the right paracolic gutter and into the pelvis. Moderate volume hemoperitoneum. There are multiple foci of free intraperitoneal air. Additionally there is a fat containing ventral abdominal wall hernia. Diastases of the rectus abdominus musculature.  Musculoskeletal: No aggressive or acute appearing osseous lesions. Lower lumbar spine degenerative changes.  IMPRESSION: Moderate volume hemoperitoneum coursing from the perihepatic location along the right paracolic gutter and into the pelvis. Definitive etiology is not identified on noncontrast examination. Consider correlation with post contrast enhanced imaging as clinically appropriate.  Multiple foci free intraperitoneal air likely secondary to attempt at insufflation within the operating room.  Abnormal appearance to left hepatic lobe likely secondary to altered perfusion demonstrated on prior contrast-enhanced abdomen pelvic CT.  Hepatic steatosis.  Patient status post sigmoidectomy with left lower quadrant colostomy and parastomal hernia.  Critical Value/emergent results were called by telephone at the time of interpretation on 06/09/2015 at 4:30 pm to Dr. Marcello Moores, who verbally acknowledged these results.   Electronically Signed   By: Lovey Newcomer M.D.   On: 06/13/2015 17:11   Ct Head Wo Contrast  06/05/2015   CLINICAL DATA:  Anoxic injury  EXAM: CT HEAD WITHOUT CONTRAST  TECHNIQUE: Contiguous axial images were obtained from the base of the skull through the vertex without intravenous contrast.  COMPARISON:  None.  FINDINGS: Mild generalized atrophy.  Ventricle size normal.  Mild hypodensity in the cerebral white matter consistent with chronic microvascular ischemia. No acute infarct. No evidence of brain edema. No hemorrhage or mass.  Calvarium intact.  IMPRESSION: Mild chronic microvascular ischemic change.  No acute abnormality.   Electronically Signed   By: Franchot Gallo M.D.   On: 06/04/2015 16:06   Ct Chest Wo Contrast  06/24/2015   CLINICAL DATA:  Patient brought to the operating room for colostomy takedown. During instillation common patient developed bradycardia and hypoxia.  EXAM: CT CHEST, ABDOMEN AND PELVIS  WITHOUT CONTRAST  TECHNIQUE: Multidetector CT imaging of the chest, abdomen and pelvis was performed following the standard protocol without IV contrast.  COMPARISON:  CT abdomen pelvis 08/10/2014.  FINDINGS: CT CHEST FINDINGS  Mediastinum/Nodes: ET tube terminates within the distal trachea. Enteric tube terminates within the stomach. No enlarged axillary, mediastinal or hilar lymphadenopathy. Normal heart size. Coronary arterial vascular calcifications.  Lungs/Pleura: Central  airways are patent. Dependent atelectasis within the bilateral lower lobes. Small bilateral pleural effusions. No definite pneumothorax.  Musculoskeletal: No aggressive or acute appearing osseous lesions.  CT ABDOMEN AND PELVIS FINDINGS  Hepatobiliary: Liver is diffusely low in attenuation compatible with hepatic steatosis. The liver is nodular in contour suggestive of cirrhosis. Small gallstone within the gallbladder lumen. Abnormal appearance to the left hepatic lobe adjacent to the falciform ligament likely corresponds with abnormal profusion of this location demonstrated prior contrast-enhanced CT.  Pancreas: Unremarkable  Spleen: Unremarkable  Adrenals/Urinary Tract: Normal adrenal glands. Stable bilateral low-attenuation renal lesions. Unchanged 7 mm nonobstructing stone inferior pole left kidney. Urinary bladder decompressed with Foley catheter.  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 No abnormal bowel wall thickening or evidence for bowel obstruction.  Vascular/Lymphatic: Normal caliber abdominal aorta. No retroperitoneal adenopathy.  Other: High attenuation blood products are demonstrated coursing around the liver, along the right paracolic gutter and into the pelvis. Moderate volume hemoperitoneum. There are multiple foci of free intraperitoneal air. Additionally there is a fat containing ventral abdominal wall hernia. Diastases of the rectus abdominus musculature.  Musculoskeletal: No aggressive or acute appearing osseous lesions. Lower lumbar spine degenerative changes.  IMPRESSION: Moderate volume hemoperitoneum coursing from the perihepatic location along the right paracolic gutter and into the pelvis. Definitive etiology is not identified on noncontrast examination. Consider correlation with post contrast enhanced imaging as clinically appropriate. Multiple foci free intraperitoneal air likely  secondary to attempt at insufflation within the operating room.  Abnormal appearance to left hepatic lobe likely secondary to altered perfusion demonstrated on prior contrast-enhanced abdomen pelvic CT.  Hepatic steatosis.  Patient status post sigmoidectomy with left lower quadrant colostomy and parastomal hernia.  Critical Value/emergent results were called by telephone at the time of interpretation on 06/09/2015 at 4:30 pm to Dr. Marcello Moores, who verbally acknowledged these results.   Electronically Signed   By: Lovey Newcomer M.D.   On: 05/31/2015 17:11   Dg Chest Port 1 View  06/13/2015   CLINICAL DATA:  Anoxia. History of hypertension, CHF, heart murmur, bronchitis.  EXAM: PORTABLE CHEST - 1 VIEW  COMPARISON:  06/21/2015  FINDINGS: Endotracheal tube is in place with tip 3.3 cm above carina. Nasogastric tube tip is beyond the image and beyond the level of the mid stomach.  Heart size is mildly enlarged. There is increased bilateral lower lobe opacity, obscuring the hemidiaphragms bilaterally. Bilateral pleural effusions are present.  IMPRESSION: 1. Interval development of bilateral lower lobe infiltrates, right greater than left. 2. Bilateral pleural effusions.   Electronically Signed   By: Nolon Nations M.D.   On: 06/13/2015 07:27   Dg Chest Port 1 View  06/25/2015   CLINICAL DATA:  Acute cardiopulmonary arrest during surgical procedure. Intubation.  EXAM: PORTABLE CHEST - 1 VIEW  COMPARISON:  07/29/2014  FINDINGS: Endotracheal tube is seen with tip approximately 1.5 cm above the carina. Nasogastric tube is seen with tip in the distal stomach.  Low lung volumes are noted however both lungs are clear. Heart size is  within normal limits allowing for low lung volumes. No evidence pneumothorax or pleural effusion.  IMPRESSION: Low lung volumes. No acute findings. Endotracheal tube and nasogastric tube in appropriate position.   Electronically Signed   By: Earle Gell M.D.   On: 06/15/2015 13:40     ASSESSMENT /  PLAN:  PULMONARY OETT 7/15>> A: Post near arrest, severe hypoxia, ? PE vs Air embolism  P:   Vent bundle   CARDIOVASCULAR CVL A:  Post bradycardiac near arrest 07/2014 EF 40% P:  Await 2 D echo Cards consulted Hold antihypertensives No pressors  RENAL A:   At risk AKI P:    BMET & replete lytes as needed  GASTROINTESTINAL A:  Colitis with colostomy P:   Per [facility]  HEMATOLOGIC A:   Leucocytosis P:    INFECTIOUS A:  No infectious process noted P:    ENDOCRINE A:   No acute issue  P:  CBGs, avoid hypoglycemia   NEUROLOGIC A:  Anoxic encephalopathy seizures P:   RASS goal: -2 Propofol gtt - titrate to absence of seizures Keppra Neuro following    FAMILY  - Updates: daughter Lelon Frohlich 7/16 in detail  - Inter-disciplinary family meet or Palliative Care meeting due by:  day 7    TODAY'S SUMMARY: Concern is for air embolism.  Also concern for anoxic encephalopathy.  Per d/w daughter Lelon Frohlich, will wait another 24 h for more accurate neuro prognosis - if seizures continue, she may request withdrawal of care - certainly mom would not want to be alive if neuro recovery was not complete.  The patient is critically ill with multiple organ systems failure and requires high complexity decision making for assessment and support, frequent evaluation and titration of therapies, application of advanced monitoring technologies and extensive interpretation of multiple databases. Critical Care Time devoted to patient care services described in this note independent of APP time is 45 minutes.   Kara Mead MD. [facility] Flood. [facility] Pager [phone] If no response call [phone]     06/13/2015, 10:24 AM

## 2015-06-13 NOTE — Plan of Care (Signed)
Problem: Phase I Progression Outcomes Goal: Code status addressed with pt/family Outcome: Completed/Met Date Met:  06/13/15 Discussed at great length code status with daughter and friend.  Daughter expressed that her mother, "never wanted to live like this".  Dr Deterding notified of patients wishes per daughter.   Goal: Pain controlled with appropriate interventions Outcome: Completed/Met Date Met:  06/13/15 On Fentanyl and diprivan. Pt appears confortable.

## 2015-06-13 NOTE — Progress Notes (Signed)
Events yesterday noted. I reviewed her echo and there is no evidence for right heart strain, therefore doubt PE. More concerning is her recurrent GTC seizure activity which is concerning for anoxic brain injury. Change in code status to DNR noted. Bradycardia has resolved. Troponin is mildly elevated, but flat, consistent with demand ischemia - likely hypoxemia. No evidence for acute coronary syndrome.  Nothing further to add from a cardiac standpoint at this time. Will sign-off for now. Call with questions.  Pixie Casino, MD, [facility] Attending Cardiologist [place]

## 2015-06-13 NOTE — Plan of Care (Signed)
Problem: Phase I Progression Outcomes Goal: Pain controlled with appropriate interventions Outcome: Completed/Met Date Met:  06/13/15 Fentanyl drip infusing. Goal: Hemodynamically stable Outcome: Progressing Pt is comfort care status. No escalation in care at this time.

## 2015-06-13 NOTE — Progress Notes (Signed)
VASCULAR LAB PRELIMINARY  PRELIMINARY  PRELIMINARY  PRELIMINARY  Bilateral lower extremity venous Dopplers completed.    Preliminary report:  There is no DVT or SVT noted in the bilateral lower extremities.   Rori Goar, RVT 06/13/2015, 11:04 AM

## 2015-06-13 NOTE — Progress Notes (Signed)
During the evening, patient has has several seizure episodes.  The tonic clonic seizure activity I noted lasted <2 min approx 3 times up until 2230. Keppra, versed and propofol have been given.  Pt has maintained a patent airway on the vent with biteblock in place.  MDs have been updated and patient has been seen by Neurology.  Daughter, Webb Silversmith, has been at the patient bedside several times this evening.

## 2015-06-13 NOTE — Progress Notes (Signed)
Patient ID: Brianna Duncan, female   DOB: [birth year], 69 y.o.   MRN: [id number] 1 Day Post-Op  Subjective: Unresponsive on vent  Objective: Vital signs in last 24 hours: Temp:  [97.6 F (36.4 C)-102.1 F (38.9 C)] 98.7 F (37.1 C) (07/16 0700) Pulse Rate:  [92-135] 92 (07/16 0700) Resp:  [16-34] 20 (07/16 0700) BP: (66-168)/(38-81) 100/54 mmHg (07/15 2300) SpO2:  [89 %-99 %] 97 % (07/16 0700) Arterial Line BP: (44-200)/(24-107) 95/44 mmHg (07/16 0700) FiO2 (%):  [100 %] 100 % (07/16 0329) Weight:  [75.921 kg (167 lb 6 oz)] 75.921 kg (167 lb 6 oz) (07/15 0951) Last BM Date: 05/30/2015  Intake/Output from previous day: 07/15 0701 - 07/16 0700 In: 5082.7 [I.V.:2567.7; IV Piggyback:1155] Out: 1000 [Urine:1000] Intake/Output this shift:    General appearance: On vent not responsive Resp: clear to auscultation bilaterally Cardio: regular rate and rhythm GI: Soft, non distended, colostomy functioning Neurologic: Not responsive to pain, pupils small and minimally reactive, nystagmus present Incision/Wound: Clean and dry  Lab Results:   Recent Labs  06/22/2015 1812 06/03/2015 2242  WBC 24.4* 28.6*  HGB 13.8 12.6  HCT 42.8 38.7  PLT 316 316   BMET  Recent Labs  06/10/15 1345 06/13/15 0256  NA 135 137  K 4.0 4.5  CL 102 109  CO2 23 22  GLUCOSE 133* 254*  BUN 16 11  CREATININE 0.74 0.95  CALCIUM 9.7 7.5*     Studies/Results: Ct Abdomen Pelvis Wo Contrast  06/17/2015   CLINICAL DATA:  Patient brought to the operating room for colostomy takedown. During instillation common patient developed bradycardia and hypoxia.  EXAM: CT CHEST, ABDOMEN AND PELVIS WITHOUT CONTRAST  TECHNIQUE: Multidetector CT imaging of the chest, abdomen and pelvis was performed following the standard protocol without IV contrast.  COMPARISON:  CT abdomen pelvis 08/10/2014.  FINDINGS: CT CHEST FINDINGS  Mediastinum/Nodes: ET tube terminates within the distal trachea. Enteric tube terminates within  the stomach. No enlarged axillary, mediastinal or hilar lymphadenopathy. Normal heart size. Coronary arterial vascular calcifications.  Lungs/Pleura: Central airways are patent. Dependent atelectasis within the bilateral lower lobes. Small bilateral pleural effusions. No definite pneumothorax.  Musculoskeletal: No aggressive or acute appearing osseous lesions.  CT ABDOMEN AND PELVIS FINDINGS  Hepatobiliary: Liver is diffusely low in attenuation compatible with hepatic steatosis. The liver is nodular in contour suggestive of cirrhosis. Small gallstone within the gallbladder lumen. Abnormal appearance to the left hepatic lobe adjacent to the falciform ligament likely corresponds with abnormal profusion of this location demonstrated prior contrast-enhanced CT.  Pancreas: Unremarkable  Spleen: Unremarkable  Adrenals/Urinary Tract: Normal adrenal glands. Stable bilateral low-attenuation renal lesions. Unchanged 7 mm nonobstructing stone inferior pole left kidney. Urinary bladder decompressed with Foley catheter.  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 No abnormal bowel wall thickening or evidence for bowel obstruction.  Vascular/Lymphatic: Normal caliber abdominal aorta. No retroperitoneal adenopathy.  Other: High attenuation blood products are demonstrated coursing around the liver, along the right paracolic gutter and into the pelvis. Moderate volume hemoperitoneum. There are multiple foci of free intraperitoneal air. Additionally there is a fat containing ventral abdominal wall hernia. Diastases of the rectus abdominus musculature.  Musculoskeletal: No aggressive or acute appearing osseous lesions. Lower lumbar spine degenerative changes.  IMPRESSION: Moderate volume hemoperitoneum coursing from the perihepatic location along the right paracolic gutter and into the pelvis. Definitive etiology is not identified  on noncontrast examination. Consider correlation with  post contrast enhanced imaging as clinically appropriate. Multiple foci free intraperitoneal air likely secondary to attempt at insufflation within the operating room.  Abnormal appearance to left hepatic lobe likely secondary to altered perfusion demonstrated on prior contrast-enhanced abdomen pelvic CT.  Hepatic steatosis.  Patient status post sigmoidectomy with left lower quadrant colostomy and parastomal hernia.  Critical Value/emergent results were called by telephone at the time of interpretation on 06/03/2015 at 4:30 pm to Dr. Marcello Moores, who verbally acknowledged these results.   Electronically Signed   By: Lovey Newcomer M.D.   On: 06/20/2015 17:11   Ct Head Wo Contrast  06/06/2015   CLINICAL DATA:  Anoxic injury  EXAM: CT HEAD WITHOUT CONTRAST  TECHNIQUE: Contiguous axial images were obtained from the base of the skull through the vertex without intravenous contrast.  COMPARISON:  None.  FINDINGS: Mild generalized atrophy.  Ventricle size normal.  Mild hypodensity in the cerebral white matter consistent with chronic microvascular ischemia. No acute infarct. No evidence of brain edema. No hemorrhage or mass.  Calvarium intact.  IMPRESSION: Mild chronic microvascular ischemic change.  No acute abnormality.   Electronically Signed   By: Franchot Gallo M.D.   On: 06/01/2015 16:06   Ct Chest Wo Contrast  06/22/2015   CLINICAL DATA:  Patient brought to the operating room for colostomy takedown. During instillation common patient developed bradycardia and hypoxia.  EXAM: CT CHEST, ABDOMEN AND PELVIS WITHOUT CONTRAST  TECHNIQUE: Multidetector CT imaging of the chest, abdomen and pelvis was performed following the standard protocol without IV contrast.  COMPARISON:  CT abdomen pelvis 08/10/2014.  FINDINGS: CT CHEST FINDINGS  Mediastinum/Nodes: ET tube terminates within the distal trachea. Enteric tube terminates within the stomach. No enlarged axillary,  mediastinal or hilar lymphadenopathy. Normal heart size. Coronary arterial vascular calcifications.  Lungs/Pleura: Central airways are patent. Dependent atelectasis within the bilateral lower lobes. Small bilateral pleural effusions. No definite pneumothorax.  Musculoskeletal: No aggressive or acute appearing osseous lesions.  CT ABDOMEN AND PELVIS FINDINGS  Hepatobiliary: Liver is diffusely low in attenuation compatible with hepatic steatosis. The liver is nodular in contour suggestive of cirrhosis. Small gallstone within the gallbladder lumen. Abnormal appearance to the left hepatic lobe adjacent to the falciform ligament likely corresponds with abnormal profusion of this location demonstrated prior contrast-enhanced CT.  Pancreas: Unremarkable  Spleen: Unremarkable  Adrenals/Urinary Tract: Normal adrenal glands. Stable bilateral low-attenuation renal lesions. Unchanged 7 mm nonobstructing stone inferior pole left kidney. Urinary bladder decompressed with Foley catheter.  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 No abnormal bowel wall thickening or evidence for bowel obstruction.  Vascular/Lymphatic: Normal caliber abdominal aorta. No retroperitoneal adenopathy.  Other: High attenuation blood products are demonstrated coursing around the liver, along the right paracolic gutter and into the pelvis. Moderate volume hemoperitoneum. There are multiple foci of free intraperitoneal air. Additionally there is a fat containing ventral abdominal wall hernia. Diastases of the rectus abdominus musculature.  Musculoskeletal: No aggressive or acute appearing osseous lesions. Lower lumbar spine degenerative changes.  IMPRESSION: Moderate volume hemoperitoneum coursing from the perihepatic location along the right paracolic gutter and into the pelvis. Definitive etiology is not identified on noncontrast examination.  Consider correlation with post contrast enhanced imaging as clinically appropriate. Multiple foci free intraperitoneal air likely secondary to attempt at insufflation within the operating room.  Abnormal appearance to left hepatic lobe likely secondary to altered perfusion demonstrated on prior contrast-enhanced abdomen pelvic CT.  Hepatic steatosis.  Patient status post sigmoidectomy with left lower quadrant colostomy and parastomal hernia.  Critical Value/emergent results were called by telephone at the time of interpretation on 06/02/2015 at 4:30 pm to Dr. Marcello Moores, who verbally acknowledged these results.   Electronically Signed   By: Lovey Newcomer M.D.   On: 06/15/2015 17:11   Dg Chest Port 1 View  06/13/2015   CLINICAL DATA:  Anoxia. History of hypertension, CHF, heart murmur, bronchitis.  EXAM: PORTABLE CHEST - 1 VIEW  COMPARISON:  05/31/2015  FINDINGS: Endotracheal tube is in place with tip 3.3 cm above carina. Nasogastric tube tip is beyond the image and beyond the level of the mid stomach.  Heart size is mildly enlarged. There is increased bilateral lower lobe opacity, obscuring the hemidiaphragms bilaterally. Bilateral pleural effusions are present.  IMPRESSION: 1. Interval development of bilateral lower lobe infiltrates, right greater than left. 2. Bilateral pleural effusions.   Electronically Signed   By: Nolon Nations M.D.   On: 06/13/2015 07:27   Dg Chest Port 1 View  05/29/2015   CLINICAL DATA:  Acute cardiopulmonary arrest during surgical procedure. Intubation.  EXAM: PORTABLE CHEST - 1 VIEW  COMPARISON:  07/29/2014  FINDINGS: Endotracheal tube is seen with tip approximately 1.5 cm above the carina. Nasogastric tube is seen with tip in the distal stomach.  Low lung volumes are noted however both lungs are clear. Heart size is within normal limits allowing for low lung volumes. No evidence pneumothorax or pleural effusion.  IMPRESSION: Low lung volumes. No acute findings. Endotracheal tube and  nasogastric tube in appropriate position.   Electronically Signed   By: Earle Gell M.D.   On: 06/11/2015 13:40    Anti-infectives: Anti-infectives    Start     Dose/Rate Route Frequency Ordered Stop   06/26/2015 0949  cefoTEtan (CEFOTAN) 2 g in dextrose 5 % 50 mL IVPB     2 g 100 mL/hr over 30 Minutes Intravenous On call to O.R. 06/11/2015 0949 06/14/2015 1209      Assessment/Plan: s/p Procedure(s): ATTEMPTED LAPAROSCOPIC COLOSTOMY TAKEDOWN Intraoperative bradycardia and prolonged hypoxia. Apparent anoxic brain injury with seizure activity Hemodynamics stable, no apparent active bleeding Daughter aware of situation, pt is DNR   LOS: 1 day    Seri Kimmer T 06/13/2015

## 2015-06-13 NOTE — Plan of Care (Signed)
Problem: Phase I Progression Outcomes Goal: VTE prophylaxis Outcome: Completed/Met Date Met:  06/13/15 SCDs Goal: Oral Care per Protocol Outcome: Completed/Met Date Met:  06/13/15 High risk oral care protocol ordered.

## 2015-06-14 LAB — BASIC METABOLIC PANEL
ANION GAP: 3 — AB (ref 5–15)
BUN: 8 mg/dL (ref 6–20)
CHLORIDE: 112 mmol/L — AB (ref 101–111)
CO2: 24 mmol/L (ref 22–32)
Calcium: 7.6 mg/dL — ABNORMAL LOW (ref 8.9–10.3)
Creatinine, Ser: 0.54 mg/dL (ref 0.44–1.00)
GFR calc Af Amer: >60 mL/min (ref >60)
GFR calc non Af Amer: >60 mL/min (ref >60)
Glucose, Bld: 149 mg/dL — ABNORMAL HIGH (ref 65–99)
Potassium: 4 mmol/L (ref 3.5–5.1)
Sodium: 139 mmol/L (ref 135–145)

## 2015-06-14 LAB — CBC
HCT: 33.4 % — ABNORMAL LOW (ref 36.0–46.0)
Hemoglobin: 10.4 g/dL — ABNORMAL LOW (ref 12.0–15.0)
MCH: 32.4 pg (ref 26.0–34.0)
MCHC: 31.1 g/dL (ref 30.0–36.0)
MCV: 104 fL — AB (ref 78.0–100.0)
Platelets: 159 10*3/uL (ref 150–400)
RBC: 3.21 MIL/uL — ABNORMAL LOW (ref 3.87–5.11)
RDW: 14.8 % (ref 11.5–15.5)
WBC: 11.9 10*3/uL — ABNORMAL HIGH (ref 4.0–10.5)

## 2015-06-14 LAB — GLUCOSE, CAPILLARY: Glucose-Capillary: 142 mg/dL — ABNORMAL HIGH (ref 65–99)

## 2015-06-14 MED ORDER — SCOPOLAMINE 1 MG/3DAYS TD PT72
1.0000 | MEDICATED_PATCH | TRANSDERMAL | Status: DC
  Administered 2015-06-14: 1.5 mg via TRANSDERMAL
  Filled 2015-06-14: qty 1

## 2015-06-14 MED ORDER — FENTANYL BOLUS VIA INFUSION
50.0000 ug | INTRAVENOUS | Status: DC | PRN
  Administered 2015-06-14 (×3): 100 ug via INTRAVENOUS
  Filled 2015-06-14: qty 100

## 2015-06-14 MED ORDER — ATROPINE SULFATE 1 % OP SOLN
2.0000 [drp] | Freq: Four times a day (QID) | OPHTHALMIC | Status: DC | PRN
  Administered 2015-06-14 – 2015-06-15 (×3): 2 [drp] via SUBLINGUAL
  Filled 2015-06-14: qty 2

## 2015-06-14 MED ORDER — LORAZEPAM 2 MG/ML IJ SOLN
5.0000 mg/h | INTRAMUSCULAR | Status: DC
  Administered 2015-06-14 – 2015-06-15 (×4): 5 mg/h via INTRAVENOUS
  Filled 2015-06-14 (×7): qty 25

## 2015-06-14 MED ORDER — LORAZEPAM BOLUS VIA INFUSION
2.0000 mg | INTRAVENOUS | Status: DC | PRN
  Administered 2015-06-14: 2 mg via INTRAVENOUS
  Administered 2015-06-14 (×6): 5 mg via INTRAVENOUS
  Administered 2015-06-14 (×2): 2 mg via INTRAVENOUS
  Administered 2015-06-15 (×7): 5 mg via INTRAVENOUS
  Filled 2015-06-14 (×17): qty 5

## 2015-06-14 NOTE — Progress Notes (Signed)
Propofol slowly decreased throughout the day and turned off at 14:10. 14:13 patient had seizure lasting 2 minutes.

## 2015-06-14 NOTE — Progress Notes (Signed)
Pt extubated per withdrawal protocol guidelines.

## 2015-06-14 NOTE — Progress Notes (Signed)
PULMONARY / CRITICAL CARE MEDICINE   Name: Brianna Duncan MRN: [id number] DOB: December 26, 1945    ADMISSION DATE:  06/28/2015 CONSULTATION DATE:  7/15  [facility]  CHIEF COMPLAINT: Post bradycardic event  INITIAL PRESENTATION: For colostomy takedown  STUDIES:  7/15 2d>> 7/15 ct head>> no m/s/b 7/15 ct chest>>atelectasis 7/15 ABD>> Moderate volume hemoperitoneum   SIGNIFICANT EVENTS: 7/15 NEAR CARDIAC ARREST - intra op severe bradycardia and ETCO2 in low single digits and hypoxemia. Resolved over 15 Minutes  7/15 GTC seizures , 4-5 episodes - loaded with keppra. Versed , neuro consulted   HISTORY OF PRESENT ILLNESS:   69 yo female to OR for colostomy takedown 7/15 , tolerated induction but when insufflation started via trocar per surgery she developed immediate bradycardia, hypoxia , mottled halfway up and case was stopped. She did require chest compression but did get atropine. Transported to ICU and PCCM asked to assist in her care. She is on 100% fio2 with sats 96%.    SUBJECTIVE:  Remains sedated on propofol and intubated. Family at bedside. Last seizure was last evening around 6 PM.  VITAL SIGNS: Temp:  [98.7 F (37.1 C)-99.5 F (37.5 C)] 98.8 F (37.1 C) (07/17 0420) Pulse Rate:  [80-110] 94 (07/17 0900) Resp:  [15-26] 17 (07/17 0900) BP: (105-118)/(50-69) 118/51 mmHg (07/17 0855) SpO2:  [95 %-99 %] 97 % (07/17 0900) Arterial Line BP: (85-128)/(43-58) 122/55 mmHg (07/17 0900) FiO2 (%):  [80 %-100 %] 80 % (07/17 0827) Weight:  [180 lb 1.9 oz (81.7 kg)] 180 lb 1.9 oz (81.7 kg) (07/17 0200) HEMODYNAMICS:   VENTILATOR SETTINGS: Vent Mode:  [-] PRVC FiO2 (%):  [80 %-100 %] 80 % Set Rate:  [20 bmp] 20 bmp Vt Set:  [350 mL] 350 mL PEEP:  [5 cmH20] 5 cmH20 Plateau Pressure:  [12 cmH20-14 cmH20] 12 cmH20 INTAKE / OUTPUT:  Intake/Output Summary (Last 24 hours) at 06/14/15 0929 Last data filed at 06/14/15 0900  Gross per 24 hour  Intake 3288.51 ml  Output   1145 ml   Net 2143.51 ml    PHYSICAL EXAMINATION: General:  Sedated. No acute distress. Family at bedside.  Integument:  Warm & dry. No rash on exposed skin.  HEENT:  o scleral injection or icterus. Endotracheal tube in place.  Cardiovascular:  Regular rate. No appreciable JVD.  Pulmonary:  Good aeration & clear to auscultation bilaterally on the ventilator. Symmetric chest wall expansion.  Abdomen: Soft. Normal bowel sounds. Ostomy in place. Neurological:  Sedated. No spontaneous movements. PERRL.   LABS:  CBC  Recent Labs Lab 06/13/15 0256 06/13/15 1200 06/14/15 0550  WBC 22.0* 15.4* 11.9*  HGB 12.1 11.1* 10.4*  HCT 38.9 35.9* 33.4*  PLT 251 205 159   Coag's  Recent Labs Lab 06/25/2015 1311 06/15/2015 1812 06/13/15 0256  APTT 34  --   --   INR 1.17 1.09 1.07   BMET  Recent Labs Lab 06/10/15 1345 06/13/15 0256 06/14/15 0550  NA 135 137 139  K 4.0 4.5 4.0  CL 102 109 112*  CO2 23 22 24   BUN 16 11 8   CREATININE 0.74 0.95 0.54  GLUCOSE 133* 254* 149*   Electrolytes  Recent Labs Lab 06/10/15 1345 06/13/15 0256 06/14/15 0550  CALCIUM 9.7 7.5* 7.6*  MG  --  1.7  --   PHOS  --  3.7  --    Sepsis Markers  Recent Labs Lab 06/05/2015 1313  LATICACIDVEN 1.4   ABG  Recent Labs Lab 06/20/2015 1315 06/14/2015  1416 06/05/2015 1639  PHART 7.183* 7.206* 7.337*  PCO2ART 63.6* 59.0* 37.4  PO2ART 87.4 158* 111*   Liver Enzymes  Recent Labs Lab 06/10/15 1345 06/13/15 0256  AST 122* 100*  ALT 105* 81*  ALKPHOS 147* 90  BILITOT 1.2 0.4  ALBUMIN 4.6 3.0*   Cardiac Enzymes  Recent Labs Lab 06/14/2015 1839 06/13/15 0203  TROPONINI 0.13* 0.14*   Glucose  Recent Labs Lab 06/14/15 0014  GLUCAP 142*    Imaging No results found.   ASSESSMENT / PLAN:  PULMONARY OETT 7/15>> A: Post near arrest, severe hypoxia, ? PE vs Air embolism   P:   Vent bundle Continue weaning FiO2 for saturations greater than 94%   CARDIOVASCULAR Left Radial Art Line  >> A:  Post bradycardiac near arrest - no regional wall motion abnormalities on echo 07/2014 EF 40%   P:  Cards consulted Hold antihypertensives No pressors  RENAL A:   Decreasing urine output  P:   Monitor urine output Daily electrolytes & BUN/creatinine  GASTROINTESTINAL A:  Colitis with colostomy  P:   Per [facility]  HEMATOLOGIC A:   Leucocytosis Anemia  P:  Continue to monitor daily hemoglobin & WBC   INFECTIOUS A:  No infectious process noted  P:   Trending leukocytosis Monitoring for fever  ENDOCRINE A:   No acute issue   P:  Monitor blood glucose daily w/ electrolytes  NEUROLOGIC A:   Anoxic brain injury with encephalopathy Seizures  P:   RASS goal: 0 Propofol gtt - wean as tolerated today for seizures Keppra Finishing Load of Depakote Neuro following   FAMILY  - Updates: daughter Lelon Frohlich and siblings this morning (7/17) regarding plan of care.  - Inter-disciplinary family meet or Palliative Care meeting due by:  day 7    TODAY'S SUMMARY: Concern is for air embolism.  Also concern for anoxic encephalopathy.  Daughter wishes to wean propofol drip to determine whether or not seizures can be controlled. She is interested in potential disposition to home with hospice. Awaiting further neurology recommendations today.  I have spent a total of 40 minutes of critical care time today caring for this patient and discussing the plan of care with the patient's family members and daughter at bedside.  Sonia Baller Ashok Cordia, M.D. [facility] Pager:  [phone] After 3pm or if no response, call [phone]  06/14/2015, 9:29 AM

## 2015-06-14 NOTE — Progress Notes (Signed)
Family Discussion Note: After weaning the patient's propofol to off the patient suffered another seizure. I spoke with patient's daughter at bedside and she wishes to change to full comfort measures and terminal wean and extubation. We will ensure the patient remained comfortable during this process. We will continue her fentanyl drip with when necessary boluses of fentanyl. Starting Ativan drip for seizure prevention with bolus IV Ativan as needed. Family at her bedside.

## 2015-06-14 NOTE — Progress Notes (Signed)
Family denies need for chaplain support at this time.

## 2015-06-14 NOTE — Progress Notes (Signed)
Subjective: No further seizures since last night.   Exam: Filed Vitals:   06/14/15 1000  BP:   Pulse: 86  Temp:   Resp: 16   Gen: In bed, NAD Resp: Ventilated  Abd: soft, nt  Neuro: MS: Does not open eyes, does not follow commands CN: Eyes midline, pupils reactive bilaterally, blinks to eyelid  stimulation bilaterally Motor: minimal flexion to noxious stimulation in all 4 extremities.  Sensory:as above  No further clinical activity as was seen yesterday.   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 She no longer has clinical activity, though subclinical activity difficult to rule out. She discussed with Dr. Aram Beecham being more aggressive with continuous EEG and induced coma and had decided against a very aggressive route. She was in subtle clinical status epilepticus for at least 24 hours. With refractory post-anoxic seizures, I think that the chance of her having a good recovery is relatively low, though difficult to be certain at this time. .   Recommendations: 1) continue depakote 2) continue keppra  3) EEG, will likely be tomorrow.   Roland Rack, MD [facility] [phone]  If 7pm- 7am, please page neurology on call as listed in [place].

## 2015-06-14 NOTE — Progress Notes (Signed)
Patient ID: [name], female   DOB: [birth year], 69 y.o.   MRN: [id number] 2 Days Post-Op  Subjective: Unresponsive on vent.  N o seizures noted overnight with increased meds  Objective: Vital signs in last 24 hours: Temp:  [98.7 F (37.1 C)-99.5 F (37.5 C)] 98.8 F (37.1 C) (07/17 0420) Pulse Rate:  [80-110] 87 (07/17 0420) Resp:  [15-26] 26 (07/17 0420) BP: (105-115)/(50-69) 115/69 mmHg (07/17 0420) SpO2:  [95 %-99 %] 99 % (07/17 0420) Arterial Line BP: (85-112)/(43-53) 112/53 mmHg (07/17 0200) FiO2 (%):  [80 %-100 %] 80 % (07/17 0420) Last BM Date: 06/13/15  Intake/Output from previous day: 07/16 0701 - 07/17 0700 In: 2644.5 [I.V.:2154.5; NG/GT:90; IV Piggyback:180] Out: 655 [Urine:655] Intake/Output this shift:    General appearance: On vent and not responsive GI: Soft, non distended, colostomy functioning Neurologic: No response to stimuli.  Pupils pinpoint.  No nysagmus or seizure activity  Lab Results:   Recent Labs  06/13/15 1200 06/14/15 0550  WBC 15.4* 11.9*  HGB 11.1* 10.4*  HCT 35.9* 33.4*  PLT 205 159   BMET  Recent Labs  06/13/15 0256 06/14/15 0550  NA 137 139  K 4.5 4.0  CL 109 112*  CO2 22 24  GLUCOSE 254* 149*  BUN 11 8  CREATININE 0.95 0.54  CALCIUM 7.5* 7.6*     Studies/Results: Ct Abdomen Pelvis Wo Contrast  06/28/2015   CLINICAL DATA:  Patient brought to the operating room for colostomy takedown. During instillation common patient developed bradycardia and hypoxia.  EXAM: CT CHEST, ABDOMEN AND PELVIS WITHOUT CONTRAST  TECHNIQUE: Multidetector CT imaging of the chest, abdomen and pelvis was performed following the standard protocol without IV contrast.  COMPARISON:  CT abdomen pelvis 08/10/2014.  FINDINGS: CT CHEST FINDINGS  Mediastinum/Nodes: ET tube terminates within the distal trachea. Enteric tube terminates within the stomach. No enlarged axillary, mediastinal or hilar lymphadenopathy. Normal heart size. Coronary arterial  vascular calcifications.  Lungs/Pleura: Central airways are patent. Dependent atelectasis within the bilateral lower lobes. Small bilateral pleural effusions. No definite pneumothorax.  Musculoskeletal: No aggressive or acute appearing osseous lesions.  CT ABDOMEN AND PELVIS FINDINGS  Hepatobiliary: Liver is diffusely low in attenuation compatible with hepatic steatosis. The liver is nodular in contour suggestive of cirrhosis. Small gallstone within the gallbladder lumen. Abnormal appearance to the left hepatic lobe adjacent to the falciform ligament likely corresponds with abnormal profusion of this location demonstrated prior contrast-enhanced CT.  Pancreas: Unremarkable  Spleen: Unremarkable  Adrenals/Urinary Tract: Normal adrenal glands. Stable bilateral low-attenuation renal lesions. Unchanged 7 mm nonobstructing stone inferior pole left kidney. Urinary bladder decompressed with Foley catheter.  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 No abnormal bowel wall thickening or evidence for bowel obstruction.  Vascular/Lymphatic: Normal caliber abdominal aorta. No retroperitoneal adenopathy.  Other: High attenuation blood products are demonstrated coursing around the liver, along the right paracolic gutter and into the pelvis. Moderate volume hemoperitoneum. There are multiple foci of free intraperitoneal air. Additionally there is a fat containing ventral abdominal wall hernia. Diastases of the rectus abdominus musculature.  Musculoskeletal: No aggressive or acute appearing osseous lesions. Lower lumbar spine degenerative changes.  IMPRESSION: Moderate volume hemoperitoneum coursing from the perihepatic location along the right paracolic gutter and into the pelvis. Definitive etiology is not identified on noncontrast examination. Consider correlation with post contrast enhanced imaging as clinically  appropriate. Multiple foci free intraperitoneal air likely secondary to attempt  at insufflation within the operating room.  Abnormal appearance to left hepatic lobe likely secondary to altered perfusion demonstrated on prior contrast-enhanced abdomen pelvic CT.  Hepatic steatosis.  Patient status post sigmoidectomy with left lower quadrant colostomy and parastomal hernia.  Critical Value/emergent results were called by telephone at the time of interpretation on 06/20/2015 at 4:30 pm to Dr. Marcello Moores, who verbally acknowledged these results.   Electronically Signed   By: Lovey Newcomer M.D.   On: 06/25/2015 17:11   Ct Head Wo Contrast  06/03/2015   CLINICAL DATA:  Anoxic injury  EXAM: CT HEAD WITHOUT CONTRAST  TECHNIQUE: Contiguous axial images were obtained from the base of the skull through the vertex without intravenous contrast.  COMPARISON:  None.  FINDINGS: Mild generalized atrophy.  Ventricle size normal.  Mild hypodensity in the cerebral white matter consistent with chronic microvascular ischemia. No acute infarct. No evidence of brain edema. No hemorrhage or mass.  Calvarium intact.  IMPRESSION: Mild chronic microvascular ischemic change.  No acute abnormality.   Electronically Signed   By: Franchot Gallo M.D.   On: 06/11/2015 16:06   Ct Chest Wo Contrast  06/28/2015   CLINICAL DATA:  Patient brought to the operating room for colostomy takedown. During instillation common patient developed bradycardia and hypoxia.  EXAM: CT CHEST, ABDOMEN AND PELVIS WITHOUT CONTRAST  TECHNIQUE: Multidetector CT imaging of the chest, abdomen and pelvis was performed following the standard protocol without IV contrast.  COMPARISON:  CT abdomen pelvis 08/10/2014.  FINDINGS: CT CHEST FINDINGS  Mediastinum/Nodes: ET tube terminates within the distal trachea. Enteric tube terminates within the stomach. No enlarged axillary, mediastinal or hilar lymphadenopathy. Normal heart size. Coronary arterial vascular calcifications.   Lungs/Pleura: Central airways are patent. Dependent atelectasis within the bilateral lower lobes. Small bilateral pleural effusions. No definite pneumothorax.  Musculoskeletal: No aggressive or acute appearing osseous lesions.  CT ABDOMEN AND PELVIS FINDINGS  Hepatobiliary: Liver is diffusely low in attenuation compatible with hepatic steatosis. The liver is nodular in contour suggestive of cirrhosis. Small gallstone within the gallbladder lumen. Abnormal appearance to the left hepatic lobe adjacent to the falciform ligament likely corresponds with abnormal profusion of this location demonstrated prior contrast-enhanced CT.  Pancreas: Unremarkable  Spleen: Unremarkable  Adrenals/Urinary Tract: Normal adrenal glands. Stable bilateral low-attenuation renal lesions. Unchanged 7 mm nonobstructing stone inferior pole left kidney. Urinary bladder decompressed with Foley catheter.  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 No abnormal bowel wall thickening or evidence for bowel obstruction.  Vascular/Lymphatic: Normal caliber abdominal aorta. No retroperitoneal adenopathy.  Other: High attenuation blood products are demonstrated coursing around the liver, along the right paracolic gutter and into the pelvis. Moderate volume hemoperitoneum. There are multiple foci of free intraperitoneal air. Additionally there is a fat containing ventral abdominal wall hernia. Diastases of the rectus abdominus musculature.  Musculoskeletal: No aggressive or acute appearing osseous lesions. Lower lumbar spine degenerative changes.  IMPRESSION: Moderate volume hemoperitoneum coursing from the perihepatic location along the right paracolic gutter and into the pelvis. Definitive etiology is not identified on noncontrast examination. Consider correlation with post contrast enhanced imaging as clinically appropriate. Multiple foci free  intraperitoneal air likely secondary to attempt at insufflation within the operating room.  Abnormal appearance to left hepatic lobe likely secondary to altered perfusion demonstrated on prior contrast-enhanced abdomen pelvic CT.  Hepatic steatosis.  Patient status post sigmoidectomy with left lower quadrant colostomy and parastomal hernia.  Critical Value/emergent results were called by telephone at the time of interpretation on 06/05/2015 at 4:30 pm to Dr. Marcello Moores, who verbally acknowledged these results.   Electronically Signed   By: Lovey Newcomer M.D.   On: 06/13/2015 17:11   Dg Chest Port 1 View  06/13/2015   CLINICAL DATA:  Anoxia. History of hypertension, CHF, heart murmur, bronchitis.  EXAM: PORTABLE CHEST - 1 VIEW  COMPARISON:  06/05/2015  FINDINGS: Endotracheal tube is in place with tip 3.3 cm above carina. Nasogastric tube tip is beyond the image and beyond the level of the mid stomach.  Heart size is mildly enlarged. There is increased bilateral lower lobe opacity, obscuring the hemidiaphragms bilaterally. Bilateral pleural effusions are present.  IMPRESSION: 1. Interval development of bilateral lower lobe infiltrates, right greater than left. 2. Bilateral pleural effusions.   Electronically Signed   By: Nolon Nations M.D.   On: 06/13/2015 07:27   Dg Chest Port 1 View  06/15/2015   CLINICAL DATA:  Acute cardiopulmonary arrest during surgical procedure. Intubation.  EXAM: PORTABLE CHEST - 1 VIEW  COMPARISON:  07/29/2014  FINDINGS: Endotracheal tube is seen with tip approximately 1.5 cm above the carina. Nasogastric tube is seen with tip in the distal stomach.  Low lung volumes are noted however both lungs are clear. Heart size is within normal limits allowing for low lung volumes. No evidence pneumothorax or pleural effusion.  IMPRESSION: Low lung volumes. No acute findings. Endotracheal tube and nasogastric tube in appropriate position.   Electronically Signed   By: Earle Gell M.D.   On: 06/15/2015  13:40    Anti-infectives: Anti-infectives    Start     Dose/Rate Route Frequency Ordered Stop   06/06/2015 0949  cefoTEtan (CEFOTAN) 2 g in dextrose 5 % 50 mL IVPB     2 g 100 mL/hr over 30 Minutes Intravenous On call to O.R. 06/11/2015 0949 06/11/2015 1209      Assessment/Plan: s/p Procedure(s): ATTEMPTED LAPAROSCOPIC COLOSTOMY TAKEDOWN Bradycardia and prolonged anoxia Chance of improvement very slim.  Neurology following.  Possible withdrawal of care today.   LOS: 2 days    Quindon Denker T 06/14/2015

## 2015-06-15 DIAGNOSIS — K117 Disturbances of salivary secretion: Secondary | ICD-10-CM | POA: Insufficient documentation

## 2015-06-15 DIAGNOSIS — R0600 Dyspnea, unspecified: Secondary | ICD-10-CM

## 2015-06-15 DIAGNOSIS — Z515 Encounter for palliative care: Secondary | ICD-10-CM

## 2015-06-15 LAB — BLOOD GAS, ARTERIAL
Acid-base deficit: 6 mmol/L — ABNORMAL HIGH (ref 0.0–2.0)
Bicarbonate: 22.5 mEq/L (ref 20.0–24.0)
Drawn by: 307971
FIO2: 1 %
LHR: 16 {breaths}/min
O2 Saturation: 97.7 %
PEEP: 5 cmH2O
Patient temperature: 98.6
TCO2: 20.9 mmol/L (ref 0–100)
VT: 350 mL
pCO2 arterial: 59 mmHg (ref 35.0–45.0)
pH, Arterial: 7.206 — ABNORMAL LOW (ref 7.350–7.450)
pO2, Arterial: 158 mmHg — ABNORMAL HIGH (ref 80.0–100.0)

## 2015-06-15 MED ORDER — POLYVINYL ALCOHOL 1.4 % OP SOLN
1.0000 [drp] | Freq: Four times a day (QID) | OPHTHALMIC | Status: DC | PRN
  Administered 2015-06-15 (×2): 1 [drp] via OPHTHALMIC
  Filled 2015-06-15: qty 15

## 2015-06-15 MED ORDER — SODIUM CHLORIDE 0.9 % IV SOLN
2.0000 mg/h | INTRAVENOUS | Status: DC
  Administered 2015-06-15: 2 mg/h via INTRAVENOUS
  Filled 2015-06-15 (×2): qty 5

## 2015-06-15 MED ORDER — SODIUM CHLORIDE 0.9 % IJ SOLN: 3.0000 mL | INTRAMUSCULAR | Status: DC | PRN

## 2015-06-15 MED ORDER — SODIUM CHLORIDE 0.9 % IV SOLN: 250.0000 mL | INTRAVENOUS | Status: DC | PRN

## 2015-06-15 MED ORDER — HYDROMORPHONE BOLUS VIA INFUSION
1.0000 mg | INTRAVENOUS | Status: DC | PRN
  Administered 2015-06-15: 1 mg via INTRAVENOUS
  Filled 2015-06-15 (×2): qty 1

## 2015-06-15 MED ORDER — SCOPOLAMINE 1 MG/3DAYS TD PT72
1.0000 | MEDICATED_PATCH | TRANSDERMAL | Status: DC
  Administered 2015-06-15: 1.5 mg via TRANSDERMAL
  Filled 2015-06-15: qty 1

## 2015-06-15 MED ORDER — VITAMINS A & D EX OINT
TOPICAL_OINTMENT | CUTANEOUS | Status: AC
  Administered 2015-06-15: 03:00:00
  Filled 2015-06-15: qty 5

## 2015-06-15 MED ORDER — SODIUM CHLORIDE 0.9 % IJ SOLN
3.0000 mL | Freq: Two times a day (BID) | INTRAMUSCULAR | Status: DC
  Administered 2015-06-15 (×2): 3 mL via INTRAVENOUS

## 2015-06-15 MED ORDER — ATROPINE SULFATE 1 % OP SOLN
4.0000 [drp] | OPHTHALMIC | Status: DC | PRN
  Administered 2015-06-15 (×4): 4 [drp] via SUBLINGUAL
  Filled 2015-06-15: qty 2

## 2015-06-15 NOTE — Progress Notes (Signed)
Patient ID: Brianna Duncan, female   DOB: 1946/04/20, 69 y.o.   MRN: [id number] 3 Days Post-Op  Subjective: Unresponsive on vent.  Placed on comfort care measures after pt had another seizure, while propofol being weaned.  Palliative care involved.    Objective: Vital signs in last 24 hours: Temp:  [101.1 F (38.4 C)] 101.1 F (38.4 C) (07/17 2000) Pulse Rate:  [95-123] 109 (07/18 1200) Resp:  [13-32] 13 (07/18 1200) BP: (82-129)/(45-64) 82/45 mmHg (07/18 1200) SpO2:  [38 %-97 %] 70 % (07/18 1200) Arterial Line BP: (102-210)/(55-187) 128/58 mmHg (07/18 0600) Last BM Date: 06/14/15  Intake/Output from previous day: 07/17 0701 - 07/18 0700 In: 3324.9 [I.V.:2899.9; IV Piggyback:385] Out: 7591 [Urine:1445; Emesis/NG output:100] Intake/Output this shift: Total I/O In: -  Out: 350 [Urine:350]  General appearance: On vent and not responsive GI: Soft, non distended, colostomy functioning Neurologic: No response to stimuli.  Pupils pinpoint.  No nysagmus or seizure activity  Lab Results:   Recent Labs  06/13/15 1200 06/14/15 0550  WBC 15.4* 11.9*  HGB 11.1* 10.4*  HCT 35.9* 33.4*  PLT 205 159   BMET  Recent Labs  06/13/15 0256 06/14/15 0550  NA 137 139  K 4.5 4.0  CL 109 112*  CO2 22 24  GLUCOSE 254* 149*  BUN 11 8  CREATININE 0.95 0.54  CALCIUM 7.5* 7.6*     Studies/Results: No results found.  Anti-infectives: Anti-infectives    Start     Dose/Rate Route Frequency Ordered Stop   06/08/2015 0949  cefoTEtan (CEFOTAN) 2 g in dextrose 5 % 50 mL IVPB     2 g 100 mL/hr over 30 Minutes Intravenous On call to O.R. 06/20/2015 0949 06/18/2015 1209      Assessment/Plan: s/p Procedure(s): ATTEMPTED LAPAROSCOPIC COLOSTOMY TAKEDOWN Pt on comfort care.  Will be available as needed.   LOS: 3 days    Tane Biegler C. [id number]

## 2015-06-15 NOTE — Consult Note (Signed)
Consultation Note Date: 06/15/2015   Patient Name: Brianna Duncan  DOB: Aug 01, 1946  MRN: [id number]  Age / Sex: 69 y.o., female   PCP: Wendie Agreste, MD Referring Physician: Leighton Ruff, MD  Reason for Consultation: Establishing goals of care Life limiting illness: S/P cardiopulmonary arrest during surgical procedure for colostomy takedown.  Palliative Care Assessment and Plan Summary of Established Goals of Care and Medical Treatment Preferences  69 yo female to OR for colostomy takedown 7/15 , tolerated induction but when insufflation started via trocar per surgery she developed immediate bradycardia, hypoxia , mottled halfway up and case was stopped. She did require chest compression but did get atropine. Transported to ICU and PCCM was involved in her care. ICU hospitalization course complicated by seizures, anoxic brain injury and pneumo peritoneum. 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   The patient was compassionately extubated to comfort care by the pulm/CCM team on 06-14-15.  A palliative consult has been placed for end of life care management and for supportive care for family.   Discussed with sister and bedside RN. Patient is unresponsive, she is with loud noisy breathing. RN reports she has deep suctioned few times today. Discussed use of Atropine and scopolamine patch. Discussed no further benefit in suctioning. Discussed augmentation of fentanyl and ativan for comfort measures. Discussed loud noisy breathing and terminal secretions in the context of critical terminal illness with bedside RN and sister. Daughter has gone home to get some rest, she will be back at 1500 today.   PLAN: 1. DNR DNI comfort measures at end of life discussed and re confirmed with sister holding vigil at the bedside in the room. Sister is tearful but in full understanding of current situation, " I wouldn't want her to lay like this and suffer." 2. Change  to Dilaudid Infusion and monitor 3. Atropine SL drops and Scopolamine patch. Education about terminal secretions at end of life given. [place] Respiratory Congestion Scale 3.  4. Continue Ativan infusion.  5. End of life signs and symptoms discussed with sister. Prognosis of likely final 24 hours discussed with sister.     Contacts/Participants in Discussion: Primary Decision Maker:  Daughter Designer, jewellery at [phone]    HCPOA: yes     Code Status/Advance Care Planning:  DNR DNI comfort care  Symptom Management:   Dilaudid infusion, Ativan infusion, add ativan and scopolamine patch for secretions.  D/C IVF  Palliative Prophylaxis: yes  Additional Recommendations (Limitations, Scope, Preferences):  Anticipate hospital death in hours. Discussed with sister present in the room.  Psycho-social/Spiritual:   Support System: strong, sister and daughter   Desire for further Chaplaincy support:no  Prognosis: hours  Discharge Planning:  hospital death   Domains of Care: - Physical: unresponsive but with loud noisy breathing - Psychological: unresponsive  - Social: lives at home with sister and daughter in [place], [place] - Spiritual: no acute issues, spiritual care has been offered. - Cultural: as above - Imminently dying: yes. Prognosis few hours to final 24 hours discussed with sister in the room - Ethical/Legal: DNR DNI comfort care, daughter is decision maker, sister tells me there is no spouse.   Values: comfort care at end of life Life limiting illness: s/p arrest, hemoperitoneum, anoxic brain injury.       Chief Complaint/History of Present Illness: presented for colostomy reversal on 06/11/2015  Primary Diagnoses  Present on Admission:  . Bradycardia  Palliative Review of Systems: Noted  I  have reviewed the medical record, interviewed the patient and family, and examined the patient. The following aspects are pertinent.  Past Medical History  Diagnosis  Date  . Hypertension   . Allergy     seasonal  . Anxiety   . Osteoporosis     Was on   . History of kidney stones   . On hormone replacement therapy     Patch  . Depression   . H. pylori infection     02-09-2015 egd   . Colostomy in place   . Acute colitis     [facility]-Dr Donne Hazel   . CHF (congestive heart failure)     resolved with no medicines currently   . GERD (gastroesophageal reflux disease)   . 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 Bronchitis     hx of   . Chronic kidney disease     kidney stones; pt states currently has 2 stones  . Arthritis   . Cirrhosis of liver    History   Social History  . Marital Status: Divorced    Spouse Name: N/A  . Number of Children: 1  . Years of Education: N/A   Occupational History  . retired     retired   Social History Main Topics  . Smoking status: Former Smoker -- 0.75 packs/day for 44 years    Types: Cigarettes    Quit date: 11/29/1999  . Smokeless tobacco: Never Used     Comment: quit 15  yrs ago  . Alcohol Use: 0.6 oz/week    1 Glasses of wine per week     Comment: wine with dinner and socially  . Drug Use: No  . Sexual Activity: Yes    Birth Control/ Protection: None   Other Topics Concern  . None   Social History Narrative   Patient lives at home with her sister and she is retired.   Education some college.   Right handed.   Caffeine three cups of coffee daily.   Family History  Problem Relation Age of Onset  . Heart disease Mother   . Stroke Father   . Stroke Sister   . Heart disease Brother   . Colon cancer Neg Hx   . Rectal cancer Neg Hx   . Stomach cancer Neg Hx   . Esophageal cancer Neg Hx    Scheduled Meds: . antiseptic oral rinse  7 mL Mouth Rinse QID  . artificial tears   Both Eyes 3 times per day  . levETIRAcetam  1,500 mg Intravenous Q12H  . scopolamine  1 patch Transdermal Q72H  . sodium chloride  3 mL Intravenous Q12H  . valproate sodium  500 mg Intravenous Q8H     Continuous Infusions: . fentaNYL infusion INTRAVENOUS 250 mcg/hr (06/15/15 0429)  . HYDROmorphone    . LORazepam (ATIVAN) infusion 5 mg/hr (06/15/15 0444)  . propofol (DIPRIVAN) infusion Stopped (06/14/15 1527)   PRN Meds:.sodium chloride, atropine, fentaNYL, HYDROmorphone, LORazepam, ondansetron, polyvinyl alcohol, sodium chloride Medications Prior to Admission:  Prior to Admission medications   Medication Sig Start Date End Date Taking? Authorizing Provider  ALPRAZolam (XANAX) 0.5 MG tablet TAKE 1 TABLET BY MOUTH TWICE A DAY AS NEEDED Patient taking differently: TAKE 1 TABLET BY MOUTH TWICE A DAY AS NEEDED FOR ANXIETY. 05/04/15  Yes Wendie Agreste, MD  amLODipine (NORVASC) 5 MG tablet Take 1 tablet (5 mg total) by mouth daily. 01/27/15  Yes Ranell Patrick  Carlota Raspberry, MD  aspirin 81 MG tablet Take 81 mg by mouth every morning.    Yes Historical Provider, MD  dimenhyDRINATE (DRAMAMINE) 50 MG tablet Take 100 mg by mouth every morning.   Yes Historical Provider, MD  estradiol (VIVELLE-DOT) 0.05 MG/24HR patch Place 1 patch onto the skin 2 (two) times a week.   Yes Historical Provider, MD  fluticasone (FLONASE) 50 MCG/ACT nasal spray Place 2 sprays into both nostrils daily as needed for allergies or rhinitis.   Yes Historical Provider, MD  lisinopril-hydrochlorothiazide (PRINZIDE,ZESTORETIC) 20-12.5 MG per tablet Take 1 tablet by mouth daily. 01/27/15  Yes Wendie Agreste, MD  Multiple Vitamin (MULTIVITAMIN) tablet Take 1 tablet by mouth every morning.    Yes Historical Provider, MD  omeprazole (PRILOSEC) 20 MG capsule Take 1 capsule (20 mg total) by mouth daily. 02/09/15  Yes Milus Banister, MD  Probiotic Product (PROBIOTIC DAILY PO) Take 1 tablet by mouth every morning.    Yes Historical Provider, MD  sertraline (ZOLOFT) 100 MG tablet Take 1 tablet (100 mg total) by mouth daily. 01/26/15  Yes Wendie Agreste, MD   Allergies  Allergen Reactions  . Ivp Dye [Iodinated Diagnostic Agents] Hives  .  Morphine And Related Other (See Comments)    Hallucinations   CBC:    Component Value Date/Time   WBC 11.9* 06/14/2015 0550   WBC 9.3 06/30/2014   WBC 8.9 04/11/2014 1427   HGB 10.4* 06/14/2015 0550   HGB 14.2 04/11/2014 1427   HCT 33.4* 06/14/2015 0550   HCT 44.3 04/11/2014 1427   PLT 159 06/14/2015 0550   MCV 104.0* 06/14/2015 0550   MCV 98.3* 04/11/2014 1427   NEUTROABS 8.6* 02/04/2015 1439   LYMPHSABS 2.1 02/04/2015 1439   MONOABS 1.1* 02/04/2015 1439   EOSABS 0.2 02/04/2015 1439   BASOSABS 0.1 02/04/2015 1439   Comprehensive Metabolic Panel:    Component Value Date/Time   NA 139 06/14/2015 0550   K 4.0 06/14/2015 0550   CL 112* 06/14/2015 0550   CO2 24 06/14/2015 0550   BUN 8 06/14/2015 0550   CREATININE 0.54 06/14/2015 0550   CREATININE 0.61 10/27/2014 1507   GLUCOSE 149* 06/14/2015 0550   CALCIUM 7.6* 06/14/2015 0550   AST 100* 06/13/2015 0256   ALT 81* 06/13/2015 0256   ALKPHOS 90 06/13/2015 0256   BILITOT 0.4 06/13/2015 0256   PROT 6.3* 06/13/2015 0256   ALBUMIN 3.0* 06/13/2015 0256    Physical Exam: Vital Signs: BP 118/51 mmHg  Pulse 111  Temp(Src) 101.1 F (38.4 C) (Oral)  Resp 20  Ht 4\' 11"  (1.499 m)  Wt 81.7 kg (180 lb 1.9 oz)  BMI 36.36 kg/m2  SpO2 56% SpO2: SpO2: (!) 56 % O2 Device: O2 Device: Nasal Cannula O2 Flow Rate: O2 Flow Rate (L/min): 2 L/min Intake/output summary:  Intake/Output Summary (Last 24 hours) at 06/15/15 1127 Last data filed at 06/15/15 0630  Gross per 24 hour  Intake 2343.75 ml  Output   1320 ml  Net 1023.75 ml   LBM: Last BM Date: 06/14/15 Baseline Weight: Weight: 75.921 kg (167 lb 6 oz) Most recent weight: Weight: 81.7 kg (180 lb 1.9 oz)  Exam Findings:  Unresponsive, actively dying Loud noisy breathing, some wincing grimacing, non verbal gestures of distress evident Coarse rhonchi anterior S1S2 muffled due to loud adventitious breath sounds, tachy on monitor.  Abdomen distended Edema bilateral UE and LE. No  acute coolness or mottling evident yet unresponsive  Palliative Performance Scale: 10%   Additional Data Reviewed: Recent Labs     06/13/15  0256  06/13/15  1200  06/14/15  0550  WBC  22.0*  15.4*  11.9*  HGB  12.1  11.1*  10.4*  PLT  251  205  159  NA  137   --   139  BUN  11   --   8  CREATININE  0.95   --   0.54     Time In: 10 00 Time Out: 11.30 Time Total: 90 minutes.  Greater than 50%  of this time was spent counseling and coordinating care related to the above assessment and plan.  Signed by: Loistine Chance, MD [place], MD  06/15/2015, 11:27 AM  Please contact Palliative Medicine Team phone at [phone] for questions and concerns.

## 2015-06-15 NOTE — Progress Notes (Signed)
Patient naso-tracheal suctioned for a large amount thick pale yellow secretions. No cough reflex. Will continue to monitor.

## 2015-06-15 NOTE — Progress Notes (Signed)
[facility]  Primary service: General Surgery  S: Chart reviewed, patient now actively dying O: Filed Vitals:   06/15/15 0300 06/15/15 0400 06/15/15 0500 06/15/15 0600  BP:      Pulse: 112 109 112 111  Temp:      TempSrc:      Resp: 21 21 22 20   Height:      Weight:      SpO2: 61% 62% 61% 56%   On 2L [place]  Gen: cyanotic, increased work of breathing HENT: NCAT PULM: loud upper airway rhonchi, increased work of breathing CV: Tachy, regular GI: BS absent, mildly distended Derm: cyanotic Neuro: no response to external stimuli  Impression/Plan: S/P arrest, now with anoxic brain injury, actively dying -continue comfort measures with mouth care, fentanyl -family desires palliative medicine consulted, they were consulted by RN, at this point I don't expect her to survive through the day  Seizures: due to anoxic brain injury -continue ativan gtt  Roselie Awkward, MD [facility] Pager: [phone] Cell: [phone] After 3pm or if no response, call [phone]

## 2015-06-19 NOTE — Discharge Summary (Signed)
Patient ID: SELAM PIETSCH [id number] y.o. 1946-08-22  05/31/2015  Discharge date and time: Jul 05, 2015 11:30 AM  Admitting Physician: Rosario Adie  Discharge Physician: Rosario Adie.  Admission Diagnoses: parastomal hernia  Discharge Diagnoses: anoxia  Operations: Procedure(s): ATTEMPTED LAPAROSCOPIC COLOSTOMY TAKEDOWN    Discharged Condition: deceased    Hospital Course: Patient admitted after hypoxic event and near arrest in the OR.  Admitted to ICU and critical care and cardiology consulted.  Patient developed seizure like activity overnight.  She was placed on Keppra and propofol gtt was titrated.  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  The propofol was weaned 48 hrs later and she began to develop seizure activity again.  At this time her daughter made the decision to withdraw care.  The patient was extubated and Palliative care was consulted.  The patient expired ~36 hours later.    Consults: see above  Significant Diagnostic Studies: labs: cbc, chemistries, radiology: CT scan: no obvious causes of arrest were identified and Ultrasound: Duplex neg for DVT and cardiac graphics: Echocardiogram: mild R heart strain, no sign of cardiac dysfunction  Treatments: IV hydration and sedation and pain control  Disposition: deaceased

## 2015-06-20 ENCOUNTER — Telehealth: Payer: Self-pay | Admitting: Family Medicine

## 2015-06-20 NOTE — Telephone Encounter (Signed)
Left message on Brianna Duncan's voicemail expressing my condolences on her mother's passing. Will try to reach her again when I am in the office next week, but advised to call us if there is anything we can do to help the family at this time.

## 2015-06-29 NOTE — Progress Notes (Signed)
Patient passed away earlier this am.  Offered support and active listening to daughter Ambert Virrueta present at the bedside.  Brief life review performed, patient is described as a very energetic person, she loved going out with her friends.  Discussed about grief and bereavement, offered support.   15 minutes spent.  Loistine Chance, MD [facility] [phone]

## 2015-06-29 NOTE — Care Management Note (Signed)
Case Management Note  Patient Details  Name: Brianna Duncan MRN: [id number] Date of Birth: 11-11-1946  Subjective/Objective:                    Action/Plan:   Expected Discharge Date:       [id number]           Expected Discharge Plan:  Expired  In-House Referral:  Clinical Social Work  Discharge planning Services  CM Consult  Post Acute Care Choice:  NA Choice offered to:  NA  DME Arranged:    DME Agency:     HH Arranged:    [place] Agency:     Status of Service:  Completed, signed off  Medicare Important Message Given:    Date Medicare IM Given:    Medicare IM give by:    Date Additional Medicare IM Given:    Additional Medicare Important Message give by:     If discussed at [place] of Stay Meetings, dates discussed:    Additional Comments:  Leeroy Cha, RN 2015-07-04, 10:30 AM

## 2015-06-29 NOTE — Progress Notes (Signed)
Date:  June 28, 2015 U.R. performed for needs and level of care. Will continue to follow for Case Management needs.  Velva Harman, RN, BSN, [place]   [phone]

## 2015-06-29 NOTE — Progress Notes (Signed)
30mg  ativan gtt and 35mg  of diluadid gtt wasted down sink with Fredia Beets, RN

## 2015-06-29 DEATH — deceased

## 2015-07-03 ENCOUNTER — Other Ambulatory Visit: Payer: Self-pay | Admitting: Surgery

## 2015-07-19 ENCOUNTER — Other Ambulatory Visit: Payer: Self-pay | Admitting: Family Medicine

## 2015-07-27 ENCOUNTER — Ambulatory Visit: Payer: Medicare Other | Admitting: Family Medicine

## 2015-08-12 ENCOUNTER — Ambulatory Visit: Payer: No Typology Code available for payment source | Admitting: Internal Medicine

## 2015-12-23 IMAGING — CT CT ABD-PELV W/O CM
2 of 4 series · 14 of 46 positions shown, 16 images · non-contrast
Comparison: 05/11/2011 CT urogram.

CLINICAL DATA: Vomiting with diarrhea and chills.  Bloody stool.

EXAM:
CT ABDOMEN AND PELVIS WITHOUT CONTRAST
TECHNIQUE: Multidetector CT imaging of the abdomen and pelvis was performed
following the standard protocol without IV contrast.

[Series 2: abd/ pelvis 5.0 i30f 1 · axial · 0.72mm/px · z∈[+858,+1258]mm · 11 of 93 slices shown, 13 images]
[im 9/93  soft-tissue]
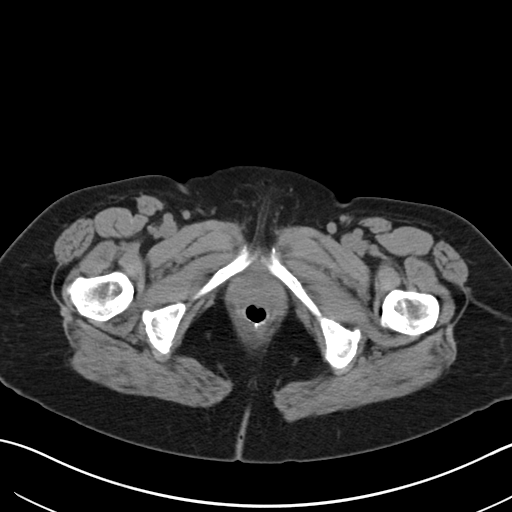
[im 9/93  bone]
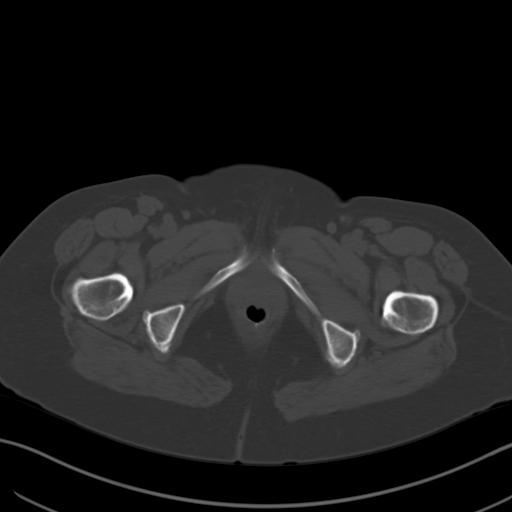
[im 17/93  soft-tissue]
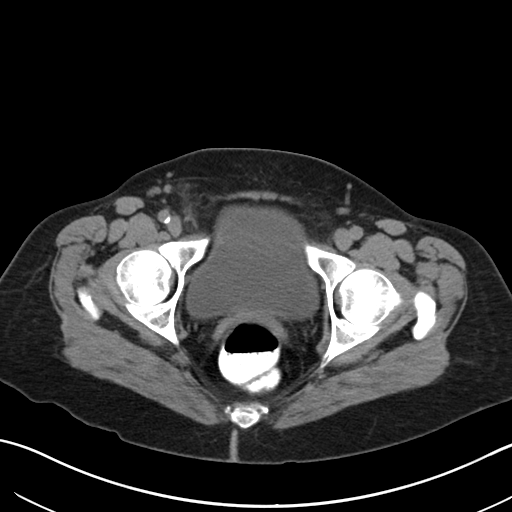
[im 25/93  soft-tissue]
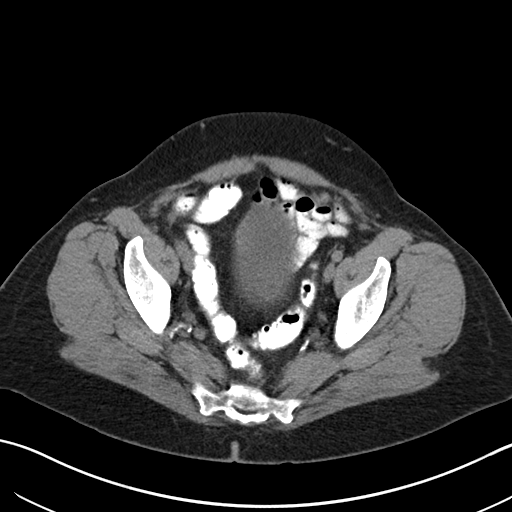
[im 33/93  soft-tissue]
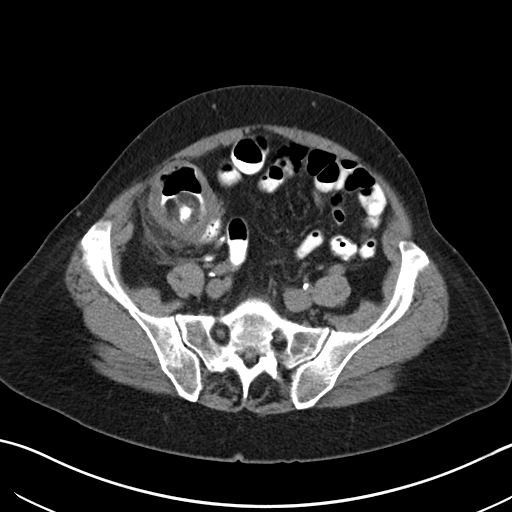
[im 41/93  soft-tissue]
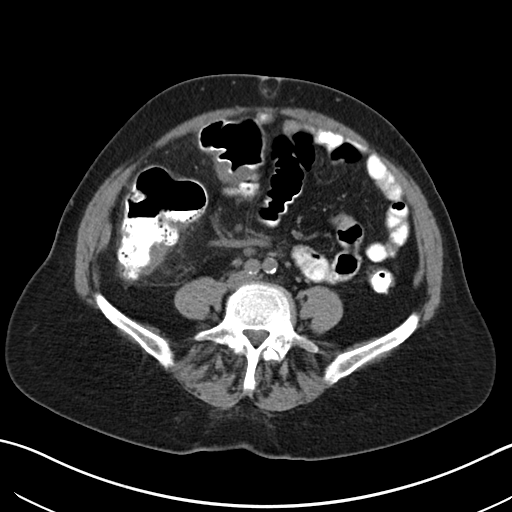
[im 49/93  soft-tissue]
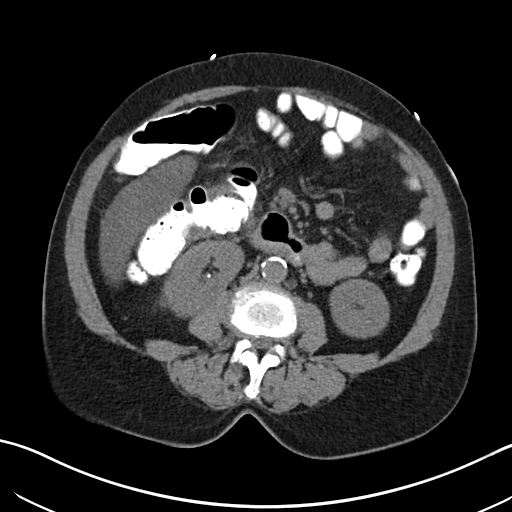
[im 57/93  soft-tissue]
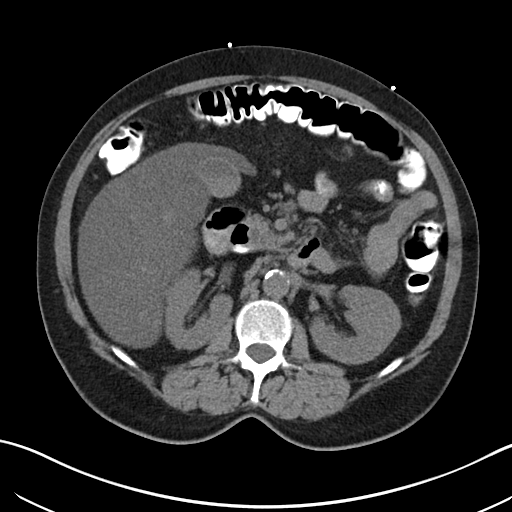
[im 65/93  soft-tissue]
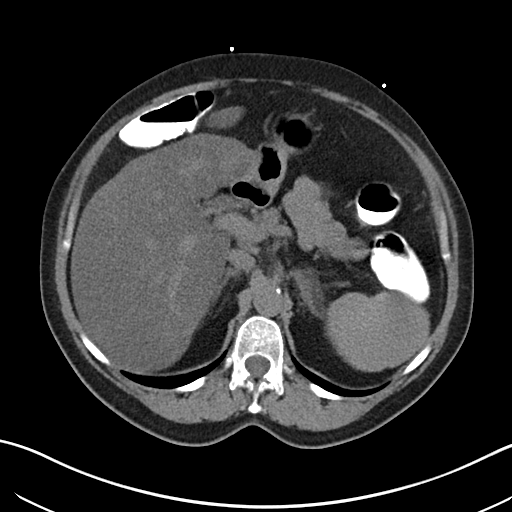
[im 73/93  soft-tissue]
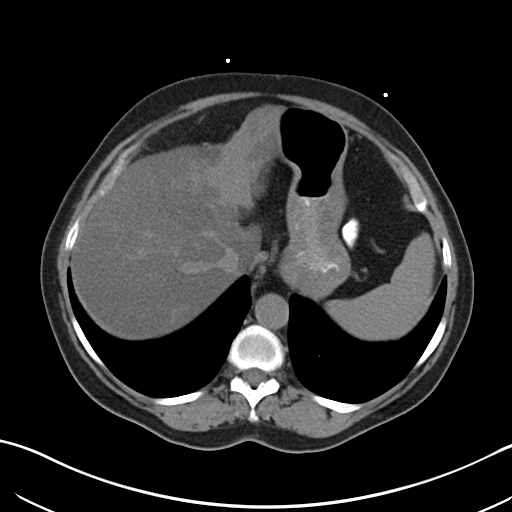
[im 73/93  bone]
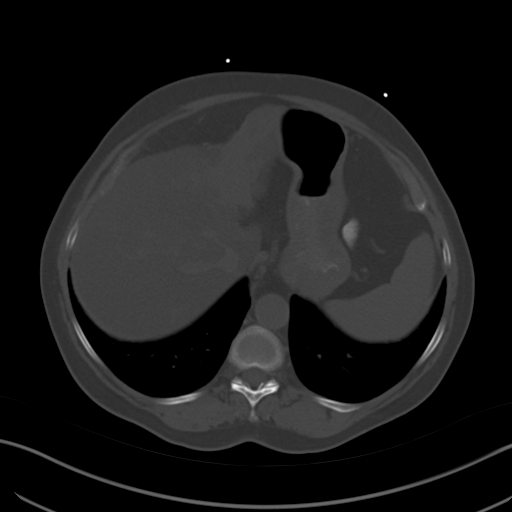
[im 81/93  soft-tissue]
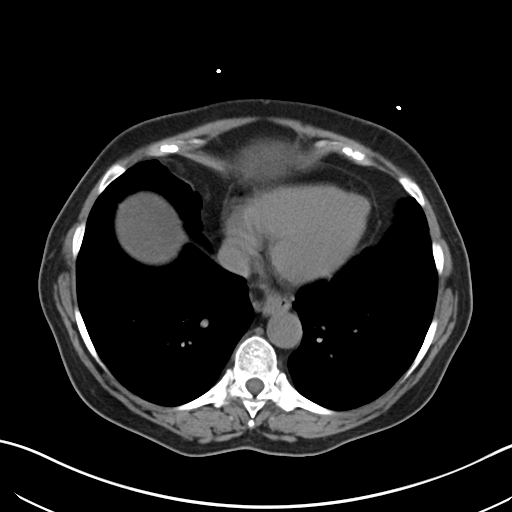
[im 89/93  soft-tissue]
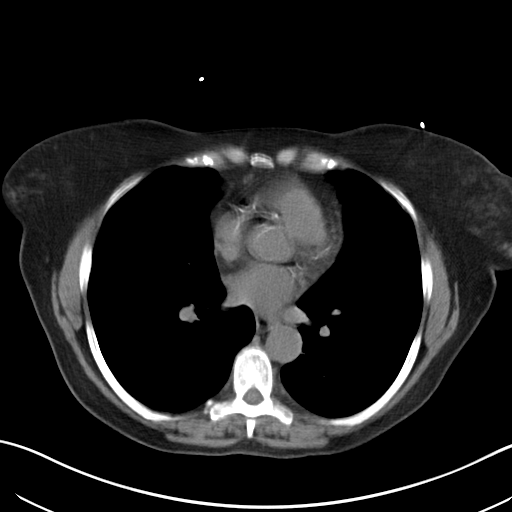

[Series 5: cor st · coronal · 0.90mm/px · 3 of 91 slices shown]
[im 31/91  soft-tissue]
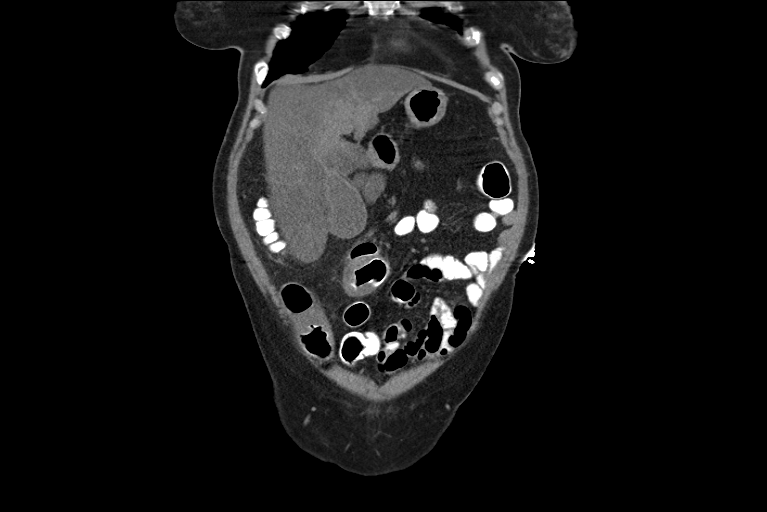
[im 41/91  soft-tissue]
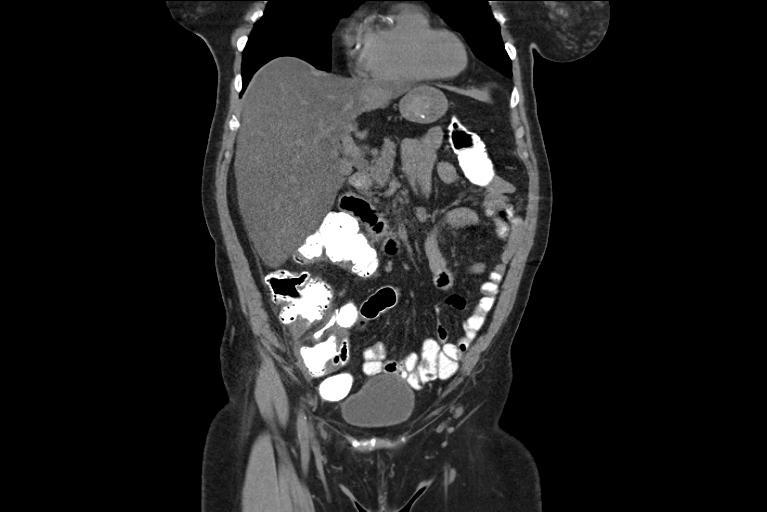
[im 51/91  soft-tissue]
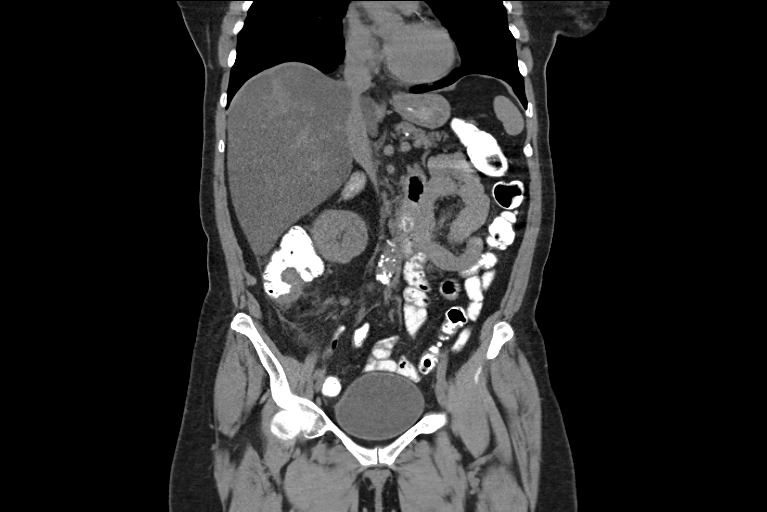

[14 of 46 positions shown; findings below may reference images not displayed]

FINDINGS: Lung bases: Clear. No pleural or pericardial effusion. Coronary
artery calcifications and a small hiatal hernia are noted.

Liver: There is new diffuse hepatic steatosis. There is new volume
loss within the left hepatic lobe. Within the contracted left
hepatic lobe, there is central increased density. There is suspicion
of underlying low-density lesions measuring 13 mm and 8 mm on image
24. In addition, there is a 1.2 cm low-density lesion posteriorly in
the dome of the right hepatic lobe on image 20.

Gallbladder/Biliary system: There is dependent high-density bile
within the gallbladder lumen as well as a small calcified gallstone.
There is no gallbladder wall thickening. There is mild extrahepatic
biliary dilatation.

Pancreas: Unremarkable.

Spleen: Normal in size without focal abnormality.

Adrenal glands: No adrenal mass.

Kidneys/Ureters/Bladder: There are small nonobstructing renal
calculi bilaterally, the largest in the lower pole of the left
kidney. There is no evidence of hydronephrosis, ureteral or bladder
calculus. There is no perinephric soft tissue stranding or other
focal renal abnormality.

Bowel: The stomach and small bowel appear unremarkable. There is
eccentric wall thickening within the cecum and right colon. There is
also focal eccentric wall thickening in the mid transverse colon
(axial image 50). Several other areas of nodular bowel wall
thickening are suspected in the colon. The appendix appears normal.
There is no evidence of bowel obstruction.

Peritoneum: No ascites or peritoneal nodularity.

Lymph Nodes: There are small ileocolonic mesenteric lymph nodes.

Vascular Structures: There is atherosclerosis of the aorta, its
branches and the iliac arteries. There is some distortion of the
portal and hepatic veins without occlusion or definite intravascular
tumor.

Reproductive Organs: The uterus is surgically absent. There is no
adnexal mass.

Abdominal wall: There is a small umbilical hernia. There is also a
small amount of fluid in the right groin medial to the femoral
vessels, suspicious for fluid within a small hernia.

Musculoskeletal: Degenerative changes are present throughout the
lower lumbar spine. There is a chronic appearing superior endplate
Schmorl's node at T8. No worrisome osseous findings demonstrated.
IMPRESSION: 1. Interval change in the appearance of the liver with contraction
of the left lobe and multiple low density lesions. Findings are
worrisome for malignancy, possibly metastatic disease given the
associated colonic findings. Alternatively, the findings could be
due to multicentric hepatocellular carcinoma.
2. New multicentric irregular colonic wall thickening, primarily
within the right and transverse colon. Appearance is not typical for
multifocal colitis, and colon cancer is suspected. Endoscopic
evaluation recommended. No evidence of bowel obstruction or
perforation.
3. Nonobstructing bilateral renal calculi.
